# Patient Record
Sex: Female | Born: 1990
Health system: Southern US, Community
[De-identification: ages and names within clinical notes are randomized; demographics above are authoritative.]

## PROBLEM LIST (undated history)

## (undated) DIAGNOSIS — E1065 Type 1 diabetes mellitus with hyperglycemia: Secondary | ICD-10-CM

## (undated) DIAGNOSIS — Z8669 Personal history of other diseases of the nervous system and sense organs: Secondary | ICD-10-CM

## (undated) DIAGNOSIS — F329 Major depressive disorder, single episode, unspecified: Secondary | ICD-10-CM

## (undated) DIAGNOSIS — R296 Repeated falls: Secondary | ICD-10-CM

## (undated) DIAGNOSIS — K760 Fatty (change of) liver, not elsewhere classified: Secondary | ICD-10-CM

## (undated) DIAGNOSIS — G629 Polyneuropathy, unspecified: Secondary | ICD-10-CM

## (undated) DIAGNOSIS — B009 Herpesviral infection, unspecified: Secondary | ICD-10-CM

## (undated) DIAGNOSIS — L089 Local infection of the skin and subcutaneous tissue, unspecified: Secondary | ICD-10-CM

## (undated) DIAGNOSIS — G4700 Insomnia, unspecified: Secondary | ICD-10-CM

## (undated) DIAGNOSIS — E1140 Type 2 diabetes mellitus with diabetic neuropathy, unspecified: Secondary | ICD-10-CM

## (undated) DIAGNOSIS — M21969 Unspecified acquired deformity of unspecified lower leg: Secondary | ICD-10-CM

## (undated) DIAGNOSIS — M25561 Pain in right knee: Secondary | ICD-10-CM

## (undated) DIAGNOSIS — E785 Hyperlipidemia, unspecified: Secondary | ICD-10-CM

## (undated) DIAGNOSIS — F419 Anxiety disorder, unspecified: Secondary | ICD-10-CM

## (undated) DIAGNOSIS — G8929 Other chronic pain: Secondary | ICD-10-CM

## (undated) HISTORY — DX: Herpesviral infection, unspecified: B00.9

## (undated) HISTORY — DX: Type 2 diabetes mellitus with diabetic neuropathy, unspecified: E11.40

## (undated) HISTORY — PX: LEEP: SHX91

## (undated) HISTORY — DX: Polyneuropathy, unspecified: G62.9

## (undated) HISTORY — DX: Insomnia, unspecified: G47.00

## (undated) HISTORY — DX: Major depressive disorder, single episode, unspecified: F32.9

## (undated) HISTORY — DX: Type 1 diabetes mellitus with hyperglycemia: E10.65

## (undated) HISTORY — DX: Hyperlipidemia, unspecified: E78.5

## (undated) HISTORY — DX: Anxiety disorder, unspecified: F41.9

## (undated) HISTORY — DX: Local infection of the skin and subcutaneous tissue, unspecified: L08.9

## (undated) HISTORY — DX: Personal history of other diseases of the nervous system and sense organs: Z86.69

## (undated) HISTORY — DX: Repeated falls: R29.6

---
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
## 2011-03-01 ENCOUNTER — Encounter (HOSPITAL_COMMUNITY): Payer: Medicaid Other | Admitting: Psychiatry

## 2011-11-01 ENCOUNTER — Encounter (HOSPITAL_COMMUNITY): Payer: Self-pay | Admitting: *Deleted

## 2011-11-01 ENCOUNTER — Emergency Department (HOSPITAL_COMMUNITY)
Admission: EM | Admit: 2011-11-01 | Discharge: 2011-11-01 | Disposition: A | Discharge status: Home / Self Care | Payer: Self-pay | Attending: Emergency Medicine | Admitting: Emergency Medicine

## 2011-11-01 DIAGNOSIS — E7800 Pure hypercholesterolemia, unspecified: Secondary | ICD-10-CM | POA: Insufficient documentation

## 2011-11-01 DIAGNOSIS — R21 Rash and other nonspecific skin eruption: Secondary | ICD-10-CM | POA: Insufficient documentation

## 2011-11-01 DIAGNOSIS — Z79899 Other long term (current) drug therapy: Secondary | ICD-10-CM | POA: Insufficient documentation

## 2011-11-01 DIAGNOSIS — Z794 Long term (current) use of insulin: Secondary | ICD-10-CM | POA: Insufficient documentation

## 2011-11-01 DIAGNOSIS — L989 Disorder of the skin and subcutaneous tissue, unspecified: Secondary | ICD-10-CM | POA: Insufficient documentation

## 2011-11-01 DIAGNOSIS — E119 Type 2 diabetes mellitus without complications: Secondary | ICD-10-CM | POA: Insufficient documentation

## 2011-11-01 HISTORY — DX: Fatty (change of) liver, not elsewhere classified: K76.0

## 2011-11-01 HISTORY — DX: Unspecified acquired deformity of unspecified lower leg: M21.969

## 2011-11-01 LAB — COMPREHENSIVE METABOLIC PANEL
ALT: 26 U/L (ref 0–35)
AST: 24 U/L (ref 0–37)
Calcium: 9.2 mg/dL (ref 8.4–10.5)
Potassium: 3.8 mEq/L (ref 3.5–5.1)
Sodium: 135 mEq/L (ref 135–145)
Total Protein: 7.3 g/dL (ref 6.0–8.3)

## 2011-11-01 LAB — GLUCOSE, CAPILLARY: Glucose-Capillary: 111 mg/dL — ABNORMAL HIGH (ref 70–99)

## 2011-11-01 MED ORDER — MINOCYCLINE HCL 100 MG PO CAPS: 100.0000 mg | ORAL_CAPSULE | Freq: Two times a day (BID) | ORAL | Status: DC

## 2011-11-01 NOTE — Discharge Instructions (Signed)
Rash A rash is a change in the color or texture of your skin. There are many different types of rashes. You may have other problems that accompany your rash. CAUSES   Infections.   Allergic reactions. This can include allergies to pets or foods.   Certain medicines.   Exposure to certain chemicals, soaps, or cosmetics.   Heat.   Exposure to poisonous plants.   Tumors, both cancerous and noncancerous.  SYMPTOMS   Redness.   Scaly skin.   Itchy skin.   Dry or cracked skin.   Bumps.   Blisters.   Pain.  DIAGNOSIS  Your caregiver may do a physical exam to determine what type of rash you have. A skin sample (biopsy) may be taken and examined under a microscope. TREATMENT  Treatment depends on the type of rash you have. Your caregiver may prescribe certain medicines. For serious conditions, you may need to see a skin doctor (dermatologist). HOME CARE INSTRUCTIONS   Avoid the substance that caused your rash.   Do not scratch your rash. This can cause infection.   You may take cool baths to help stop itching.   Only take over-the-counter or prescription medicines as directed by your caregiver.   Keep all follow-up appointments as directed by your caregiver.  SEEK IMMEDIATE MEDICAL CARE IF:  You have increasing pain, swelling, or redness.   You have a fever.   You have new or severe symptoms.   You have body aches, diarrhea, or vomiting.   Your rash is not better after 3 days.  MAKE SURE YOU:  Understand these instructions.   Will watch your condition.   Will get help right away if you are not doing well or get worse.  Document Released: 08/03/2002 Document Revised: 08/02/2011 Document Reviewed: 05/28/2011 [employer] Patient Information 2012 [employer]. 

## 2011-11-01 NOTE — ED Notes (Addendum)
Pt has diabetes and skin lesions covering body  Time line -2 years ago skin lesions started on legs. Sometimes lesions it, normally just scab, no pus, and then heal. -1.5 years ago treated for DKA.  Currently AIC 11% (checked Jan 2013), pt checks blood sugar at least 5 times daily -1.5 years ago pt went to wound care center, one leg treated with uma boot. Lesions started to heal/ get smaller, but new ones still showed up. So pt referred to dermatologist, dermatologist took scab scrapings, on one sample staff noted, but no other treatment.  -Sept 2012 pt liver biopsied, dx with fatty liver, pt was on cholesterol medicine, but taken off due to fatty liver -Since last 2 years lesions spread to arms -2 months ago skin lesions on face noted -x3 weeks pt has had mid epigastric pain at night that wakes her up from sleep and radiates to right. Only relieved when pt makes herself throw up -Last 2 days pt has had continuous back of head/upper neck pain. At times pain 10/10, currently 2/10. Pain has been constant with no relief, sometimes pain radiates to behind ears.   Skin lesions red and scabbed over, no pus or swelling noted.   Pt currently does not have insurance and so has had difficulty paying for treatment.

## 2011-11-01 NOTE — Progress Notes (Signed)
CM contacted by [facility] staff to discuss resources for medications, local self pcps, Mother states pt not eligible for medicaid since turned 12 but  Continues to work with DSS on resources.  Pt per d/c summary scheduled to see a dermatologist. CM encouraged mother to check with the dermatology office billing prior to pt appointment.  Cm reviewed [email or web address], local discount pharmacies, discounted gluco meters, [facility] outpatient pharmacy, local financial assistance resources and other [place] resources. Pt and mother voiced understanding and appreciation for services offered.. Pt listed as self pay with no insurance coverage Pt confirms she is self pay [place] resident.  CM and [facility] coordinator spoke with her Pt offered [facility] services to assist with finding a [place] self pay provider

## 2011-11-01 NOTE — ED Provider Notes (Signed)
History     CSN: [id number]  Arrival date & time 11/01/11  0919   First MD Initiated Contact with Patient 11/01/11 1017      Chief Complaint  Patient presents with  . Diabetes  . Skin Ulcer    skin lesions covering body    (Consider location/radiation/quality/duration/timing/severity/associated sxs/prior treatment) HPI  Patient presents to the ED with complaints of rash. The rash started two years ago on bilateral lower extremities, which she has seen a Wound Care doctor and dermatologist for. They tried " a couple of things" and the rash got better but never completely went away, then she lost her insurance. The patient describes the symptoms as progressively spreading. They then spread to her arms, and within the past week she has three spots on her face. The rash spares the bottom of her feet, calfs, buttocks, genitalia, back, chest, neck, right arm. She denies systemic symptoms of fever, chills, weakness, vaginal odor or discharge. Pt has PMH positive for diabetes.  Past Medical History  Diagnosis Date  . Diabetes mellitus   . High cholesterol   . Foot deformity, acquired     little toes, toes nail are falling off, needs surgery, but cant happen till AIC < 9  . Fatty liver   . Skin lesion     Past Surgical History  Procedure Date  . Wrist surgery     rerouted tendons in wrist    History reviewed. No pertinent family history.  History  Substance Use Topics  . Smoking status: Never Smoker   . Smokeless tobacco: Not on file  . Alcohol Use: No    OB History    Grav Para Term Preterm Abortions TAB SAB Ect Mult Living                  Review of Systems  All other systems reviewed and are negative.    Allergies  Penicillins  Home Medications   Current Outpatient Rx  Name Route Sig Dispense Refill  . ESOMEPRAZOLE MAGNESIUM 40 MG PO CPDR Oral Take 40 mg by mouth daily before breakfast.    . IBUPROFEN 200 MG PO TABS Oral Take 400 mg by mouth every 6 (six)  hours as needed. For pain    . INSULIN ASPART 100 UNIT/ML [place] SOLN Subcutaneous Inject 1-3 Units into the skin 3 (three) times daily before meals. Sliding scale using 1-3 units with each meal    . INSULIN GLARGINE 100 UNIT/ML [place] SOLN Subcutaneous Inject 50 Units into the skin at bedtime.    Marland Kitchen BACITRACIN-NEOMYCIN-POLYMYXIN [id number] EX OINT Topical Apply 1 application topically every 8 (eight) hours. Apply to affected areas on arms    . NORGESTREL-ETHINYL ESTRADIOL 0.3-30 MG-MCG PO TABS Oral Take 1 tablet by mouth daily.    . SERTRALINE HCL 100 MG PO TABS Oral Take 100 mg by mouth daily.    Marland Kitchen ZOLPIDEM TARTRATE 5 MG PO TABS Oral Take 5 mg by mouth at bedtime as needed.    Marland Kitchen MINOCYCLINE HCL 100 MG PO CAPS Oral Take 1 capsule (100 mg total) by mouth 2 (two) times daily. 28 capsule 0    BP 132/90  Pulse 109  Temp(Src) 98.5 F (36.9 C) (Oral)  Resp 16  SpO2 99%  LMP 09/23/2011  Physical Exam  Nursing note and vitals reviewed. Constitutional: She appears well-developed and well-nourished. No distress.  HENT:  Head: Normocephalic and atraumatic.    Eyes: Pupils are equal, round, and reactive to light.  Neck:  Normal range of motion. Neck supple.  Cardiovascular: Normal rate and regular rhythm.   Pulmonary/Chest: Effort normal.  Abdominal: Soft.  Musculoskeletal:       Arms:      Legs:      Lesions are scabbed and individual and non coalescing. Non itchy, slightly painful, many scars noted to areas where scabs have healed.  Neurological: She is alert.  Skin: Skin is warm and dry.    ED Course  Procedures (including critical care time)  Labs Reviewed  GLUCOSE, CAPILLARY - Abnormal; Notable for the following:    Glucose-Capillary 111 (*)    All other components within normal limits  POCT PREGNANCY, URINE  COMPREHENSIVE METABOLIC PANEL   No results found.   1. Rash       MDM  Pts rash possibly infectious etiology. Will try treating with minocycline 100mg  q 12 hours for 2  weeks. I did urine preg and CMP to chest patients hepatic and renal function. Patient and mother have been informed that their is a change this treatment may not work and that something else may need to be tried.       Dorthula Matas, PA 11/01/11 1946

## 2011-11-02 NOTE — ED Provider Notes (Signed)
Medical screening examination/treatment/procedure(s) were performed by non-physician practitioner and as supervising physician I was immediately available for consultation/collaboration.   Jacquelynne Guedes A. Patrica Duel, MD 11/02/11 1454

## 2011-11-30 ENCOUNTER — Emergency Department (HOSPITAL_COMMUNITY)
Admission: EM | Admit: 2011-11-30 | Discharge: 2011-11-30 | Disposition: A | Discharge status: Home / Self Care | Payer: Self-pay | Attending: Emergency Medicine | Admitting: Emergency Medicine

## 2011-11-30 ENCOUNTER — Encounter (HOSPITAL_COMMUNITY): Payer: Self-pay | Admitting: *Deleted

## 2011-11-30 DIAGNOSIS — L2089 Other atopic dermatitis: Secondary | ICD-10-CM | POA: Insufficient documentation

## 2011-11-30 DIAGNOSIS — E108 Type 1 diabetes mellitus with unspecified complications: Secondary | ICD-10-CM | POA: Insufficient documentation

## 2011-11-30 DIAGNOSIS — E7800 Pure hypercholesterolemia, unspecified: Secondary | ICD-10-CM | POA: Insufficient documentation

## 2011-11-30 DIAGNOSIS — R739 Hyperglycemia, unspecified: Secondary | ICD-10-CM

## 2011-11-30 DIAGNOSIS — Z794 Long term (current) use of insulin: Secondary | ICD-10-CM | POA: Insufficient documentation

## 2011-11-30 DIAGNOSIS — L209 Atopic dermatitis, unspecified: Secondary | ICD-10-CM

## 2011-11-30 LAB — GLUCOSE, CAPILLARY

## 2011-11-30 LAB — URINALYSIS, ROUTINE W REFLEX MICROSCOPIC
Bilirubin Urine: NEGATIVE
Nitrite: NEGATIVE
Specific Gravity, Urine: 1.039 — ABNORMAL HIGH (ref 1.005–1.030)
pH: 5.5 (ref 5.0–8.0)

## 2011-11-30 LAB — POCT I-STAT, CHEM 8
Creatinine, Ser: 0.6 mg/dL (ref 0.50–1.10)
Hemoglobin: 16.3 g/dL — ABNORMAL HIGH (ref 12.0–15.0)
Potassium: 3.7 mEq/L (ref 3.5–5.1)
Sodium: 135 mEq/L (ref 135–145)

## 2011-11-30 LAB — CBC
HCT: 44 % (ref 36.0–46.0)
Hemoglobin: 16 g/dL — ABNORMAL HIGH (ref 12.0–15.0)
RBC: 5.01 MIL/uL (ref 3.87–5.11)
WBC: 9.5 10*3/uL (ref 4.0–10.5)

## 2011-11-30 LAB — DIFFERENTIAL
Basophils Absolute: 0.1 10*3/uL (ref 0.0–0.1)
Lymphocytes Relative: 33 % (ref 12–46)
Lymphs Abs: 3.1 10*3/uL (ref 0.7–4.0)
Monocytes Absolute: 0.5 10*3/uL (ref 0.1–1.0)
Monocytes Relative: 6 % (ref 3–12)
Neutro Abs: 5.3 10*3/uL (ref 1.7–7.7)

## 2011-11-30 LAB — URINE MICROSCOPIC-ADD ON

## 2011-11-30 MED ORDER — MINOCYCLINE HCL 100 MG PO CAPS: 100.0000 mg | ORAL_CAPSULE | Freq: Two times a day (BID) | ORAL | Status: DC

## 2011-11-30 MED ORDER — ONDANSETRON HCL 4 MG/2ML IJ SOLN
4.0000 mg | Freq: Once | INTRAMUSCULAR | Status: AC
  Administered 2011-11-30: 4 mg via INTRAVENOUS
  Filled 2011-11-30: qty 2

## 2011-11-30 MED ORDER — SODIUM CHLORIDE 0.9 % IV BOLUS (SEPSIS)
1000.0000 mL | Freq: Once | INTRAVENOUS | Status: AC
  Administered 2011-11-30: 1000 mL via INTRAVENOUS

## 2011-11-30 NOTE — ED Provider Notes (Signed)
Medical screening examination/treatment/procedure(s) were conducted as a shared visit with non-physician practitioner(s) and myself.  I personally evaluated the patient during the encounter Pt is a 21 year old woman who is diabetic.  She came in with hyperglycemia, which was treated with IV fluids and insulin, with improvement of her blood sugar into the low 200's.  She has a rash scattered over her body, including the face and scale, that is dry with some scale on it.  I cultured one of these lesions.  Pt placed on minocycline 100 mg bid x 1 month for suspected impetigo. She is going to followup in the [facility].  Carleene Cooper III, MD 11/30/11 2126

## 2011-11-30 NOTE — Discharge Instructions (Signed)
Eczema Atopic dermatitis, or eczema, is an inherited type of sensitive skin. Often people with eczema have a family history of allergies, asthma, or hay fever. It causes a red itchy rash and dry scaly skin. The itchiness may occur before the skin rash and may be very intense. It is not contagious. Eczema is generally worse during the cooler winter months and often improves with the warmth of summer. Eczema usually starts showing signs in infancy. Some children outgrow eczema, but it may last through adulthood. Flare-ups may be caused by:  Eating something or contact with something you are sensitive or allergic to.   Stress.  DIAGNOSIS  The diagnosis of eczema is usually based upon symptoms and medical history. TREATMENT  Eczema cannot be cured, but symptoms usually can be controlled with treatment or avoidance of allergens (things to which you are sensitive or allergic to).  Controlling the itching and scratching.   Use over-the-counter antihistamines as directed for itching. It is especially useful at night when the itching tends to be worse.   Use over-the-counter steroid creams as directed for itching.   Scratching makes the rash and itching worse and may cause impetigo (a skin infection) if fingernails are contaminated (dirty).   Keeping the skin well moisturized with creams every day. This will seal in moisture and help prevent dryness. Lotions containing alcohol and water can dry the skin and are not recommended.   Limiting exposure to allergens.   Recognizing situations that cause stress.   Developing a plan to manage stress.  HOME CARE INSTRUCTIONS   Take prescription and over-the-counter medicines as directed by your caregiver.   Do not use anything on the skin without checking with your caregiver.   Keep baths or showers short (5 minutes) in warm (not hot) water. Use mild cleansers for bathing. You may add non-perfumed bath oil to the bath water. It is best to avoid soap and  bubble bath.   Immediately after a bath or shower, when the skin is still damp, apply a moisturizing ointment to the entire body. This ointment should be a petroleum ointment. This will seal in moisture and help prevent dryness. The thicker the ointment the better. These should be unscented.   Keep fingernails cut short and wash hands often. If your child has eczema, it may be necessary to put soft gloves or mittens on your child at night.   Dress in clothes made of cotton or cotton blends. Dress lightly, as heat increases itching.   Avoid foods that may cause flare-ups. Common foods include cow's milk, peanut butter, eggs and wheat.   Keep a child with eczema away from anyone with fever blisters. The virus that causes fever blisters (herpes simplex) can cause a serious skin infection in children with eczema.  SEEK MEDICAL CARE IF:   Itching interferes with sleep.   The rash gets worse or is not better within one week following treatment.   The rash looks infected (pus or soft yellow scabs).   You or your child has an oral temperature above 102 F (38.9 C).   Your baby is older than 3 months with a rectal temperature of 100.5 F (38.1 C) or higher for more than 1 day.   The rash flares up after contact with someone who has fever blisters.  SEEK IMMEDIATE MEDICAL CARE IF:   Your baby is older than 3 months with a rectal temperature of 102 F (38.9 C) or higher.   Your baby is older than  3 months or younger with a rectal temperature of 100.4 F (38 C) or higher.  Document Released: 08/10/2000 Document Revised: 08/02/2011 Document Reviewed: 06/15/2009 [facility] Patient Information 2012 [place], [place].Hyperglycemia Hyperglycemia occurs when the glucose (sugar) in your blood is too high. Hyperglycemia can happen for many reasons, but it most often happens to people who do not know they have diabetes or are not managing their diabetes properly.  CAUSES  Whether you have diabetes or  not, there are other causes of hyperglycemia. Hyperglycemia can occur when you have diabetes, but it can also occur in other situations that you might not be as aware of, such as: Diabetes  If you have diabetes and are having problems controlling your blood glucose, hyperglycemia could occur because of some of the following reasons:   Not following your meal plan.   Not taking your diabetes medications or not taking it properly.   Exercising less or doing less activity than you normally do.   Being sick.  Pre-diabetes  This cannot be ignored. Before people develop Type 2 diabetes, they almost always have "pre-diabetes." This is when your blood glucose levels are higher than normal, but not yet high enough to be diagnosed as diabetes. Research has shown that some long-term damage to the body, especially the heart and circulatory system, may already be occurring during pre-diabetes. If you take action to manage your blood glucose when you have pre-diabetes, you may delay or prevent Type 2 diabetes from developing.  Stress  If you have diabetes, you may be "diet" controlled or on oral medications or insulin to control your diabetes. However, you may find that your blood glucose is higher than usual in the hospital whether you have diabetes or not. This is often referred to as "stress hyperglycemia." Stress can elevate your blood glucose. This happens because of hormones put out by the body during times of stress. If stress has been the cause of your high blood glucose, it can be followed regularly by your caregiver. That way he/she can make sure your hyperglycemia does not continue to get worse or progress to diabetes.  Steroids  Steroids are medications that act on the infection fighting system (immune system) to block inflammation or infection. One side effect can be a rise in blood glucose. Most people can produce enough extra insulin to allow for this rise, but for those who cannot, steroids  make blood glucose levels go even higher. It is not unusual for steroid treatments to "uncover" diabetes that is developing. It is not always possible to determine if the hyperglycemia will go away after the steroids are stopped. A special blood test called an A1c is sometimes done to determine if your blood glucose was elevated before the steroids were started.  SYMPTOMS  Thirsty.   Frequent urination.   Dry mouth.   Blurred vision.   Tired or fatigue.   Weakness.   Sleepy.   Tingling in feet or leg.  DIAGNOSIS  Diagnosis is made by monitoring blood glucose in one or all of the following ways:  A1c test. This is a chemical found in your blood.   Fingerstick blood glucose monitoring.   Laboratory results.  TREATMENT  First, knowing the cause of the hyperglycemia is important before the hyperglycemia can be treated. Treatment may include, but is not be limited to:  Education.   Change or adjustment in medications.   Change or adjustment in meal plan.   Treatment for an illness, infection, etc.   More  frequent blood glucose monitoring.   Change in exercise plan.   Decreasing or stopping steroids.   Lifestyle changes.  HOME CARE INSTRUCTIONS   Test your blood glucose as directed.   Exercise regularly. Your caregiver will give you instructions about exercise. Pre-diabetes or diabetes which comes on with stress is helped by exercising.   Eat wholesome, balanced meals. Eat often and at regular, fixed times. Your caregiver or nutritionist will give you a meal plan to guide your sugar intake.   Being at an ideal weight is important. If needed, losing as little as 10 to 15 pounds may help improve blood glucose levels.  SEEK MEDICAL CARE IF:   You have questions about medicine, activity, or diet.   You continue to have symptoms (problems such as increased thirst, urination, or weight gain).  SEEK IMMEDIATE MEDICAL CARE IF:   You are vomiting or have diarrhea.    Your breath smells fruity.   You are breathing faster or slower.   You are very sleepy or incoherent.   You have numbness, tingling, or pain in your feet or hands.   You have chest pain.   Your symptoms get worse even though you have been following your caregiver's orders.   If you have any other questions or concerns.  Document Released: 02/06/2001 Document Revised: 08/02/2011 Document Reviewed: 04/04/2009 [facility] Patient Information 2012 [place], [place].

## 2011-11-30 NOTE — ED Notes (Signed)
To ED for eval of high blood sugars and an ongoing outbreak of lesions to body. Dry mouth, excessively tired, freq urination. Pt is soon starting as a pt with [facility], has appt in may.

## 2011-11-30 NOTE — ED Notes (Signed)
Pt. Is aware of needing urine specimen.   

## 2011-11-30 NOTE — ED Provider Notes (Signed)
History     CSN: [id number]  Arrival date & time 11/30/11  1001   First MD Initiated Contact with Patient 11/30/11 1032      Chief Complaint  Patient presents with  . Hyperglycemia    (Consider location/radiation/quality/duration/timing/severity/associated sxs/prior treatment) HPI Comments: Patient here with mother with several complaints - reports very brittle diabetic who is currently discharged from her PCP but has been accepted at the [facility] and has an appointment with them in May - states that blood sugar was >500 this morning - she took 13 units of novolog to cover this.  Reports dry mouth and nausea, denies vomiting or abdominal pain.  Reports fatique and excessive thirst and urination recently.  States HgbA1C has never been below 9.3.  Also with complaints of skin rash - has been going on for the past 2 years - mother states they have been to multiple providers without a real diagnosis.  Is here with worsening pain to the right arm with redness and mother is concerned that the plaque lesions that itch are infected which they have been in the past - denies fever, chills, drainage.  Patient is a 21 y.o. female presenting with diabetes problem. The history is provided by a parent and the patient. No language interpreter was used.  Diabetes She presents for her follow-up diabetic visit. She has type 1 diabetes mellitus. No MedicAlert identification noted. Her disease course has been fluctuating. There are no hypoglycemic associated symptoms. Associated symptoms include fatigue, polydipsia, polyuria and weakness. Pertinent negatives for diabetes include no blurred vision, no chest pain, no foot paresthesias, no foot ulcerations, no polyphagia, no visual change and no weight loss. There are no hypoglycemic complications. Symptoms are worsening. Diabetic complications include peripheral neuropathy. Pertinent negatives for diabetic complications include no autonomic neuropathy, CVA, heart disease,  impotence, nephropathy, PVD or retinopathy. Risk factors for coronary artery disease include diabetes mellitus, dyslipidemia and obesity. Current diabetic treatment includes insulin injections. She is compliant with treatment all of the time. Her weight is stable. She is following a generally healthy diet. When asked about meal planning, she reported none. She has not had a previous visit with a dietician. She participates in exercise intermittently. Her home blood glucose trend is increasing rapidly. Her breakfast blood glucose is taken between 8-9 am. Her breakfast blood glucose range is generally >200 mg/dl. She does not see a podiatrist.Eye exam is not current.    Past Medical History  Diagnosis Date  . Diabetes mellitus   . High cholesterol   . Foot deformity, acquired     little toes, toes nail are falling off, needs surgery, but cant happen till AIC < 9  . Fatty liver   . Skin lesion     Past Surgical History  Procedure Date  . Wrist surgery     rerouted tendons in wrist    History reviewed. No pertinent family history.  History  Substance Use Topics  . Smoking status: Never Smoker   . Smokeless tobacco: Not on file  . Alcohol Use: No    OB History    Grav Para Term Preterm Abortions TAB SAB Ect Mult Living                  Review of Systems  Constitutional: Positive for fatigue. Negative for fever, chills and weight loss.  HENT: Negative for ear pain and neck pain.   Eyes: Negative for blurred vision and pain.  Respiratory: Negative for chest tightness and shortness of  breath.   Cardiovascular: Negative for chest pain.  Gastrointestinal: Positive for nausea. Negative for vomiting, abdominal pain and diarrhea.  Genitourinary: Positive for polyuria and frequency. Negative for flank pain, impotence and difficulty urinating.  Skin: Positive for rash.  Neurological: Positive for weakness.  Hematological: Positive for polydipsia. Negative for polyphagia.  All other  systems reviewed and are negative.    Allergies  Penicillins  Home Medications   Current Outpatient Rx  Name Route Sig Dispense Refill  . ESOMEPRAZOLE MAGNESIUM 40 MG PO CPDR Oral Take 40 mg by mouth 2 (two) times daily.     . IBUPROFEN 200 MG PO TABS Oral Take 400 mg by mouth every 6 (six) hours as needed. For pain    . INSULIN ASPART 100 UNIT/ML [place] SOLN Subcutaneous Inject 1-3 Units into the skin 3 (three) times daily before meals. Sliding scale using 1-3 units with each meal    . INSULIN GLARGINE 100 UNIT/ML [place] SOLN Subcutaneous Inject 50 Units into the skin at bedtime.    Marland Kitchen MINOCYCLINE HCL 100 MG PO CAPS Oral Take 100 mg by mouth 2 (two) times daily.    Marland Kitchen BACITRACIN-NEOMYCIN-POLYMYXIN 400-12-4998 EX OINT Topical Apply 1 application topically at bedtime. Apply to affected areas on arms    . NORGESTREL-ETHINYL ESTRADIOL 0.3-30 MG-MCG PO TABS Oral Take 1 tablet by mouth daily.    Marland Kitchen OVER THE COUNTER MEDICATION Oral Take 1 tablet by mouth daily. Chromax Plus .    Marland Kitchen SERTRALINE HCL 100 MG PO TABS Oral Take 100 mg by mouth daily.    Marland Kitchen ZOLPIDEM TARTRATE 5 MG PO TABS Oral Take 5 mg by mouth at bedtime as needed. For insomnia.      BP 124/86  Pulse 124  Temp(Src) 97.4 F (36.3 C) (Oral)  Resp 18  SpO2 98%  LMP 09/23/2011  Physical Exam  Nursing note and vitals reviewed. Constitutional: She is oriented to person, place, and time. She appears well-developed and well-nourished. No distress.  HENT:  Head: Normocephalic and atraumatic.  Right Ear: External ear normal.  Left Ear: External ear normal.  Nose: Nose normal.  Mouth/Throat: No oropharyngeal exudate.       Dry mucous membranes  Eyes: Conjunctivae are normal. Pupils are equal, round, and reactive to light. No scleral icterus.  Neck: Normal range of motion. Neck supple.  Cardiovascular: Normal rate, regular rhythm and normal heart sounds.  Exam reveals no gallop and no friction rub.   No murmur heard. Pulmonary/Chest:  Effort normal and breath sounds normal. No respiratory distress. She has no wheezes. She has no rales. She exhibits no tenderness.  Abdominal: Soft. Bowel sounds are normal. She exhibits no distension and no mass. There is no tenderness. There is no rebound and no guarding.  Musculoskeletal: Normal range of motion. She exhibits no edema and no tenderness.  Lymphadenopathy:    She has no cervical adenopathy.  Neurological: She is alert and oriented to person, place, and time. No cranial nerve deficit.  Skin: Skin is warm and dry. Rash noted. There is erythema. No pallor.       Patient with multiple plaque like scabbed area to bilateral arms, face, bilateral legs and one lesion to back.    ED Course  Procedures (including critical care time)  Labs Reviewed  GLUCOSE, CAPILLARY - Abnormal; Notable for the following:    Glucose-Capillary 503 (*)    All other components within normal limits  CBC  DIFFERENTIAL  URINALYSIS, ROUTINE W REFLEX MICROSCOPIC  PREGNANCY,  URINE   No results found.  Results for orders placed during the hospital encounter of 11/30/11  GLUCOSE, CAPILLARY      Component Value Range   Glucose-Capillary 503 (*) 70 - 99 (mg/dL)   Comment 1 Notify RN    CBC      Component Value Range   WBC 9.5  4.0 - 10.5 (K/uL)   RBC 5.01  3.87 - 5.11 (MIL/uL)   Hemoglobin 16.0 (*) 12.0 - 15.0 (g/dL)   HCT 16.1  09.6 - 04.5 (%)   MCV 87.8  78.0 - 100.0 (fL)   MCH 31.9  26.0 - 34.0 (pg)   MCHC 36.4 (*) 30.0 - 36.0 (g/dL)   RDW 40.9  81.1 - 91.4 (%)   Platelets 288  150 - 400 (K/uL)  DIFFERENTIAL      Component Value Range   Neutrophils Relative 56  43 - 77 (%)   Neutro Abs 5.3  1.7 - 7.7 (K/uL)   Lymphocytes Relative 33  12 - 46 (%)   Lymphs Abs 3.1  0.7 - 4.0 (K/uL)   Monocytes Relative 6  3 - 12 (%)   Monocytes Absolute 0.5  0.1 - 1.0 (K/uL)   Eosinophils Relative 5  0 - 5 (%)   Eosinophils Absolute 0.5  0.0 - 0.7 (K/uL)   Basophils Relative 1  0 - 1 (%)   Basophils  Absolute 0.1  0.0 - 0.1 (K/uL)  URINALYSIS, ROUTINE W REFLEX MICROSCOPIC      Component Value Range   Color, Urine YELLOW  YELLOW    APPearance CLEAR  CLEAR    Specific Gravity, Urine 1.039 (*) 1.005 - 1.030    pH 5.5  5.0 - 8.0    Glucose, UA >1000 (*) NEGATIVE (mg/dL)   Hgb urine dipstick MODERATE (*) NEGATIVE    Bilirubin Urine NEGATIVE  NEGATIVE    Ketones, ur 40 (*) NEGATIVE (mg/dL)   Protein, ur NEGATIVE  NEGATIVE (mg/dL)   Urobilinogen, UA 0.2  0.0 - 1.0 (mg/dL)   Nitrite NEGATIVE  NEGATIVE    Leukocytes, UA NEGATIVE  NEGATIVE   PREGNANCY, URINE      Component Value Range   Preg Test, Ur NEGATIVE  NEGATIVE   POCT I-STAT, CHEM 8      Component Value Range   Sodium 135  135 - 145 (mEq/L)   Potassium 3.7  3.5 - 5.1 (mEq/L)   Chloride 103  96 - 112 (mEq/L)   BUN 9  6 - 23 (mg/dL)   Creatinine, Ser 7.82  0.50 - 1.10 (mg/dL)   Glucose, Bld 956 (*) 70 - 99 (mg/dL)   Calcium, Ion 2.13  0.86 - 1.32 (mmol/L)   TCO2 18  0 - 100 (mmol/L)   Hemoglobin 16.3 (*) 12.0 - 15.0 (g/dL)   HCT 57.8 (*) 46.9 - 46.0 (%)  GLUCOSE, CAPILLARY      Component Value Range   Glucose-Capillary 216 (*) 70 - 99 (mg/dL)  URINE MICROSCOPIC-ADD ON      Component Value Range   Squamous Epithelial / LPF FEW (*) RARE    WBC, UA 0-2  <3 (WBC/hpf)   Bacteria, UA RARE  RARE    No results found.   Ectopic Dermatitis Hyperglycemia    MDM  Blood sugar down to 216 after the administration of IV fluids here - no evidence of systemic infection noted on exam or labs - plan to call [facility] and see if we can get patient seen in their clinic  sooner than May.          Izola Price [place], [place] 11/30/11 1358  Sp[facility] with [facility] and the patient will be calling [phone] and speaking to [place] and they will try to up her appointment.  Izola Price [place], [place] 11/30/11 1424

## 2011-12-03 LAB — WOUND CULTURE

## 2011-12-14 ENCOUNTER — Encounter: Payer: Self-pay | Admitting: Internal Medicine

## 2011-12-18 ENCOUNTER — Encounter: Payer: Self-pay | Admitting: Internal Medicine

## 2011-12-18 ENCOUNTER — Ambulatory Visit (INDEPENDENT_AMBULATORY_CARE_PROVIDER_SITE_OTHER): Payer: Self-pay | Admitting: Internal Medicine

## 2011-12-18 VITALS — BP 118/84 | HR 105 | Temp 97.0°F | Wt 158.6 lb

## 2011-12-18 DIAGNOSIS — L281 Prurigo nodularis: Secondary | ICD-10-CM | POA: Insufficient documentation

## 2011-12-18 DIAGNOSIS — E109 Type 1 diabetes mellitus without complications: Secondary | ICD-10-CM

## 2011-12-18 DIAGNOSIS — Z79899 Other long term (current) drug therapy: Secondary | ICD-10-CM

## 2011-12-18 DIAGNOSIS — E1065 Type 1 diabetes mellitus with hyperglycemia: Secondary | ICD-10-CM

## 2011-12-18 DIAGNOSIS — E785 Hyperlipidemia, unspecified: Secondary | ICD-10-CM

## 2011-12-18 DIAGNOSIS — L089 Local infection of the skin and subcutaneous tissue, unspecified: Secondary | ICD-10-CM

## 2011-12-18 HISTORY — DX: Local infection of the skin and subcutaneous tissue, unspecified: L08.9

## 2011-12-18 LAB — URINALYSIS, ROUTINE W REFLEX MICROSCOPIC
Glucose, UA: >1000 mg/dL — AB
Hgb urine dipstick: NEGATIVE
Ketones, ur: >80 mg/dL — AB
Leukocytes, UA: NEGATIVE
Nitrite: NEGATIVE
Specific Gravity, Urine: 1.043 — ABNORMAL HIGH (ref 1.005–1.030)
pH: 5.5 (ref 5.0–8.0)

## 2011-12-18 LAB — CBC WITH DIFFERENTIAL/PLATELET
Basophils Absolute: 0.1 10*3/uL (ref 0.0–0.1)
Basophils Relative: 1 % (ref 0–1)
Lymphocytes Relative: 29 % (ref 12–46)
MCHC: 35.8 g/dL (ref 30.0–36.0)
Neutro Abs: 5 10*3/uL (ref 1.7–7.7)
Neutrophils Relative %: 63 % (ref 43–77)
Platelets: 288 10*3/uL (ref 150–400)
RDW: 11.9 % (ref 11.5–15.5)
WBC: 8 10*3/uL (ref 4.0–10.5)

## 2011-12-18 LAB — RPR

## 2011-12-18 LAB — BASIC METABOLIC PANEL WITH GFR
GFR, Est African American: >89 mL/min
GFR, Est Non African American: >89 mL/min
Potassium: 4.1 mEq/L (ref 3.5–5.3)
Sodium: 133 mEq/L — ABNORMAL LOW (ref 135–145)

## 2011-12-18 LAB — HEPATIC FUNCTION PANEL
ALT: 22 U/L (ref 0–35)
AST: 19 U/L (ref 0–37)
Albumin: 3.8 g/dL (ref 3.5–5.2)
Total Bilirubin: 0.4 mg/dL (ref 0.3–1.2)

## 2011-12-18 LAB — TSH: TSH: 1.259 u[IU]/mL (ref 0.350–4.500)

## 2011-12-18 LAB — MICROALBUMIN / CREATININE URINE RATIO
Creatinine, Urine: 45.2 mg/dL
Microalb Creat Ratio: 12.4 mg/g (ref 0.0–30.0)
Microalb, Ur: 0.56 mg/dL (ref 0.00–1.89)

## 2011-12-18 LAB — URINALYSIS, MICROSCOPIC ONLY

## 2011-12-18 LAB — POCT GLYCOSYLATED HEMOGLOBIN (HGB A1C): Hemoglobin A1C: >14

## 2011-12-18 LAB — LIPID PANEL
Cholesterol: 341 mg/dL — ABNORMAL HIGH (ref 0–200)
HDL: 57 mg/dL (ref >39)
Triglycerides: 275 mg/dL — ABNORMAL HIGH (ref <150)

## 2011-12-18 NOTE — Assessment & Plan Note (Signed)
-   will obtain records from wound care center and [facility] dermatology - continue current Minocycline tx.

## 2011-12-18 NOTE — Patient Instructions (Addendum)
1. Follow up with me in next week 2. Follow up with DM educator Lupita Leash on the same day 3. Continue your current regimen  4. Will obtain medical records from wound care center, [facility] dermatology,  5. Obtain medical records from [place] [facility] regarding office note for liver biopsy.  6.  take your insulin as prescribed and continue to check  CBG at home. Should you developed any symptoms of Polydipsia,polyuria,nausea, vomiting or diarrhea, you should go to ED.  - will follow up in one week.

## 2011-12-18 NOTE — Progress Notes (Signed)
Patient ID: Christine Dunn, female   DOB: [birth year], 21 y.o.   MRN: [id number]  Subjective:   Patient ID: Christine Dunn female   DOB: 02/13/1991 21 y.o.   MRN: [id number]  HPI: [name] is a 21 y.o. young woman with PMH outlined below who presents to the clinic to establish the care.  She was discharged from her PCP due to insurance problem. She is here today to establish care with our clinic. Her mother is with her.  1. Poorly controlled DM She states that she has had type I DM for years and her Hg A1C was never below 9. She states that she has been compliant with her prescribed lantus 50 units QHS with Novolog meal coverage.  She reports that she checks her blood sugar daily and the numbers were always at 120's. However, she was told that her blood sugar were high at all times. She has had a recent ED visit on 11/30/11 when she was treated with IVF and insulin for hyperglycemia of CBG> 500.   She does not have her meter with her.   2. recurrent skin infection Patient states that she has had recurrent skin infection for past two years. She was once treated at a wound care center and subsequently referred to [place] [facility] who did not give her a definitive diagnosis. She was evaluated by ED doctor on 11/30/11 who obtained her wound culture and gave her a course of Minocycline ABX treatment. She is still taking minocycline. Of note, skin wound culture showed "Staphy aureus" which is sensitive to Minocycline(Tetracycline)   No headache, fever, or sore throat. No shortness of breath or dyspnea on exertion. No chest pain, chest pressure or palpitation No nausea, vomiting, or abdominal pain. No melena, diarrhea or incontinence. No muscle weakness.                   Denies depression. No appetite or weight changes.       Past Medical History  Diagnosis Date  . Diabetes mellitus   . High cholesterol   . Foot deformity, acquired     little toes, toes nail are falling off, needs  surgery, but cant happen till AIC < 9  . Fatty liver   . Skin lesion    Current Outpatient Prescriptions  Medication Sig Dispense Refill  . esomeprazole (NEXIUM) 40 MG capsule Take 40 mg by mouth 2 (two) times daily.       Marland Kitchen ibuprofen (ADVIL,MOTRIN) 200 MG tablet Take 400 mg by mouth every 6 (six) hours as needed. For pain      . insulin aspart (NOVOLOG) 100 UNIT/ML injection Inject 1-3 Units into the skin 3 (three) times daily before meals. Sliding scale using 1-3 units with each meal      . insulin glargine (LANTUS) 100 UNIT/ML injection Inject 50 Units into the skin at bedtime.      . minocycline (MINOCIN,DYNACIN) 100 MG capsule Take 100 mg by mouth 2 (two) times daily.      Marland Kitchen neomycin-bacitracin-polymyxin (NEOSPORIN) ointment Apply 1 application topically at bedtime. Apply to affected areas on arms      . norgestrel-ethinyl estradiol (LO/OVRAL,CRYSELLE) 0.3-30 MG-MCG tablet Take 1 tablet by mouth daily.      Marland Kitchen OVER THE COUNTER MEDICATION Take 1 tablet by mouth daily. Chromax Plus .      . sertraline (ZOLOFT) 100 MG tablet Take 100 mg by mouth daily.       No family history on  file. History   Social History  . Marital Status: Single    Spouse Name: N/A    Number of Children: N/A  . Years of Education: N/A   Social History Main Topics  . Smoking status: Never Smoker   . Smokeless tobacco: None  . Alcohol Use: No  . Drug Use: No  . Sexually Active:    Other Topics Concern  . None   Social History Narrative  . None   Review of Systems: See HPI Objective:  Physical Exam: Filed Vitals:   12/18/11 0951  BP: 118/84  Pulse: 105  Temp: 97 F (36.1 C)  TempSrc: Oral  Weight: 158 lb 9.6 oz (71.94 kg)   General: alert, well-developed, and cooperative to examination.  Head: normocephalic and atraumatic.  Eyes: vision grossly intact, pupils equal, pupils round, pupils reactive to light, no injection and anicteric.  Mouth: pharynx pink and moist, no erythema, and no  exudates.  Neck: supple, full ROM, no thyromegaly, no JVD, and no carotid bruits.  Lungs: normal respiratory effort, no accessory muscle use, normal breath sounds, no crackles, and no wheezes. Heart: normal rate, regular rhythm, no murmur, no gallop, and no rub.  Abdomen: soft, non-tender, normal bowel sounds, no distention, no guarding, no rebound tenderness, no hepatomegaly, and no splenomegaly.  Msk: no joint swelling, no joint warmth, and no redness over joints.  Pulses: 2+ DP/PT pulses bilaterally Extremities: No cyanosis, clubbing, edema Neurologic: alert & oriented X3, cranial nerves II-XII intact, strength normal in all extremities, sensation intact to light touch, and gait normal.  Skin: Patient with multiple plaque like scabbed area to bilateral arms, face, bilateral legs and one lesion to back. Her skin lesions are on the various healing stages. Psych: Oriented X3, memory intact for recent and remote, normally interactive, good eye contact, not anxious appearing, and not depressed appearing.   Assessment & Plan:

## 2011-12-18 NOTE — Assessment & Plan Note (Addendum)
Poorly controlled DM. Patient is asymptomatic. Denies any complaints.  Patient states that she has been compliant with her medical treatment.  She states that her meter always shows CBG of 120's, but she forgets to bring her meter to the clinic today.  - to r/o DKA    will obtain CBC with diff, CMP,Mg, Lipid panel, Hb A1C, UA, urine micro albumin/ Cr - will discuss with Dr. Aundria Rud about her lab results. Patient has Ag Gap of 19 with normal Bicarb of 21. Her UA showed Glucose > 1000 with ketone> 80. She is in the early stage of DKA with normal Bicarb. However, she has no symptoms and feels well. Dr. Aundria Rud has discussed with Endocrinologist Dr. Sharl Ma about patient condition. We will instruct her to take her insulin as prescribed and continue to check her CBG at home. Should she developed any symptoms of Polydipsia,polyuria,nausea, vomiting or diarrhea, she should go to ED.  - will follow up in one week.

## 2011-12-19 ENCOUNTER — Telehealth: Payer: Self-pay | Admitting: Dietician

## 2011-12-19 DIAGNOSIS — E785 Hyperlipidemia, unspecified: Secondary | ICD-10-CM | POA: Insufficient documentation

## 2011-12-19 HISTORY — DX: Hyperlipidemia, unspecified: E78.5

## 2011-12-19 LAB — HEPATITIS PANEL, ACUTE
Hep B C IgM: NEGATIVE
Hepatitis B Surface Ag: NEGATIVE

## 2011-12-19 NOTE — Assessment & Plan Note (Signed)
Patient is noted to have hyperlipidemia from her lab report.  - weight loss and low fat diet. - Will consider initiation of statin.

## 2011-12-20 NOTE — Telephone Encounter (Signed)
Discussed patient with Dr. Osvaldo Human to welcome to practice and encourage CDE appointment.

## 2011-12-24 ENCOUNTER — Telehealth: Payer: Self-pay | Admitting: Internal Medicine

## 2011-12-24 NOTE — Telephone Encounter (Signed)
I have made several phone calls to patient last week. Her mother picked up the phone and I left my pager number to her mother for her to give it to patient. And I did not receive any call from patient  I called back to patient's number,  and her mother answered the phone and told me that she would ask patient to call me asap. Again I did not receive any call from patient.  I have called her again today and left a message for her to call back to the clinic.

## 2011-12-25 ENCOUNTER — Ambulatory Visit (INDEPENDENT_AMBULATORY_CARE_PROVIDER_SITE_OTHER): Payer: Self-pay | Admitting: Dietician

## 2011-12-25 ENCOUNTER — Encounter: Payer: Self-pay | Admitting: Internal Medicine

## 2011-12-25 ENCOUNTER — Ambulatory Visit (INDEPENDENT_AMBULATORY_CARE_PROVIDER_SITE_OTHER): Payer: Self-pay | Admitting: Internal Medicine

## 2011-12-25 ENCOUNTER — Encounter: Payer: Self-pay | Admitting: Dietician

## 2011-12-25 ENCOUNTER — Encounter: Payer: Self-pay | Admitting: Licensed Clinical Social Worker

## 2011-12-25 VITALS — BP 119/91 | HR 92 | Temp 97.9°F | Wt 162.6 lb

## 2011-12-25 DIAGNOSIS — F329 Major depressive disorder, single episode, unspecified: Secondary | ICD-10-CM

## 2011-12-25 DIAGNOSIS — L089 Local infection of the skin and subcutaneous tissue, unspecified: Secondary | ICD-10-CM

## 2011-12-25 DIAGNOSIS — E785 Hyperlipidemia, unspecified: Secondary | ICD-10-CM

## 2011-12-25 DIAGNOSIS — F32A Depression, unspecified: Secondary | ICD-10-CM

## 2011-12-25 DIAGNOSIS — E1065 Type 1 diabetes mellitus with hyperglycemia: Secondary | ICD-10-CM

## 2011-12-25 HISTORY — DX: Depression, unspecified: F32.A

## 2011-12-25 LAB — GLUCOSE, CAPILLARY: Glucose-Capillary: 255 mg/dL — ABNORMAL HIGH (ref 70–99)

## 2011-12-25 MED ORDER — GLUCOSE BLOOD VI STRP: ORAL_STRIP | Status: DC

## 2011-12-25 MED ORDER — PRAVASTATIN SODIUM 20 MG PO TABS: 20.0000 mg | ORAL_TABLET | Freq: Every evening | ORAL | Status: DC

## 2011-12-25 MED ORDER — LISINOPRIL 2.5 MG PO TABS: 2.5000 mg | ORAL_TABLET | Freq: Every day | ORAL | Status: DC

## 2011-12-25 MED ORDER — ACCU-CHEK NANO SMARTVIEW W/DEVICE KIT: 1.0000 | PACK | Freq: Every day | Status: DC

## 2011-12-25 NOTE — Progress Notes (Signed)
Christine Dunn is a 21 y.o. female that is in today with her mother, Christine Dunn for a scheduled medical appointment.  Christine Dunn has been seeing a psychiatrist at [facility] from 2009-2012 for depression.  Christine Dunn referred to CSW for referral to psychiatrist.  CSW met with Christine Dunn and her mother.  Christine Dunn answers appropriately when questioned, but having to initiate conversations.  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  Christine Dunn has transportation and a supportive family.  Mother very talkative during assessment, CSW oftentimes has to redirect questions back to Christine Dunn.  Mother states Christine Dunn quit going to MD at [facility] [employer] due to d/c from [employer].  Christine Dunn does have [employer] orange card.  CSW provided information on [place] and [employer] [facility] [place].  Explained both work on Brewing technologist, but [employer] does have an intake fee of $22.50 for assessment.  Discussed [facility], Christine Dunn agreeable to referral.  CSW explained benefit of [facility] and education of diabetes management.  Christine Dunn would like mother's contact information given as [facility] contact.  CSW provided Christine Dunn/mother with HC-POA.  Informed Christine Dunn/mother to notify CSW when complete and CSW will make referral to [facility].  Christine Dunn to bring completed/notarized HC-POA to next appointment.

## 2011-12-25 NOTE — Progress Notes (Signed)
Patient ID: Christine Dunn, female   DOB: 02-22-1991, 21 y.o.   MRN: [id number] Patient ID: Christine Dunn, female   DOB: 04-12-1991, 21 y.o.   MRN: [id number]  Subjective:   Patient ID: Christine Dunn female   DOB: 14-Sep-1990 21 y.o.   MRN: [id number]  HPI: Christine Dunn is a 21 y.o. young woman with PMH outlined below who presents to the clinic for follow up since last office visit. She was recently discharged from her PCP due to insurance problem.    1. Poorly controlled DM She has had type I DM for years with Hg A1C never below 9.  She has had a recent ED visit on 11/30/11 when she was treated with IVF and insulin for hyperglycemia of CBG> 500. She states that she has been compliant with her prescribed lantus 50 units QHS with Novolog meal coverage. However, She reports meter malfunctioning which only shows CBG 120's. I did asked her to bring her malfunctioning meter today to test it at clinic. She brought a broken meter. Her CBG is 255 at clinic today.  2. recurrent skin infection   See my last office note on 12/18/11.  Her skin infection is better today.  3. Depression Patient and her mother state that she has had depression for years and has been treated by a psychiatrist during the beginning of her diagnosis. She has been on Zoloft 100 mg po daily for a long time. She states that she felt "lonely" and "singled out" because of her DM at school where she is only diabetic among her peers. She lost interest on a lot of daily/school activities and sleeps a lot due to above reason. She admits that she feels depressed at times. Denies SI/HI. She states that she lost her insurance and did not have enough financial support for her to continue psychiatric care.   No headache, fever, or sore throat. No shortness of breath or dyspnea on exertion. No chest pain, chest pressure or palpitation No nausea, vomiting, or abdominal pain. No melena, diarrhea or incontinence. No muscle weakness. Denies  depression. No appetite or weight changes.       Past Medical History  Diagnosis Date  . Diabetes mellitus   . High cholesterol   . Foot deformity, acquired     little toes, toes nail are falling off, needs surgery, but cant happen till AIC < 9  . Fatty liver   . Skin lesion    Current Outpatient Prescriptions  Medication Sig Dispense Refill  . Blood Glucose Monitoring Suppl (ACCU-CHEK NANO SMARTVIEW) W/DEVICE KIT 1 strip by Other route 6 (six) times daily. Use to test blood glucose up to six times daily. Dx: 250.03  1 kit  0  . esomeprazole (NEXIUM) 40 MG capsule Take 40 mg by mouth 2 (two) times daily.       Marland Kitchen glucose blood (ACCU-CHEK SMARTVIEW) test strip Use to test blood glucose up to six times daily. Dx 250.03  100 each  12  . ibuprofen (ADVIL,MOTRIN) 200 MG tablet Take 400 mg by mouth every 6 (six) hours as needed. For pain      . insulin aspart (NOVOLOG) 100 UNIT/ML injection Inject 1-3 Units into the skin 3 (three) times daily before meals. Injects 10 units for breakfast and lunch, 20-25 units for dinner, 10-15 units 1-2 times a day for snacks up to 75 total units per day.      . insulin glargine (LANTUS) 100 UNIT/ML injection Inject 50  Units into the skin at bedtime.      Marland Kitchen neomycin-bacitracin-polymyxin (NEOSPORIN) ointment Apply 1 application topically at bedtime. Apply to affected areas on arms      . norgestrel-ethinyl estradiol (LO/OVRAL,CRYSELLE) 0.3-30 MG-MCG tablet Take 1 tablet by mouth daily.      Marland Kitchen OVER THE COUNTER MEDICATION Take 1 tablet by mouth daily. Chromax Plus .      . sertraline (ZOLOFT) 100 MG tablet Take 100 mg by mouth daily.      Marland Kitchen lisinopril (ZESTRIL) 2.5 MG tablet Take 1 tablet (2.5 mg total) by mouth daily.  30 tablet  0  . pravastatin (PRAVACHOL) 20 MG tablet Take 1 tablet (20 mg total) by mouth every evening.  30 tablet  0   Family History  Problem Relation Age of Onset  . Diabetes Maternal Grandfather    History   Social History  .  Marital Status: Single    Spouse Name: N/A    Number of Children: N/A  . Years of Education: N/A   Social History Main Topics  . Smoking status: Never Smoker   . Smokeless tobacco: None  . Alcohol Use: No  . Drug Use: No  . Sexually Active:    Other Topics Concern  . None   Social History Narrative  . None   Review of Systems: See HPI Objective:  Physical Exam: Filed Vitals:   12/25/11 0821  BP: 119/91  Pulse: 92  Temp: 97.9 F (36.6 C)  TempSrc: Oral  Weight: 162 lb 9.6 oz (73.755 kg)   General: alert, well-developed, and cooperative to examination.  Head: normocephalic and atraumatic.  Eyes: vision grossly intact, pupils equal, pupils round, pupils reactive to light, no injection and anicteric.  Mouth: pharynx pink and moist, no erythema, and no exudates.  Neck: supple, full ROM, no thyromegaly, no JVD, and no carotid bruits.  Lungs: normal respiratory effort, no accessory muscle use, normal breath sounds, no crackles, and no wheezes. Heart: normal rate, regular rhythm, no murmur, no gallop, and no rub.  Abdomen: soft, non-tender, normal bowel sounds, no distention, no guarding, no rebound tenderness, no hepatomegaly, and no splenomegaly.  Msk: no joint swelling, no joint warmth, and no redness over joints.  Pulses: 2+ DP/PT pulses bilaterally Extremities: No cyanosis, clubbing, edema Neurologic: alert & oriented X3, cranial nerves II-XII intact, strength normal in all extremities, sensation intact to light touch, and gait normal.  Skin: Patient with multiple plaque like scabbed area to bilateral arms, face, bilateral legs and one lesion to back. Her skin lesions are on the various healing stages. Psych: Oriented X3, memory intact for recent and remote, normally interactive, good eye contact, not anxious appearing, and not depressed appearing.   Assessment & Plan:

## 2011-12-25 NOTE — Progress Notes (Signed)
Diabetes Self-Management Training (DSMT)  Initial Visit  12/25/2011 [name], identified by name and date of birth, is a 21 y.o. female with Type 1 Diabetes. Year of diabetes diagnosis: 1999 Other persons present: family member patient and mother  ASSESSMENT Patient concerns are mother talked more than patient, limited visit to assessment today.  There were no vitals taken for this visit. There is no height or weight on file to calculate BMI. Lab Results  Component Value Date   LDLCALC 229* 12/18/2011   Lab Results  Component Value Date   HGBA1C 13.7* 12/18/2011     Labs reviewed.  Family history of diabetes: Yes Support systems: parents Special needs: Unable to determine Prior DM Education: Yes Patients belief/attitude about diabetes: difficult to tell at this visits today- recently had worked A1C from 10% to 9% then 8. 4% at [facility].  Self foot exams daily: No Diabetes Complications: depression, hyperlipdemia   Medications See Medications list.  Needs skills/knowledge review   Exercise Plan  need to explore at future visit swithout mother present  .   Self-Monitoring Frequency of testing: patient reports 6 times/day Monitor: has an accu chek aviva prefers the accu check nano if she can get strips with copay card for 15$  Hyperglycemia: Yes Daily Hypoglycemia: Yes Weekly   Meal Planning Some knowledge and need review of carb counting skills-reports taking 10 units for 17 grams carb ths am.    Assessment comments: patientwas interactive and indicated a need for autonomy at visit today  INDIVIDUAL DIABETES EDUCATION PLAN:  Psychosocial adjustment _______________________________________________________________________  Intervention TOPICS COVERED TODAY:  Psychosocial adjustment  plan to assess patient needs in regards to helping her attain her desired amount of autonomy in her diabetes self care  PATIENTS GOALS/PLAN (copy and paste in patient instructions so  patient receives a copy): 1.  Learning Objective:       State knowledge of relationship of insulin and food 2.  Behavioral Objective:         Healthy Coping: For help with diabetes control, I will discuss diabetes with counselor  Never 0%  Personalized Follow-Up Plan for Ongoing Self Management Support:  Doctor's Office, family, internet communities, CDE visits and counselor ______________________________________________________________________   Outcomes Expected outcomes: Difficult to tell how receptive to training patient will be Self-care Barriers: Lack of transportation, Lack of material resources, Coping skills Education material provided: no Patient to contact team via Phone if problems or questions. Time in: 0930     Time out: 1030 Future DSMT - 2 wks   Christine Dunn, Christine Dunn   Suggest patient keep ketone strips on hand and glucagon emergency kit- both discussed today with mother present. Cannot get Ketone strips or glucagon through patient assistance. Request rx for glucagon emergency kit.

## 2011-12-25 NOTE — Patient Instructions (Signed)
Follow up with me in two weeks Will add lisinopril and pravachol to your regimen Will send referral to ophthalmology

## 2011-12-26 LAB — BASIC METABOLIC PANEL WITH GFR
Chloride: 103 mEq/L (ref 96–112)
GFR, Est African American: >89 mL/min
GFR, Est Non African American: >89 mL/min
Potassium: 3.9 mEq/L (ref 3.5–5.3)
Sodium: 137 mEq/L (ref 135–145)

## 2011-12-26 LAB — URINALYSIS, ROUTINE W REFLEX MICROSCOPIC
Ketones, ur: NEGATIVE mg/dL
Leukocytes, UA: NEGATIVE
Nitrite: NEGATIVE
Specific Gravity, Urine: >1.03 — ABNORMAL HIGH (ref 1.005–1.030)
Urobilinogen, UA: 0.2 mg/dL (ref 0.0–1.0)

## 2011-12-26 LAB — URINALYSIS, MICROSCOPIC ONLY
Casts: NONE SEEN
Crystals: NONE SEEN

## 2011-12-27 NOTE — Assessment & Plan Note (Signed)
See HPI  - will need to send Psychiatry referral - Sw consult - continue current treatment for now

## 2011-12-27 NOTE — Assessment & Plan Note (Addendum)
Resolving.  - she finished the course of Minocycline. - will continue to monitor.

## 2011-12-27 NOTE — Assessment & Plan Note (Signed)
See HPI. Patient admits that does not do good with her DM diet. She also skips her Novolog meal coverage. However, she reports compliance with her Lantus insulin.   - BG 255 today.  -  I spent over 30 minutes discussing about the importance of close management of her DM and medical compliance.  - she will also see DM educator Lupita Leash today.  - will follow her closely.

## 2011-12-27 NOTE — Assessment & Plan Note (Signed)
She was on lipitor in the past. She reports insomnia from lipitor and would like to have a different agent.  - will start her on pravachol 20 mg po daily. If she tolerates well, will increase to 40 mg po daily.

## 2012-01-07 ENCOUNTER — Encounter: Payer: Self-pay | Admitting: Internal Medicine

## 2012-01-08 ENCOUNTER — Encounter: Payer: Self-pay | Admitting: Internal Medicine

## 2012-01-08 ENCOUNTER — Ambulatory Visit (INDEPENDENT_AMBULATORY_CARE_PROVIDER_SITE_OTHER): Payer: Self-pay | Admitting: Internal Medicine

## 2012-01-08 VITALS — BP 127/88 | HR 116 | Temp 98.5°F | Wt 166.3 lb

## 2012-01-08 DIAGNOSIS — L089 Local infection of the skin and subcutaneous tissue, unspecified: Secondary | ICD-10-CM

## 2012-01-08 DIAGNOSIS — E785 Hyperlipidemia, unspecified: Secondary | ICD-10-CM

## 2012-01-08 DIAGNOSIS — E1140 Type 2 diabetes mellitus with diabetic neuropathy, unspecified: Secondary | ICD-10-CM | POA: Insufficient documentation

## 2012-01-08 DIAGNOSIS — F329 Major depressive disorder, single episode, unspecified: Secondary | ICD-10-CM

## 2012-01-08 DIAGNOSIS — G589 Mononeuropathy, unspecified: Secondary | ICD-10-CM

## 2012-01-08 DIAGNOSIS — G629 Polyneuropathy, unspecified: Secondary | ICD-10-CM

## 2012-01-08 DIAGNOSIS — E1065 Type 1 diabetes mellitus with hyperglycemia: Secondary | ICD-10-CM

## 2012-01-08 HISTORY — DX: Type 2 diabetes mellitus with diabetic neuropathy, unspecified: E11.40

## 2012-01-08 MED ORDER — GABAPENTIN 300 MG PO CAPS: 300.0000 mg | ORAL_CAPSULE | Freq: Every day | ORAL | Status: DC

## 2012-01-08 MED ORDER — INSULIN GLARGINE 100 UNIT/ML ~~LOC~~ SOLN: 55.0000 [IU] | Freq: Every day | SUBCUTANEOUS | Status: DC

## 2012-01-08 MED ORDER — CHLORHEXIDINE GLUCONATE 20 % SOLN: Status: DC

## 2012-01-08 NOTE — Assessment & Plan Note (Signed)
Bilateral numbness and burning sensation in her feet more prominent at night. We'll start patient on gabapentin for symptom relief.

## 2012-01-08 NOTE — Assessment & Plan Note (Signed)
Lab Results  Component Value Date   HGBA1C 13.7* 12/18/2011   HGBA1C >14.0 12/18/2011   CREATININE 0.69 12/25/2011   CREATININE 0.60 11/30/2011   MICROALBUR 0.56 12/18/2011   MICRALBCREAT 12.4 12/18/2011   CHOL 341* 12/18/2011   HDL 57 12/18/2011   TRIG 275* 12/18/2011    Last eye exam and foot exam: No results found for this basename: HMDIABEYEEXA, HMDIABFOOTEX    Assessment: Diabetes control: not controlled Progress toward goals: deteriorated Barriers to meeting goals: no barriers identified  Plan: Diabetes treatment: continue current medications Increase Lantus to 55 units, because AM blood sugars all greater than 300 Refer to: none Instruction/counseling given: reminded to bring blood glucose meter & log to each visit, reminded to bring medications to each visit and discussed the need for weight loss

## 2012-01-08 NOTE — Assessment & Plan Note (Addendum)
Not well controlled on Zoloft and per patient's psychiatrist she will be switched to Effexor. Effexor is venlafaxine which is SNRI. We'll monitor closely.

## 2012-01-08 NOTE — Progress Notes (Signed)
Subjective:     Patient ID: Christine Dunn, female   DOB: 12-Aug-1991, 21 y.o.   MRN: [id number]  HPI Christine Dunn is a 21 year old woman with past medical history significant for type 1 diabetes, superficial skin lesions, hyperlipidemia and depression who presents today for followup. Patient has been noncompliant with both her depression and diabetes regimen.   Since her last visit patient reports that she is compliant with taking her insulin both her sliding scale regimen and her Lantus. She brings her meter in today which shows her a.m. fasting blood sugars to be greater than 300. She is well-controlled throughout the day when she is taking her sliding scale insulin. After supper her glucose will rise again into the 300s. She denies any hypoglycemic symptoms and denies dizziness or lightheadedness.  In terms of her depression patient sees an outpatient psychiatrist or group which have suggested changing her Zoloft to Effexor. She is actually not received the medication yet and still is taking the Zoloft which she thinks does not help. She is still tearful and frustrated with her current medical situation.  She has new this skin lesions that she tries not to scratch however reports scratching them more at night when she is sleeping. In the past she has tried Ace bandages as well as wearing gloves to prevent her from itching or picking at them. This has failed. Patient's latest refugee is obtaining as she was told that the UV light actually helps minimize the lesion.  Patient has no other complaints or concerns today. She denies chest pain, cough, sob, headache, N/V, changes in abdominal and urinary character.  Review of Systems  All other systems reviewed and are negative.       Objective:   Physical Exam  Constitutional: She appears well-developed.  HENT:  Head: Normocephalic.  Eyes: Pupils are equal, round, and reactive to light.  Neck: Normal range of motion. Neck supple.  Cardiovascular:  Normal rate and regular rhythm.   Pulmonary/Chest: Effort normal and breath sounds normal.  Abdominal: Soft. Bowel sounds are normal.  Musculoskeletal: Normal range of motion.  Skin:       Small skin lesions, hyperpigmented spots from lesions that are healing, some lesions on arms that have been picked at, bleeding slightly, some lesions with scabbing, or crusting

## 2012-01-08 NOTE — Patient Instructions (Signed)
Please follow up with dermatology as scheduled. Please start taking lantus 55 units. Please continue to check your sugars as you normally do. Please follow up in 2 months.  Please start taking Gabapentin at bedtime.

## 2012-01-08 NOTE — Assessment & Plan Note (Signed)
Lab Results  Component Value Date   CHOL 341* 12/18/2011   HDL 57 12/18/2011   LDLCALC 229* 12/18/2011   TRIG 275* 12/18/2011   CHOLHDL 6.0 12/18/2011   LDL not at goal. Per patient she was on statin in past but had to stop due to elevated LFTs. PCP has restarted at low dose and will continue to closely monitor.

## 2012-01-08 NOTE — Assessment & Plan Note (Signed)
Skin lesions all over her arms, legs and trunk of unknown etiology. Based on their appearance they do appear consistent with MRSA boils. Patient also picks and it itches them which makes the lesions worse causing a superficial staph infection. We'll prescribe chlorhexidine SOAP and also discussed bleach baths with if this is indeed MRSA boils. Would feel more comfortable if patient saw dermatology again for a punch biopsy as exact diagnosis unknown. Derm referral made, patient may need to go to [facility]

## 2012-01-23 ENCOUNTER — Telehealth: Payer: Self-pay | Admitting: *Deleted

## 2012-01-23 DIAGNOSIS — G629 Polyneuropathy, unspecified: Secondary | ICD-10-CM

## 2012-01-23 NOTE — Telephone Encounter (Addendum)
Pt called and states she was started on neurontin during last visit on 5/14. She feels the med caused nausea and dizziness.  She tried for a few days and stopped taking it.  She wants to know what else she can take.  [facility] and would like something from there. Usually it takes 6 weeks for [facility] to get meds.  Pt # [id number]  She can continue taking Gabapentin. I reduced the dose from 300 to 100 mg. Try that dose first and to only take at bedtime.

## 2012-01-24 ENCOUNTER — Ambulatory Visit (INDEPENDENT_AMBULATORY_CARE_PROVIDER_SITE_OTHER): Payer: Self-pay | Admitting: Internal Medicine

## 2012-01-24 ENCOUNTER — Encounter: Payer: Self-pay | Admitting: Internal Medicine

## 2012-01-24 VITALS — BP 114/77 | HR 106 | Temp 98.3°F | Ht 64.0 in | Wt 166.4 lb

## 2012-01-24 DIAGNOSIS — E1065 Type 1 diabetes mellitus with hyperglycemia: Secondary | ICD-10-CM

## 2012-01-24 DIAGNOSIS — IMO0002 Reserved for concepts with insufficient information to code with codable children: Secondary | ICD-10-CM

## 2012-01-24 DIAGNOSIS — E1049 Type 1 diabetes mellitus with other diabetic neurological complication: Secondary | ICD-10-CM

## 2012-01-24 DIAGNOSIS — E1142 Type 2 diabetes mellitus with diabetic polyneuropathy: Secondary | ICD-10-CM

## 2012-01-24 DIAGNOSIS — G4700 Insomnia, unspecified: Secondary | ICD-10-CM

## 2012-01-24 DIAGNOSIS — E1149 Type 2 diabetes mellitus with other diabetic neurological complication: Secondary | ICD-10-CM

## 2012-01-24 DIAGNOSIS — G629 Polyneuropathy, unspecified: Secondary | ICD-10-CM

## 2012-01-24 LAB — BASIC METABOLIC PANEL WITH GFR
BUN: 10 mg/dL (ref 6–23)
CO2: 23 mEq/L (ref 19–32)
Calcium: 9.1 mg/dL (ref 8.4–10.5)
Chloride: 99 mEq/L (ref 96–112)
Creat: 0.77 mg/dL (ref 0.50–1.10)
Glucose, Bld: 380 mg/dL — ABNORMAL HIGH (ref 70–99)

## 2012-01-24 LAB — GLUCOSE, CAPILLARY: Glucose-Capillary: 332 mg/dL — ABNORMAL HIGH (ref 70–99)

## 2012-01-24 MED ORDER — GABAPENTIN 100 MG PO CAPS: 100.0000 mg | ORAL_CAPSULE | Freq: Every day | ORAL | Status: DC

## 2012-01-24 MED ORDER — ZOLPIDEM TARTRATE 5 MG PO TABS: 5.0000 mg | ORAL_TABLET | Freq: Every evening | ORAL | Status: DC | PRN

## 2012-01-24 NOTE — Telephone Encounter (Signed)
Pt informed and will try lower dose

## 2012-01-24 NOTE — Patient Instructions (Signed)
1. Please take your medications as prescribed.  2. Follow up with me in one month 3. Follow up with Lupita Leash in one month.  4. Stop Ibuprofen.

## 2012-01-30 DIAGNOSIS — G4700 Insomnia, unspecified: Secondary | ICD-10-CM

## 2012-01-30 HISTORY — DX: Insomnia, unspecified: G47.00

## 2012-01-30 NOTE — Assessment & Plan Note (Signed)
bilateral hand and feet numbness and tingling. She is on low dose Gabapentin now. She also takes a lot of Ibuprofen, Aleve and motrin as well. Apparently, she does not know that all above medications are all NSAIDs, which could cause peripheral edema and kidney impairment.  - patient is instructed to stop all of above NSAIDs medications now - she is instructed to try Gabapentin now and may take Tylenol for any pain.

## 2012-01-30 NOTE — Progress Notes (Signed)
Subjective:     Subjective:   Patient ID: Christine Dunn female   DOB: 1990/09/08 21 y.o.   MRN: [id number]  HPI Christine Dunn is a 21 year old woman with past medical history significant for type 1 diabetes, superficial skin lesions, hyperlipidemia and depression who presents today for followup. Patient has been noncompliant with both her depression and diabetes regimen.   1. DM type I Patient brings her meter to the clinic today, which shows she is not consistent with her CBG checks. She only checks her CBG 1-2 times daily and occasionally 4 times a day even though she claims that she has been checking her CBG 4 times daily.  She is noted to have consistently elevated am CBG of 300s to 500s.  And her sugar will be at 120s to 200s during the day after she gives herself meal coverage insulin.  Patient states that she has been compliant with her Lantus insulin. However, she is noted to be hesitant and avoid eye contact when talking about her lantus insulin.   2. DM neuropathy   Unable to tolerate original dose of Gabapentin due to GI upset, which was decreased to 100 mg po daily at bedtime. Patient states that she takes a lot of  Ibuprofen, Aleve and motrin for this problem as well.  3.  Feet swelling Patient reports that bilateral feet swelling recently. She admits that she drink a lot of fluid daily and takes Ibuprofen, Aleve and motrin for her neuropathy without consulting any medical provider.   No headache, fever, or sore throat. No shortness of breath or dyspnea on exertion. No chest pain, chest pressure or palpitation No nausea, vomiting, or abdominal pain. No melena, diarrhea or incontinence. No muscle weakness. Denies depression. No appetite or weight changes.    Past Medical History  Diagnosis Date  . Diabetes mellitus   . High cholesterol   . Foot deformity, acquired     little toes, toes nail are falling off, needs surgery, but cant happen till AIC < 9  . Fatty liver   . Skin  lesion    Current Outpatient Prescriptions  Medication Sig Dispense Refill  . Blood Glucose Monitoring Suppl (ACCU-CHEK NANO SMARTVIEW) W/DEVICE KIT 1 strip by Other route 6 (six) times daily. Use to test blood glucose up to six times daily. Dx: 250.03  1 kit  0  . Chlorhexidine Gluconate 20 % SOLN Please use cap full 5 ml into bath and soak once weekly.  100 mL  1  . esomeprazole (NEXIUM) 40 MG capsule Take 40 mg by mouth 2 (two) times daily.       Marland Kitchen gabapentin (NEURONTIN) 100 MG capsule Take 1 capsule (100 mg total) by mouth at bedtime.  30 capsule  2  . glucose blood (ACCU-CHEK SMARTVIEW) test strip Use to test blood glucose up to six times daily. Dx 250.03  100 each  12  . insulin aspart (NOVOLOG) 100 UNIT/ML injection Inject 1-3 Units into the skin 3 (three) times daily before meals. Injects 10 units for breakfast  and lunch, 20-25 units for dinner, 10-15 units 1-2 times a day for snacks up to 100 total units per day. ICR is 1:3 for breakfast, 1:2 for lunch and 1: 3 for dinner.      . insulin glargine (LANTUS) 100 UNIT/ML injection Inject 55 Units into the skin at bedtime.  10 mL  3  . lisinopril (ZESTRIL) 2.5 MG tablet Take 1 tablet (2.5 mg total) by mouth  daily.  30 tablet  0  . neomycin-bacitracin-polymyxin (NEOSPORIN) ointment Apply 1 application topically at bedtime. Apply to affected areas on arms      . norgestrel-ethinyl estradiol (LO/OVRAL,CRYSELLE) 0.3-30 MG-MCG tablet Take 1 tablet by mouth daily.      Marland Kitchen OVER THE COUNTER MEDICATION Take 1 tablet by mouth daily. Chromax Plus .      . pravastatin (PRAVACHOL) 20 MG tablet Take 1 tablet (20 mg total) by mouth every evening.  30 tablet  0  . sertraline (ZOLOFT) 100 MG tablet Take 100 mg by mouth daily.      Marland Kitchen ibuprofen (ADVIL,MOTRIN) 200 MG tablet Take 400 mg by mouth every 6 (six) hours as needed. For pain      . zolpidem (AMBIEN) 5 MG tablet Take 1 tablet (5 mg total) by mouth at bedtime as needed for sleep.  30 tablet  0    Family History  Problem Relation Age of Onset  . Diabetes Maternal Grandfather    History   Social History  . Marital Status: Single    Spouse Name: N/A    Number of Children: N/A  . Years of Education: N/A   Social History Main Topics  . Smoking status: Never Smoker   . Smokeless tobacco: None  . Alcohol Use: No  . Drug Use: No  . Sexually Active:    Other Topics Concern  . None   Social History Narrative  . None   Review of Systems: See HPI Objective:  Physical Exam: Filed Vitals:   01/24/12 1528  BP: 114/77  Pulse: 106  Temp: 98.3 F (36.8 C)  TempSrc: Oral  Height: 5\' 4"  (1.626 m)  Weight: 166 lb 6.4 oz (75.479 kg)  SpO2: 99%   General: alert, well-developed, and cooperative to examination.  Head: normocephalic and atraumatic.  Eyes: vision grossly intact, pupils equal, pupils round, pupils reactive to light, no injection and anicteric.  Mouth: pharynx pink and moist, no erythema, and no exudates.  Neck: supple, full ROM, no thyromegaly, no JVD, and no carotid bruits.  Lungs: normal respiratory effort, no accessory muscle use, normal breath sounds, no crackles, and no wheezes. Heart: normal rate, regular rhythm, no murmur, no gallop, and no rub.  Abdomen: soft, non-tender, normal bowel sounds, no distention, no guarding, no rebound tenderness, no hepatomegaly, and no splenomegaly.  Msk: no joint swelling, no joint warmth, and no redness over joints.  Pulses: 2+ DP/PT pulses bilaterally Extremities: No cyanosis or clubbing. B/L feet mild puffiness if any noted. No pitting edema. Neurologic: alert & oriented X3, cranial nerves II-XII intact, strength normal in all extremities, sensation intact to light touch, and gait normal.  Skin: Patient with multiple plaque like scabbed area to bilateral arms, face, bilateral legs and one lesion to back. Her skin lesions are on the various healing stages. Psych: Oriented X3, memory intact for recent and remote, normally  interactive, good eye contact, not anxious appearing, and not depressed appearing.   Assessment & Plan:

## 2012-01-30 NOTE — Assessment & Plan Note (Addendum)
Poorly controlled DM type I I suspect medical noncompliance. However, patient denies it.  It looks that she will give herself Novolog meal coverage at times. I have discussed with Dr. Aundria Rud who suggests to stop Novolog for meal coverage and other oral medications, and only let her to take lantus 50 units daily to ensure medical compliance.  I will discuss with patient about above plan.

## 2012-02-01 ENCOUNTER — Telehealth: Payer: Self-pay | Admitting: Dietician

## 2012-02-01 NOTE — Telephone Encounter (Signed)
Spoke with

## 2012-02-01 NOTE — Telephone Encounter (Signed)
Did not hear back  from patient. Dr. Dierdre Searles has been seeing patient monthly and would like CDE to see patient monthly also. Appointment scheduled on same day as doctor in July and appointment information mailed to patient

## 2012-03-06 ENCOUNTER — Ambulatory Visit: Payer: Self-pay | Admitting: Dietician

## 2012-03-06 ENCOUNTER — Encounter: Payer: Self-pay | Admitting: Internal Medicine

## 2012-03-13 ENCOUNTER — Ambulatory Visit: Payer: Self-pay | Admitting: Dietician

## 2012-03-13 ENCOUNTER — Encounter: Payer: Self-pay | Admitting: Internal Medicine

## 2012-03-26 ENCOUNTER — Telehealth: Payer: Self-pay | Admitting: Dietician

## 2012-03-26 NOTE — Telephone Encounter (Signed)
Called and spoke to mother, Olegario Messier to remind them of appointments tomorrow and requested that patient check her blood sugar mid sleep between 2 and 3 am tonight to help assess her basal insulin coverage overnight.

## 2012-03-27 ENCOUNTER — Telehealth: Payer: Self-pay | Admitting: Dietician

## 2012-03-27 ENCOUNTER — Ambulatory Visit (INDEPENDENT_AMBULATORY_CARE_PROVIDER_SITE_OTHER): Payer: Self-pay | Admitting: Internal Medicine

## 2012-03-27 ENCOUNTER — Encounter: Payer: Self-pay | Admitting: Internal Medicine

## 2012-03-27 ENCOUNTER — Encounter: Payer: Self-pay | Admitting: Dietician

## 2012-03-27 ENCOUNTER — Ambulatory Visit (INDEPENDENT_AMBULATORY_CARE_PROVIDER_SITE_OTHER): Payer: Self-pay | Admitting: Dietician

## 2012-03-27 VITALS — BP 133/94 | HR 114 | Temp 97.4°F | Ht 64.0 in | Wt 164.2 lb

## 2012-03-27 VITALS — Ht 64.0 in | Wt 164.2 lb

## 2012-03-27 DIAGNOSIS — E1065 Type 1 diabetes mellitus with hyperglycemia: Secondary | ICD-10-CM

## 2012-03-27 DIAGNOSIS — F329 Major depressive disorder, single episode, unspecified: Secondary | ICD-10-CM

## 2012-03-27 DIAGNOSIS — Z79899 Other long term (current) drug therapy: Secondary | ICD-10-CM

## 2012-03-27 DIAGNOSIS — E1140 Type 2 diabetes mellitus with diabetic neuropathy, unspecified: Secondary | ICD-10-CM

## 2012-03-27 DIAGNOSIS — L089 Local infection of the skin and subcutaneous tissue, unspecified: Secondary | ICD-10-CM

## 2012-03-27 DIAGNOSIS — E1142 Type 2 diabetes mellitus with diabetic polyneuropathy: Secondary | ICD-10-CM

## 2012-03-27 LAB — GLUCOSE, CAPILLARY

## 2012-03-27 NOTE — Telephone Encounter (Signed)
Per Christine Dunn, patient's mother, they have been running short on insulin and unable to get it [facility]. Called  [facility]- Christine Dunn received   60 ml (2 month supply) in Christine Dunn and they have no record of her calling for samples. They are waiting for more to arrive and are willing to give samples.   They were unable to order her Lantus because of incomplete paperwork. Patient reports she has plenty of lantus currently.

## 2012-03-27 NOTE — Progress Notes (Signed)
Diabetes Self-Management Training (DSMT)  Follow up visit #1  03/27/2012 [name], identified by name and date of birth, is a 21 y.o. female with Type 1 Diabetes. Year of diabetes diagnosis: 1999 Other persons present: mother  ASSESSMENT Patient concerns are mother talked more than patient, limited visit to assessment today.  Height 5\' 4"  (1.626 m), weight 164 lb 3.2 oz (74.481 kg). Body mass index is 28.19 kg/(m^2). Lab Results  Component Value Date   LDLCALC 229* 12/18/2011   Lab Results  Component Value Date   HGBA1C 13.7* 12/18/2011    A1C decreased to 9.1% today Labs reviewed.  Family history of diabetes: Yes Support systems: mother Special needs: depression Prior DM Education: Yes- had insulin pump in past Patients belief/attitude about diabetes: reports her impression was that at [facility] they discouraged her ability to improve her A1C much (rapidly) Self foot exams daily: No Diabetes Complications: depression, hyperlipdemia   Medications See Medications list.  Needs skills/knowledge review   Exercise Plan  need to explore at future visit swithout mother present  .   Self-Monitoring Frequency of testing: 4- 6 times/day- significant amount of hypoglycemia early am and late night and midsleep Monitor: using wave sense presto Hyperglycemia: Yes Daily Hypoglycemia: Yes 3x Weekly   Meal Planning Some knowledge and need review of carb counting skills-reports taking 10 units for 17 grams carb ths am.    Assessment comments: patientwas interactive and indicated a need for autonomy at visit today  INDIVIDUAL DIABETES EDUCATION PLAN:  Medications Meal planning Monitoring Psychosocial adjustment _______________________________________________________________________  Intervention TOPICS COVERED TODAY:  Psychosocial adjustment  plan to assess patient needs in regards to helping her attain her desired amount of autonomy in her diabetes self care  PATIENTS  GOALS/PLAN (copy and paste in patient instructions so patient receives a copy): 1.  Learning Objective:       State knowledge of relationship of insulin and food 2.  Behavioral Objective:         Healthy Coping: For help with diabetes control, I will discuss diabetes with counselor- has been unable to get a counselor  Never 0%            Medications and Monitoring- I will check my basal dose by checking my blood sugar at bedtime, mid sleep and fasting to know if I am taking the correct amount of lantus.   Personalized Follow-Up Plan for Ongoing Self Management Support:  Doctor's Office, family, internet communities, CDE visits and counselor ______________________________________________________________________   Outcomes Expected outcomes: Difficult to tell how receptive to training patient will be Self-care Barriers: Lack of transportation, Lack of material resources, Coping skills Education material provided: no Patient to contact team via Phone if problems or questions. Time in: 1330     Time out: 1500 Future DSMT - 4-6 weeks   Plyler, Lupita Leash   Patient had low blood sugar after visit when she went to lab. Lab administered 4 oz juice to patient. Patient transferred to doctor appointment

## 2012-03-27 NOTE — Progress Notes (Signed)
Patient ID: Christine Dunn, female   DOB: 1991/05/18, 21 y.o.   MRN: [id number] Subjective:     Subjective:   Patient ID: Christine Dunn female   DOB: 1991-03-01 21 y.o.   MRN: [id number]  HPI [name] is a 21 year old woman with past medical history significant for type 1 diabetes, superficial skin lesions, hyperlipidemia and depression who presents today for followup. Patient has been noncompliant with both her depression and diabetes regimen.   1. DM type I Patient brings her meter to the clinic today, which shows some improvements with her CBG checks. She has had some low CBGs readings of 40-50's at 11pm with some shaking feeling and palpitation. She states that she takes 25 units novolog at 8pm for her bedtime snacks and Lantus 65 units at 11pm.  She does not have hypoglycemia episode during the day time.   2. DM neuropathy   Unable to tolerate original dose of Gabapentin due to GI upset, which was decreased to 100 mg po daily at bedtime. She states that she can not tolerate it well with some feeling of nausea.   3. Depression Recently lost her grandfather last Friday and states that she is depressed. But denies SI/HI. On Effexor 675 mg po daily but would like to talk to a psychiatrist.   4. superficial skin infection     Various stages of skin lesions all over her arms, legs and trunk of unknown etiology.  They appear to be consistent with MRSA boils. Patient also picks and it itches them which make her lesions worse. She states that she would like to see a dermatologist.   No headache, fever, or sore throat. No shortness of breath or dyspnea on exertion. No chest pain, chest pressure or palpitation No nausea, vomiting, or abdominal pain. No melena, diarrhea or incontinence. No muscle weakness. Denies depression. No appetite or weight changes.    Past Medical History  Diagnosis Date  . Diabetes mellitus   . High cholesterol   . Foot deformity, acquired     little toes, toes nail  are falling off, needs surgery, but cant happen till AIC < 9  . Fatty liver   . Skin lesion    Current Outpatient Prescriptions  Medication Sig Dispense Refill  . Blood Glucose Monitoring Suppl (ACCU-CHEK NANO SMARTVIEW) W/DEVICE KIT 1 strip by Other route 6 (six) times daily. Use to test blood glucose up to six times daily. Dx: 250.03  1 kit  0  . Chlorhexidine Gluconate 20 % SOLN Please use cap full 5 ml into bath and soak once weekly.  100 mL  1  . esomeprazole (NEXIUM) 40 MG capsule Take 40 mg by mouth 2 (two) times daily.       Marland Kitchen glucose blood (ACCU-CHEK SMARTVIEW) test strip Use to test blood glucose up to six times daily. Dx 250.03  100 each  12  . ibuprofen (ADVIL,MOTRIN) 200 MG tablet Take 400 mg by mouth every 6 (six) hours as needed. For pain      . insulin aspart (NOVOLOG) 100 UNIT/ML injection Inject into the skin 3 (three) times daily before meals. Injects 10 units for breakfast  and lunch, 20-25 units for dinner, 10-15 units 1-2 times a day for snacks up to 100 total units per day. ICR is 1:3 for breakfast, 1:2 for lunch and 1: 3 for dinner.      . insulin glargine (LANTUS) 100 UNIT/ML injection Inject 55 Units into the skin at bedtime.  10 mL  3  . lisinopril (ZESTRIL) 2.5 MG tablet Take 1 tablet (2.5 mg total) by mouth daily.  30 tablet  0  . neomycin-bacitracin-polymyxin (NEOSPORIN) ointment Apply 1 application topically at bedtime. Apply to affected areas on arms      . norgestrel-ethinyl estradiol (LO/OVRAL,CRYSELLE) 0.3-30 MG-MCG tablet Take 1 tablet by mouth daily.      Marland Kitchen OVER THE COUNTER MEDICATION Take 1 tablet by mouth daily. Chromax Plus .      . pravastatin (PRAVACHOL) 20 MG tablet Take 1 tablet (20 mg total) by mouth every evening.  30 tablet  0  . venlafaxine (EFFEXOR) 75 MG tablet Take 75 mg by mouth daily.      Marland Kitchen zolpidem (AMBIEN) 5 MG tablet Take 1 tablet (5 mg total) by mouth at bedtime as needed for sleep.  30 tablet  0   Family History  Problem Relation  Age of Onset  . Diabetes Maternal Grandfather    History   Social History  . Marital Status: Single    Spouse Name: N/A    Number of Children: N/A  . Years of Education: N/A   Social History Main Topics  . Smoking status: Never Smoker   . Smokeless tobacco: None  . Alcohol Use: No  . Drug Use: No  . Sexually Active: None   Other Topics Concern  . None   Social History Narrative  . None   Review of Systems: See HPI Objective:  Physical Exam: Filed Vitals:   03/27/12 1520  BP: 133/94  Pulse: 114  Temp: 97.4 F (36.3 C)  TempSrc: Oral  Height: 5\' 4"  (1.626 m)  Weight: 164 lb 3.2 oz (74.481 kg)  SpO2: 98%   General: alert, well-developed, and cooperative to examination.  Head: normocephalic and atraumatic.  Eyes: vision grossly intact, pupils equal, pupils round, pupils reactive to light, no injection and anicteric.  Mouth: pharynx pink and moist, no erythema, and no exudates.  Neck: supple, full ROM, no thyromegaly, no JVD, and no carotid bruits.  Lungs: normal respiratory effort, no accessory muscle use, normal breath sounds, no crackles, and no wheezes. Heart: normal rate, regular rhythm, no murmur, no gallop, and no rub.  Abdomen: soft, non-tender, normal bowel sounds, no distention, no guarding, no rebound tenderness, no hepatomegaly, and no splenomegaly.  Msk: no joint swelling, no joint warmth, and no redness over joints.  Pulses: 2+ DP/PT pulses bilaterally Extremities: No cyanosis or clubbing.No pitting edema. Neurologic: alert & oriented X3, cranial nerves II-XII intact, strength normal in all extremities, sensation intact to light touch, and gait normal.  Skin: Patient with multiple plaque like scabbed area to bilateral arms, face, bilateral legs and one lesion to back. Her skin lesions are on the various healing stages. Psych: Oriented X3, memory intact for recent and remote, normally interactive, good eye contact, not anxious appearing, and not depressed  appearing.   Assessment & Plan:

## 2012-03-27 NOTE — Patient Instructions (Addendum)
You are doing a great job self managing your diabetes!!!  The book provided today discusses how to check your basal insulin- give me a call if you need help with that- [phone]  The book  also goes over the carb counting stuff we went over in case you want to read over that as a reminder.   Please make an appointment with Christine Dunn when you make an appointment with your doctor.   1. Will send referral to dermatology and psychiatrist  2. Please stop your bedtime snack and bedtime Novolog 3. Please continue your current Lantus and check your blood sugars. Call the clinic next week to report your blood sugar at night time.

## 2012-03-28 ENCOUNTER — Encounter: Payer: Self-pay | Admitting: Internal Medicine

## 2012-03-28 DIAGNOSIS — M21969 Unspecified acquired deformity of unspecified lower leg: Secondary | ICD-10-CM | POA: Insufficient documentation

## 2012-03-28 DIAGNOSIS — K760 Fatty (change of) liver, not elsewhere classified: Secondary | ICD-10-CM | POA: Insufficient documentation

## 2012-03-28 NOTE — Assessment & Plan Note (Signed)
See HPI.  - will send psychiatrist referral.

## 2012-03-28 NOTE — Assessment & Plan Note (Signed)
Unable to tolerate Neurontin at a very low dose. Patient also states that she does not want to take anything else for now due to her financial stress. She is in process of getting herself insurance, and she would like to discuss with me about the further treatment once she gets insurance.

## 2012-03-28 NOTE — Assessment & Plan Note (Signed)
HGB 9.1 at the clinic today. CBG log reviewed. Much better DM control now with some hypoglycemic episodes noted at night times.  - discuss with patient about her DM control - will ask her to cut back her bedtime snacks and bedtime Novolog - patient is instructed to continue to check her CBGS including night time CBGS and call back to the clinic next week to report her CBGS.  - patient agrees with above plan.

## 2012-03-28 NOTE — Assessment & Plan Note (Signed)
Patient states that her skin infection seems coming back and would like to see her dermatologist.  - dermatologist referral was sent.

## 2012-04-01 ENCOUNTER — Telehealth: Payer: Self-pay | Admitting: Licensed Clinical Social Worker

## 2012-04-01 NOTE — Telephone Encounter (Signed)
[name] was referred to CSW for assistance with psychiatry.  Christine Dunn is known to this worker as CSW met with pt and her mother on 12/25/11.  At that time, CSW provided Christine Dunn with the contact information to [place] and [employer] of the [place].  CSW placed called to pt today; CSW left message requesting return call. CSW provided contact hours and phone number.

## 2012-04-01 NOTE — Telephone Encounter (Signed)
Called patient to be sure she was able to get Novolog insulin from [facility]. Per her mother Olegario Messier, [facility] was not able to get insulin from [facility], Marie was supposed to call us about this. Mother feels that Cassanda being with her grandfather when he passed has worsened her depression. she has  ~150 units (1/2 pen) left.   Will request doctor sign out samples of Novolog for patient to pick up.

## 2012-04-03 NOTE — Telephone Encounter (Signed)
CSW returned call to Ms. Eckard.  Pt states she has had several deaths in the family and is on her way to a funeral today.  Ms. Onofrio informed CSW that her mother attempted to schedule her an appt but was agency told her mother that would call her back and had not heard from agency.  CSW reminded Ms. Dredge of the two agencies [place] and [employer] of the [place] that offer walk-in hours.  CSW encouraged pt with current family deaths and school starting this may be a good time to obtain counseling services.  Pt acknowledge and stated she would call CSW if she needed the walk-in hours.

## 2012-04-23 ENCOUNTER — Telehealth: Payer: Self-pay | Admitting: Licensed Clinical Social Worker

## 2012-04-23 ENCOUNTER — Other Ambulatory Visit: Payer: Self-pay | Admitting: *Deleted

## 2012-04-23 ENCOUNTER — Telehealth: Payer: Self-pay | Admitting: *Deleted

## 2012-04-23 DIAGNOSIS — G4700 Insomnia, unspecified: Secondary | ICD-10-CM

## 2012-04-23 MED ORDER — INSULIN ASPART 100 UNIT/ML ~~LOC~~ SOLN: SUBCUTANEOUS | Status: DC

## 2012-04-23 MED ORDER — ZOLPIDEM TARTRATE 5 MG PO TABS: 5.0000 mg | ORAL_TABLET | Freq: Every evening | ORAL | Status: DC | PRN

## 2012-04-23 NOTE — Telephone Encounter (Signed)
Pt's mother called in to CSW to obtain names of agencies that provide mental health services to uninsured.  CSW provided names and contact information to [place] and [employer] of the [place].  Lengthy conversation with pt's mother regarding mother's concern for pt's current medical, mental health and financial situation.  Mother states pt has been in increased pain d/t increased walking at school and [facility] did not cover certain medications.  CSW informed mother, if [facility] does not fill medication: county pharmacy or [facility] [facility] for pain meds, may be good alternatives.  Mother states she has been trying to apply for Medicaid for pt but has yet to receive a call back from [facility].  Pt/mother has yet to meet with [employer] as possible application for financial assistance with current bills.  CSW provided emotional support and encouragement to mother.  Mother voiced other medical concerns, CSW encouraged pt/mother to follow up with RN regarding medical concerns.  Mother was transferred to main [facility] line.

## 2012-04-23 NOTE — Telephone Encounter (Signed)
I have called patient's mother. Patient is in the school now.  I asked patient's mother and patient to come to the clinic for appt tomorrow afternoon.

## 2012-04-23 NOTE — Telephone Encounter (Signed)
Pt's mother calls and states pt continues to be depressed and "moody", she is informed of [facility] walkin clinic and instructed that this is the way to establish care with [facility], she is given the times and location. Mother states that the gabapentin made pt feel sleepy, dizzy and so she has stopped this med, wants to know if there is anything else that would relieve leg pain and restlessness at night, she states maybe a low dose controlled medication so [facility], please advise on this also she is asking for a refill of ambien for pt so she can get that at [facility] on [address]

## 2012-04-23 NOTE — Telephone Encounter (Signed)
Zolpidem [facility] Insulin called to [facility]

## 2012-04-23 NOTE — Telephone Encounter (Addendum)
[facility] calls for lab result, i do not see where pt was referred and [facility] cannot verify that [facility] referred, ask for release of information, have obtained via fax, faxed A1C results to them and verbal via phone

## 2012-04-24 ENCOUNTER — Encounter: Payer: Self-pay | Admitting: Internal Medicine

## 2012-04-24 ENCOUNTER — Ambulatory Visit (INDEPENDENT_AMBULATORY_CARE_PROVIDER_SITE_OTHER): Payer: Self-pay | Admitting: Internal Medicine

## 2012-04-24 VITALS — BP 124/88 | HR 110 | Temp 97.4°F | Ht 64.0 in | Wt 163.0 lb

## 2012-04-24 DIAGNOSIS — E785 Hyperlipidemia, unspecified: Secondary | ICD-10-CM

## 2012-04-24 DIAGNOSIS — E1142 Type 2 diabetes mellitus with diabetic polyneuropathy: Secondary | ICD-10-CM

## 2012-04-24 DIAGNOSIS — E1140 Type 2 diabetes mellitus with diabetic neuropathy, unspecified: Secondary | ICD-10-CM

## 2012-04-24 DIAGNOSIS — F329 Major depressive disorder, single episode, unspecified: Secondary | ICD-10-CM

## 2012-04-24 DIAGNOSIS — E1065 Type 1 diabetes mellitus with hyperglycemia: Secondary | ICD-10-CM

## 2012-04-24 DIAGNOSIS — E1149 Type 2 diabetes mellitus with other diabetic neurological complication: Secondary | ICD-10-CM

## 2012-04-24 DIAGNOSIS — L089 Local infection of the skin and subcutaneous tissue, unspecified: Secondary | ICD-10-CM

## 2012-04-24 LAB — LIPID PANEL
Cholesterol: 366 mg/dL — ABNORMAL HIGH (ref 0–200)
Total CHOL/HDL Ratio: 7.3 Ratio

## 2012-04-24 LAB — CBC
HCT: 44 % (ref 36.0–46.0)
Hemoglobin: 14.6 g/dL (ref 12.0–15.0)
MCH: 28.5 pg (ref 26.0–34.0)
RBC: 5.13 MIL/uL — ABNORMAL HIGH (ref 3.87–5.11)

## 2012-04-24 MED ORDER — CAPSAICIN-MENTHOL-METHYL SAL 0.025-1-12 % EX CREA: 1.0000 "application " | TOPICAL_CREAM | Freq: Four times a day (QID) | CUTANEOUS | Status: DC

## 2012-04-24 MED ORDER — CHLORHEXIDINE GLUCONATE 20 % SOLN: Status: DC

## 2012-04-24 MED ORDER — VENLAFAXINE HCL ER 75 MG PO CP24: 150.0000 mg | ORAL_CAPSULE | Freq: Every day | ORAL | Status: AC

## 2012-04-24 NOTE — Patient Instructions (Addendum)
1. follow up with me monthly 2. Follow up with Christine Dunn monthly 3. Pap smear in Dec.  4.  Please use Effexor 150 mg ER daily Capsaicin crease 4 times daily Chlorhexidine 5-10 ml with bath weekly  5. Walk in clinic with [facility] for behavioral therapy and psycho therapy.

## 2012-04-24 NOTE — Addendum Note (Signed)
Addended by: Neomia Dear on: 04/24/2012 05:31 PM   Modules accepted: Orders

## 2012-04-24 NOTE — Assessment & Plan Note (Signed)
Her recurrent skin infections are noted on all over her arms and legs and anterior trunk which are the area that she can reach and pick on.  Patient admits that she constantly picks on her lesions which will make it worse.  - Chlorhexidine bath weekly ordered - Encourage patient to go to walk-in clinic at [facility] for behavior therapy by her psychiatrist.

## 2012-04-24 NOTE — Assessment & Plan Note (Signed)
Patient reports that her bilateral lower extremities pain, numbness and tingling bother her a lot, and she is unable to tolerate Gabapentin, which is Discontinued.  -The management of diabetic neuropathy is discussed with the patient -Optimal glycemic control is important - Will increase her Effexor XR to 150 mg daily which is higher dose that will work on neuropathy as well. -Will prescribe capsaicin cream to bilateral LE 4 times a day as needed for pain

## 2012-04-24 NOTE — Assessment & Plan Note (Signed)
Suboptimal control of her depression on Effexor XR 75 mg po daily.  Denies SI/HI. I have discussed in depth with patient about her life stressors.  -Will increase her Effexor XR to 150 mg by mouth daily, which will control her depression better and also has effects on her diabetic neuropathy with higher dose of Effexor. -Encourage patient to go to walk-in clinic at [facility] to make appointment to see a psychiatrist for behavior therapy for better control her anxiety and Psychotherapy for depression.

## 2012-04-24 NOTE — Progress Notes (Signed)
Subjective:   Patient ID: Christine Dunn female   DOB: Dec 28, 1990 21 y.o.   MRN: [id number]  HPI: [name] is a 21 y.o.  woman with past medical history significant for type 1 diabetes, superficial skin lesions, hyperlipidemia,  depression and medical noncompliance  who presents today for follow up on her DM neuropathy and depression.  #. DM neuropathy   Unable to tolerate original dose of Gabapentin due to GI upset, which was decreased to 100 mg po daily at bedtime. She states that she can not tolerate it well with some feeling of nausea.   #. Depression Recently lost several family members including her grandfather and grand uncle.  Patient takes Effexor XR 75 mg po daily and states that she still feels depressed 2-3 days a week . But denies SI/HI.  she planned to go to walk in clinic at [facility] to see a psychiatrist, however, she has not setup any appointment yet due to recent family deaths.    # superficial skin infection     Various stages of skin lesions all over her arms, legs and trunk of unknown etiology.  Patient also picks and it itches them which make her lesions worse.  Patient admits that she picks on her skin lesions whenever she's anxious   No headache, fever, or sore throat. No shortness of breath or dyspnea on exertion. No chest pain, chest pressure or palpitation No nausea, vomiting, or abdominal pain. No melena, diarrhea or incontinence. No muscle weakness. Denies depression. No appetite or weight changes.    Health maintenance 1. Check BMP, Lipid Panel, CBC 2. TDap,   vaccinated recently from her previous doctor's office 3. Pneumococcal vaccine,  vaccinated recently from her previous doctor's office 4. Pap smear , would like to have it done in Dec. 5. Ophthalmology, will send referral.  6. Foot exam, will preform it during this OV.   Past Medical History  Diagnosis Date  . Foot deformity, acquired     little toes, toes nail are falling off, needs surgery,  but cant happen till AIC < 9  . Fatty liver   . Diabetic neuropathy 01/08/2012  . Depression 12/25/2011  . Diabetes type 1, uncontrolled 08/27/1997  . Hyperlipidemia LDL goal < 100 12/19/2011  . Superficial skin infection 12/18/2011    Recurrent for past 2 years   . Insomnia 01/30/2012   Current Outpatient Prescriptions  Medication Sig Dispense Refill  . Blood Glucose Monitoring Suppl (ACCU-CHEK NANO SMARTVIEW) W/DEVICE KIT 1 strip by Other route 6 (six) times daily. Use to test blood glucose up to six times daily. Dx: 250.03  1 kit  0  . Capsaicin-Menthol-Methyl Sal (CAPSAICIN-METHYL SAL-MENTHOL) 0.025-1-12 % CREA Apply 1 application topically 4 (four) times daily.  2 Tube  1  . Chlorhexidine Gluconate 20 % SOLN Please use cap full 5 ml into bath and soak once weekly.  120 mL  1  . esomeprazole (NEXIUM) 40 MG capsule Take 40 mg by mouth 2 (two) times daily.       Marland Kitchen glucose blood (ACCU-CHEK SMARTVIEW) test strip Use to test blood glucose up to six times daily. Dx 250.03  100 each  12  . ibuprofen (ADVIL,MOTRIN) 200 MG tablet Take 400 mg by mouth every 6 (six) hours as needed. For pain      . insulin aspart (NOVOLOG) 100 UNIT/ML injection Injects 10 units for breakfast  and lunch, 20-25 units for dinner, 10-15 units 1-2 times a day for snacks up to  100 total units per day. ICR is 1:3 for breakfast, 1:2 for lunch and 1: 3 for dinner.  3 pen  1  . insulin glargine (LANTUS) 100 UNIT/ML injection Inject 65 Units into the skin at bedtime.      Marland Kitchen lisinopril (ZESTRIL) 2.5 MG tablet Take 1 tablet (2.5 mg total) by mouth daily.  30 tablet  0  . neomycin-bacitracin-polymyxin (NEOSPORIN) ointment Apply 1 application topically at bedtime. Apply to affected areas on arms      . norgestrel-ethinyl estradiol (LO/OVRAL,CRYSELLE) 0.3-30 MG-MCG tablet Take 1 tablet by mouth daily.      Marland Kitchen OVER THE COUNTER MEDICATION Take 1 tablet by mouth daily. Chromax Plus .      . pravastatin (PRAVACHOL) 20 MG tablet Take 1  tablet (20 mg total) by mouth every evening.  30 tablet  0  . venlafaxine XR (EFFEXOR XR) 75 MG 24 hr capsule Take 2 capsules (150 mg total) by mouth daily.  180 capsule  4  . zolpidem (AMBIEN) 5 MG tablet Take 1 tablet (5 mg total) by mouth at bedtime as needed for sleep.  30 tablet  0   Family History  Problem Relation Age of Onset  . Diabetes Maternal Grandfather    History   Social History  . Marital Status: Single    Spouse Name: N/A    Number of Children: N/A  . Years of Education: N/A   Social History Main Topics  . Smoking status: Never Smoker   . Smokeless tobacco: None  . Alcohol Use: No  . Drug Use: No  . Sexually Active: None   Other Topics Concern  . None   Social History Narrative  . None   Review of Systems: See HPI Objective:  Physical Exam: Filed Vitals:   04/24/12 1511  BP: 124/88  Pulse: 110  Temp: 97.4 F (36.3 C)  TempSrc: Oral  Height: 5\' 4"  (1.626 m)  Weight: 163 lb (73.936 kg)  SpO2: 100%   General: alert, well-developed, and cooperative to examination.  Head: normocephalic and atraumatic.  Eyes: vision grossly intact, pupils equal, pupils round, pupils reactive to light, no injection and anicteric.  Mouth: pharynx pink and moist, no erythema, and no exudates.  Neck: supple, full ROM, no thyromegaly, no JVD, and no carotid bruits.  Lungs: normal respiratory effort, no accessory muscle use, normal breath sounds, no crackles, and no wheezes. Heart: normal rate, regular rhythm, no murmur, no gallop, and no rub.  Abdomen: soft, non-tender, normal bowel sounds, no distention, no guarding, no rebound tenderness, no hepatomegaly, and no splenomegaly.  Msk: no joint swelling, no joint warmth, and no redness over joints.  Pulses: 2+ DP/PT pulses bilaterally Extremities: No cyanosis or clubbing.No pitting edema. Neurologic: alert & oriented X3, cranial nerves II-XII intact, strength normal in all extremities, sensation intact to light touch, and  gait normal.  Skin: Patient with multiple plaque like scabbed area to bilateral arms, face, bilateral legs and one lesion to back. Her skin lesions are on the various healing stages. Psych: Oriented X3, memory intact for recent and remote, normally interactive, good eye contact, not anxious appearing, and not depressed appearing.  Assessment & Plan:

## 2012-04-24 NOTE — Addendum Note (Signed)
Addended by: Neomia Dear on: 04/24/2012 05:33 PM   Modules accepted: Orders

## 2012-04-25 ENCOUNTER — Other Ambulatory Visit: Payer: Self-pay | Admitting: Internal Medicine

## 2012-04-25 DIAGNOSIS — E1065 Type 1 diabetes mellitus with hyperglycemia: Secondary | ICD-10-CM

## 2012-04-25 LAB — BASIC METABOLIC PANEL WITH GFR
BUN: 10 mg/dL (ref 6–23)
CO2: 23 mEq/L (ref 19–32)
Chloride: 99 mEq/L (ref 96–112)
GFR, Est African American: >89 mL/min
Glucose, Bld: 235 mg/dL — ABNORMAL HIGH (ref 70–99)
Potassium: 4.9 mEq/L (ref 3.5–5.3)
Sodium: 137 mEq/L (ref 135–145)

## 2012-04-25 LAB — LDL CHOLESTEROL, DIRECT: Direct LDL: 288 mg/dL — ABNORMAL HIGH

## 2012-04-25 NOTE — Addendum Note (Signed)
Addended byDierdre Searles, Alylah Blakney on: 04/25/2012 12:45 PM   Modules accepted: Orders

## 2012-04-29 NOTE — Progress Notes (Signed)
INTERNAL MEDICINE TEACHING ATTENDING ADDENDUM Christine Dunn , MD: I personally saw and evaluated Christine Dunn in this clinic visit in conjunction with the resident, Dr. Dierdre Searles. I have discussed the patient's plan of care with Dr. Dierdre Searles during this visit. I have confirmed the physical exam findings and have read and agree with the clinic note including the plan.

## 2012-05-07 ENCOUNTER — Telehealth: Payer: Self-pay | Admitting: Internal Medicine

## 2012-05-07 DIAGNOSIS — E785 Hyperlipidemia, unspecified: Secondary | ICD-10-CM

## 2012-05-07 MED ORDER — PRAVASTATIN SODIUM 20 MG PO TABS: 40.0000 mg | ORAL_TABLET | Freq: Every evening | ORAL | Status: DC

## 2012-05-07 NOTE — Telephone Encounter (Signed)
direct LDL 288, will increase her Pravachol to 40 mg po daily at bedtime. I will ask the clinic nurse to call patient.

## 2012-05-08 ENCOUNTER — Ambulatory Visit (INDEPENDENT_AMBULATORY_CARE_PROVIDER_SITE_OTHER): Payer: Self-pay | Admitting: Internal Medicine

## 2012-05-08 ENCOUNTER — Encounter: Payer: Self-pay | Admitting: Internal Medicine

## 2012-05-08 VITALS — BP 124/81 | HR 110 | Temp 98.6°F | Ht 64.0 in | Wt 155.6 lb

## 2012-05-08 DIAGNOSIS — B880 Other acariasis: Secondary | ICD-10-CM

## 2012-05-08 DIAGNOSIS — Z23 Encounter for immunization: Secondary | ICD-10-CM

## 2012-05-08 DIAGNOSIS — B8809 Other acariasis: Secondary | ICD-10-CM

## 2012-05-08 DIAGNOSIS — IMO0002 Reserved for concepts with insufficient information to code with codable children: Secondary | ICD-10-CM

## 2012-05-08 DIAGNOSIS — E1065 Type 1 diabetes mellitus with hyperglycemia: Secondary | ICD-10-CM

## 2012-05-08 MED ORDER — TRIAMCINOLONE ACETONIDE 0.1 % EX OINT: TOPICAL_OINTMENT | Freq: Two times a day (BID) | CUTANEOUS | Status: DC

## 2012-05-08 NOTE — Progress Notes (Signed)
Subjective:   Patient ID: Christine Dunn female   DOB: 19-Jan-1991 21 y.o.   MRN: [id number]  HPI: [name] is a 21 y.o. woman who presents to clinic today complaining of itching and lesions on her legs that started after she was working in a wooded area the Saturday prior to her visit.  She states that she worse long pants and closed shoes.  She states that the itching and lesions started on the left thigh and then spread from there.  Currently covering most of her leg on the left and right.  She states that these lesions are very itchy.  She has been using calamine lotion, benadryl, and oatmeal baths which help some.    She has a history of type I diabetes and has been taking her medications as prescribed and denies polyuria, polydipsia, abdominal pain, nausea, vomiting, fatigue, or somnolence.    She would also like to get her flu shot today.   Past Medical History  Diagnosis Date  . Foot deformity, acquired     little toes, toes nail are falling off, needs surgery, but cant happen till AIC < 9  . Fatty liver   . Diabetic neuropathy 01/08/2012  . Depression 12/25/2011  . Diabetes type 1, uncontrolled 08/27/1997  . Hyperlipidemia LDL goal < 100 12/19/2011  . Superficial skin infection 12/18/2011    Recurrent for past 2 years   . Insomnia 01/30/2012   Current Outpatient Prescriptions  Medication Sig Dispense Refill  . Blood Glucose Monitoring Suppl (ACCU-CHEK NANO SMARTVIEW) W/DEVICE KIT 1 strip by Other route 6 (six) times daily. Use to test blood glucose up to six times daily. Dx: 250.03  1 kit  0  . Capsaicin-Menthol-Methyl Sal (CAPSAICIN-METHYL SAL-MENTHOL) 0.025-1-12 % CREA Apply 1 application topically 4 (four) times daily.  2 Tube  1  . Chlorhexidine Gluconate 20 % SOLN Please use cap full 5 ml into bath and soak once weekly.  120 mL  1  . esomeprazole (NEXIUM) 40 MG capsule Take 40 mg by mouth 2 (two) times daily.       Marland Kitchen glucose blood (ACCU-CHEK SMARTVIEW) test strip Use to  test blood glucose up to six times daily. Dx 250.03  100 each  12  . ibuprofen (ADVIL,MOTRIN) 200 MG tablet Take 400 mg by mouth every 6 (six) hours as needed. For pain      . insulin aspart (NOVOLOG) 100 UNIT/ML injection Injects 10 units for breakfast  and lunch, 20-25 units for dinner, 10-15 units 1-2 times a day for snacks up to 100 total units per day. ICR is 1:3 for breakfast, 1:2 for lunch and 1: 3 for dinner.  3 pen  1  . insulin glargine (LANTUS) 100 UNIT/ML injection Inject 65 Units into the skin at bedtime.      Marland Kitchen lisinopril (ZESTRIL) 2.5 MG tablet Take 1 tablet (2.5 mg total) by mouth daily.  30 tablet  0  . neomycin-bacitracin-polymyxin (NEOSPORIN) ointment Apply 1 application topically at bedtime. Apply to affected areas on arms      . norgestrel-ethinyl estradiol (LO/OVRAL,CRYSELLE) 0.3-30 MG-MCG tablet Take 1 tablet by mouth daily.      Marland Kitchen OVER THE COUNTER MEDICATION Take 1 tablet by mouth daily. Chromax Plus .      . pravastatin (PRAVACHOL) 20 MG tablet Take 2 tablets (40 mg total) by mouth every evening.  30 tablet  11  . venlafaxine XR (EFFEXOR XR) 75 MG 24 hr capsule Take 2 capsules (150 mg  total) by mouth daily.  180 capsule  4  . zolpidem (AMBIEN) 5 MG tablet Take 1 tablet (5 mg total) by mouth at bedtime as needed for sleep.  30 tablet  0   Family History  Problem Relation Age of Onset  . Diabetes Maternal Grandfather    History   Social History  . Marital Status: Single    Spouse Name: N/A    Number of Children: N/A  . Years of Education: N/A   Social History Main Topics  . Smoking status: Never Smoker   . Smokeless tobacco: Not on file  . Alcohol Use: No  . Drug Use: No  . Sexually Active: Not on file   Other Topics Concern  . Not on file   Social History Narrative  . No narrative on file   Review of Systems: Constitutional: Denies fever, chills, diaphoresis, appetite change and fatigue.  HEENT: Denies photophobia, eye pain, redness, hearing loss,  ear pain, congestion, sore throat, rhinorrhea, sneezing, mouth sores, trouble swallowing, neck pain, neck stiffness and tinnitus.   Respiratory: Denies SOB, DOE, cough, chest tightness,  and wheezing.   Cardiovascular: Denies chest pain, palpitations and leg swelling.  Gastrointestinal: Denies nausea, vomiting, abdominal pain, diarrhea, constipation, blood in stool and abdominal distention.  Genitourinary: Denies dysuria, urgency, frequency, hematuria, flank pain and difficulty urinating.  Musculoskeletal: Denies myalgias, back pain, joint swelling, arthralgias and gait problem.  Skin: Positive for rash. Denies pallor, and wound.  Neurological: Denies dizziness, seizures, syncope, weakness, light-headedness, numbness and headaches.  Hematological: Denies adenopathy. Easy bruising, personal or family bleeding history  Psychiatric/Behavioral: Denies suicidal ideation, mood changes, confusion, nervousness, sleep disturbance and agitation  Objective:  Physical Exam: Filed Vitals:   05/08/12 1458  BP: 124/81  Pulse: 110  Temp: 98.6 F (37 C)  TempSrc: Oral  Height: 5\' 4"  (1.626 m)  Weight: 155 lb 9.6 oz (70.58 kg)  SpO2: 99%   Constitutional: Vital signs reviewed.  Patient is a well-developed and well-nourished woman in no acute distress and cooperative with exam. Alert and oriented x3.  Head: Normocephalic and atraumatic Ear: TM normal bilaterally Mouth: no erythema or exudates, MMM Eyes: PERRL, EOMI, conjunctivae normal, No scleral icterus.  Neck: Supple, Trachea midline normal ROM, No JVD, mass, thyromegaly, or carotid bruit present.  Cardiovascular: RRR, S1 normal, S2 normal, no MRG, pulses symmetric and intact bilaterally Pulmonary/Chest: CTAB, no wheezes, rales, or rhonchi Abdominal: Soft. Non-tender, non-distended, bowel sounds are normal, no masses, organomegaly, or guarding present.  GU: no CVA tenderness Musculoskeletal: No joint deformities, erythema, or stiffness, ROM full  and no nontender Hematology: no cervical, inginal, or axillary adenopathy.  Neurological: A&O x3, Strength is normal and symmetric bilaterally, cranial nerve II-XII are grossly intact, no focal motor deficit, sensory intact to light touch bilaterally.  Skin: scattered areas of papular lesions with surrounding erythema and excoriation marks.  Warm, dry and intact. No cyanosis, or clubbing.  Psychiatric: Normal mood and affect. speech and behavior is normal. Judgment and thought content normal. Cognition and memory are normal.   Assessment & Plan:

## 2012-05-08 NOTE — Patient Instructions (Signed)
1.  Use the steroid ointment to help relieve the itching  2.  You can also use the Calamine lotion as well  3.  Benadryl at bedtime to help relieve the itching.   4.  Follow up with your primary doctor in November.

## 2012-05-13 ENCOUNTER — Telehealth: Payer: Self-pay | Admitting: *Deleted

## 2012-05-13 DIAGNOSIS — L089 Local infection of the skin and subcutaneous tissue, unspecified: Secondary | ICD-10-CM

## 2012-05-13 NOTE — Telephone Encounter (Signed)
Pharm wants to know if on the august script you want hibclens or betacept? They are both 4% for topical use, could you send electronically? Thank you

## 2012-05-14 MED ORDER — CHLORHEXIDINE GLUCONATE 20 % SOLN: Status: DC

## 2012-06-05 ENCOUNTER — Encounter: Payer: Self-pay | Admitting: Internal Medicine

## 2012-06-05 ENCOUNTER — Telehealth: Payer: Self-pay | Admitting: Internal Medicine

## 2012-06-05 MED ORDER — CHLORHEXIDINE GLUCONATE 4 % EX LIQD: 60.0000 mL | Freq: Every day | CUTANEOUS | Status: DC | PRN

## 2012-06-05 NOTE — Telephone Encounter (Signed)
I discussed with [facility] pharmacist who states that they only carry Chlorhexidine 4 %. prescription is changed. Patient is called and notified to contact my nurse Mamie to pick up her Rx.

## 2012-07-18 ENCOUNTER — Other Ambulatory Visit: Payer: Self-pay | Admitting: *Deleted

## 2012-07-18 DIAGNOSIS — G4700 Insomnia, unspecified: Secondary | ICD-10-CM

## 2012-07-19 MED ORDER — ZOLPIDEM TARTRATE 5 MG PO TABS: 5.0000 mg | ORAL_TABLET | Freq: Every evening | ORAL | Status: DC | PRN

## 2012-07-21 NOTE — Telephone Encounter (Signed)
Called to pharm 

## 2012-08-25 ENCOUNTER — Ambulatory Visit (INDEPENDENT_AMBULATORY_CARE_PROVIDER_SITE_OTHER): Payer: No Typology Code available for payment source | Admitting: Internal Medicine

## 2012-08-25 ENCOUNTER — Encounter: Payer: Self-pay | Admitting: Internal Medicine

## 2012-08-25 VITALS — BP 130/90 | HR 116 | Temp 98.6°F | Ht 64.0 in | Wt 150.0 lb

## 2012-08-25 DIAGNOSIS — E1065 Type 1 diabetes mellitus with hyperglycemia: Secondary | ICD-10-CM

## 2012-08-25 NOTE — Patient Instructions (Addendum)
1. Follow up in one month 2. Follow up with Lupita Leash next week 3. Increase your Lantus to 38 units and then 40 units daily if you do not have hypoglycemic events and no Blood sugar < 70. Then increase your Lantus by 2 units daily every week. Do not increase your lantus if you have CBG < 70 or hypoglycemic events.   Hypoglycemia (Low Blood Sugar) Hypoglycemia is when the glucose (sugar) in your blood is too low. Hypoglycemia can happen for many reasons. It can happen to people with or without diabetes. Hypoglycemia can develop quickly and can be a medical emergency.  CAUSES  Having hypoglycemia does not mean that you will develop diabetes. Different causes include:  Missed or delayed meals or not enough carbohydrates eaten.  Medication overdose. This could be by accident or deliberate. If by accident, your medication may need to be adjusted or changed.  Exercise or increased activity without adjustments in carbohydrates or medications.  A nerve disorder that affects body functions like your heart rate, blood pressure and digestion (autonomic neuropathy).  A condition where the stomach muscles do not function properly (gastroparesis). Therefore, medications may not absorb properly.  The inability to recognize the signs of hypoglycemia (hypoglycemic unawareness).  Absorption of insulin  may be altered.  Alcohol consumption.  Pregnancy/menstrual cycles/postpartum. This may be due to hormones.  Certain kinds of tumors. This is very rare. SYMPTOMS   Sweating.  Hunger.  Dizziness.  Blurred vision.  Drowsiness.  Weakness.  Headache.  Rapid heart beat.  Shakiness.  Nervousness. DIAGNOSIS  Diagnosis is made by monitoring blood glucose in one or all of the following ways:  Fingerstick blood glucose monitoring.  Laboratory results. TREATMENT  If you think your blood glucose is low:  Check your blood glucose, if possible. If it is less than 70 mg/dl, take one of the  following:  3-4 glucose tablets.   cup juice (prefer clear like apple).   cup "regular" soda pop.  1 cup milk.  -1 tube of glucose gel.  5-6 hard candies.  Do not over treat because your blood glucose (sugar) will only go too high.  Wait 15 minutes and recheck your blood glucose. If it is still less than 70 mg/dl (or below your target range), repeat treatment.  Eat a snack if it is more than one hour until your next meal. Sometimes, your blood glucose may go so low that you are unable to treat yourself. You may need someone to help you. You may even pass out or be unable to swallow. This may require you to get an injection of glucagon, which raises the blood glucose. HOME CARE INSTRUCTIONS  Check blood glucose as recommended by your caregiver.  Take medication as prescribed by your caregiver.  Follow your meal plan. Do not skip meals. Eat on time.  If you are going to drink alcohol, drink it only with meals.  Check your blood glucose before driving.  Check your blood glucose before and after exercise. If you exercise longer or different than usual, be sure to check blood glucose more frequently.  Always carry treatment with you. Glucose tablets are the easiest to carry.  Always wear medical alert jewelry or carry some form of identification that states that you have diabetes. This will alert people that you have diabetes. If you have hypoglycemia, they will have a better idea on what to do. SEEK MEDICAL CARE IF:   You are having problems keeping your blood sugar at target range.  You are having frequent episodes of hypoglycemia.  You feel you might be having side effects from your medicines.  You have symptoms of an illness that is not improving after 3-4 days.  You notice a change in vision or a new problem with your vision. SEEK IMMEDIATE MEDICAL CARE IF:   You are a family member or friend of a person whose blood glucose goes below 70 mg/dl and is accompanied  by:  Confusion.  A change in mental status.  The inability to swallow.  Passing out. Document Released: 08/13/2005 Document Revised: 11/05/2011 Document Reviewed: 12/10/2011 [employer] Patient Information 2013 [place], [place].    General Instructions:    Treatment Goals:  Goals (1 Years of Data) as of 08/25/2012          As of Today 05/08/12 04/24/12 03/27/12 01/24/12     Blood Pressure    . Blood Pressure < 140/90  130/90 124/81 124/88 133/94 114/77     Result Component    . HEMOGLOBIN A1C < 7.0  9.0   9.1     . LDL CALC < 100            Progress Toward Treatment Goals:  Treatment Goal 08/25/2012  Hemoglobin A1C improved    Self Care Goals & Plans:  Self Care Goal 08/25/2012  Manage my medications take my medicines as prescribed; refill my medications on time; follow the sick day instructions if I am sick; bring my medications to every visit  Monitor my health keep track of my blood glucose; keep track of my blood pressure; keep track of my weight; check my feet daily; bring my blood pressure log to each visit; bring my glucose meter and log to each visit  Eat healthy foods drink diet soda or water instead of juice or soda; eat foods that are low in salt; eat fruit for snacks and desserts; eat smaller portions; eat baked foods instead of fried foods; eat more vegetables  Be physically active find an activity I enjoy    Home Blood Glucose Monitoring 08/25/2012  Check my blood sugar 3 times a day  When to check my blood sugar before breakfast; before lunch; before dinner     Care Management & Community Referrals:  Referral 08/25/2012  Referrals made for care management support diabetes educator

## 2012-08-25 NOTE — Progress Notes (Signed)
Subjective:   Patient ID: Christine Dunn female   DOB: Jun 22, 1991 21 y.o.   MRN: [id number]  HPI: Christine Dunn is a 21 y.o.  woman with past medical history significant for type 1 diabetes, superficial skin lesions, hyperlipidemia,  depression and medical noncompliance  who presents today for follow up on her DM.  # DM, type 1  Per document, patient is on Lantus 65 units daily.  However, she states that she starts to take 35 unit daily one month ago after " I thought that I was taking 35.". Her Hb A1C is 9 today, which is improved from previous read  No headache, fever, or sore throat. No shortness of breath or dyspnea on exertion. No chest pain, chest pressure or palpitation No nausea, vomiting, or abdominal pain. No melena, diarrhea or incontinence. No muscle weakness. Denies depression. No appetite or weight changes.    Health maintenance  -. TDap,   vaccinated recently from her previous doctor's office -. Pneumococcal vaccine,  vaccinated recently from her previous doctor's office -. Pap smear - at GYN office next week.    Past Medical History  Diagnosis Date  . Foot deformity, acquired     little toes, toes nail are falling off, needs surgery, but cant happen till AIC < 9  . Fatty liver   . Diabetic neuropathy 01/08/2012  . Depression 12/25/2011  . Diabetes type 1, uncontrolled 08/27/1997  . Hyperlipidemia LDL goal < 100 12/19/2011  . Superficial skin infection 12/18/2011    Recurrent for past 2 years   . Insomnia 01/30/2012   Current Outpatient Prescriptions  Medication Sig Dispense Refill  . Blood Glucose Monitoring Suppl (ACCU-CHEK NANO SMARTVIEW) W/DEVICE KIT 1 strip by Other route 6 (six) times daily. Use to test blood glucose up to six times daily. Dx: 250.03  1 kit  0  . Capsaicin-Menthol-Methyl Sal (CAPSAICIN-METHYL SAL-MENTHOL) 0.025-1-12 % CREA Apply 1 application topically 4 (four) times daily.  2 Tube  1  . chlorhexidine (HIBICLENS) 4 % external liquid Apply 60  mLs (4 application total) topically daily as needed. Put 10 ml into the bath and soak bath weekly  120 mL  0  . esomeprazole (NEXIUM) 40 MG capsule Take 40 mg by mouth 2 (two) times daily.       Marland Kitchen glucose blood (ACCU-CHEK SMARTVIEW) test strip Use to test blood glucose up to six times daily. Dx 250.03  100 each  12  . ibuprofen (ADVIL,MOTRIN) 200 MG tablet Take 400 mg by mouth every 6 (six) hours as needed. For pain      . insulin aspart (NOVOLOG) 100 UNIT/ML injection Injects 10 units for breakfast  and lunch, 20-25 units for dinner, 10-15 units 1-2 times a day for snacks up to 100 total units per day. ICR is 1:3 for breakfast, 1:2 for lunch and 1: 3 for dinner.  3 pen  1  . insulin glargine (LANTUS) 100 UNIT/ML injection Inject 35 Units into the skin at bedtime.       Marland Kitchen lisinopril (ZESTRIL) 2.5 MG tablet Take 1 tablet (2.5 mg total) by mouth daily.  30 tablet  0  . neomycin-bacitracin-polymyxin (NEOSPORIN) ointment Apply 1 application topically at bedtime. Apply to affected areas on arms      . norgestrel-ethinyl estradiol (LO/OVRAL,CRYSELLE) 0.3-30 MG-MCG tablet Take 1 tablet by mouth daily.      Marland Kitchen OVER THE COUNTER MEDICATION Take 1 tablet by mouth daily. Chromax Plus .      . pravastatin (  PRAVACHOL) 20 MG tablet Take 2 tablets (40 mg total) by mouth every evening.  30 tablet  11  . triamcinolone ointment (KENALOG) 0.1 % Apply topically 2 (two) times daily.  30 g  0  . venlafaxine XR (EFFEXOR XR) 75 MG 24 hr capsule Take 2 capsules (150 mg total) by mouth daily.  180 capsule  4  . zolpidem (AMBIEN) 5 MG tablet Take 1 tablet (5 mg total) by mouth at bedtime as needed for sleep.  30 tablet  3   Family History  Problem Relation Age of Onset  . Diabetes Maternal Grandfather    History   Social History  . Marital Status: Single    Spouse Name: N/A    Number of Children: N/A  . Years of Education: N/A   Social History Main Topics  . Smoking status: Never Smoker   . Smokeless tobacco:  None  . Alcohol Use: No  . Drug Use: No  . Sexually Active: None   Other Topics Concern  . None   Social History Narrative  . None   Review of Systems: See HPI Objective:  Physical Exam: Filed Vitals:   08/25/12 1541  BP: 130/90  Pulse: 116  Temp: 98.6 F (37 C)  TempSrc: Oral  Height: 5\' 4"  (1.626 m)  Weight: 150 lb (68.04 kg)  SpO2: 100%   General: alert, well-developed, and cooperative to examination.  Head: normocephalic and atraumatic.  Eyes: vision grossly intact, pupils equal, pupils round, pupils reactive to light, no injection and anicteric.  Mouth: pharynx pink and moist, no erythema, and no exudates.  Neck: supple, full ROM, no thyromegaly, no JVD, and no carotid bruits.  Lungs: normal respiratory effort, no accessory muscle use, normal breath sounds, no crackles, and no wheezes. Heart: normal rate, regular rhythm, no murmur, no gallop, and no rub.  Abdomen: soft, non-tender, normal bowel sounds, no distention, no guarding, no rebound tenderness, no hepatomegaly, and no splenomegaly.  Msk: no joint swelling, no joint warmth, and no redness over joints.  Pulses: 2+ DP/PT pulses bilaterally Extremities: No cyanosis or clubbing.No pitting edema. Neurologic: alert & oriented X3, cranial nerves II-XII intact, strength normal in all extremities, sensation intact to light touch, and gait normal.  Psych: Oriented X3, memory intact for recent and remote, normally interactive, good eye contact, not anxious appearing, and not depressed appearing.  Assessment & Plan:

## 2012-08-25 NOTE — Assessment & Plan Note (Addendum)
Lab Results  Component Value Date   HGBA1C 9.0 08/25/2012   HGBA1C 9.1 03/27/2012   HGBA1C 13.7* 12/18/2011     Assessment:  Diabetes control: fair control  Progress toward A1C goal:  improved   Plan:  Medications:  continue current medications  Home glucose monitoring:   Frequency: 3 times a day   Timing: before breakfast;before lunch;before dinner  Instruction/counseling given: reminded to get eye exam, reminded to bring blood glucose meter & log to each visit and reminded to bring medications to each visit  Educational resources provided:    Self management tools provided:      - Increase your Lantus to 38 units and then 40 units daily if you do not have hypoglycemic events and no Blood sugar < 70. Then increase your Lantus by 2 units daily every week. Do not increase your lantus if you have CBG < 70 or hypoglycemic events.

## 2012-08-28 ENCOUNTER — Ambulatory Visit: Payer: Self-pay | Admitting: Internal Medicine

## 2012-10-09 ENCOUNTER — Encounter: Payer: No Typology Code available for payment source | Admitting: Internal Medicine

## 2012-10-30 ENCOUNTER — Other Ambulatory Visit: Payer: Self-pay | Admitting: *Deleted

## 2012-10-31 MED ORDER — INSULIN ASPART 100 UNIT/ML ~~LOC~~ SOLN: SUBCUTANEOUS | Status: DC

## 2012-11-03 NOTE — Telephone Encounter (Signed)
Called to pharm 

## 2012-11-04 NOTE — Assessment & Plan Note (Signed)
05/11/12:  CBG in the office today is good.  We will continue her medications as prescribed.

## 2012-11-04 NOTE — Assessment & Plan Note (Signed)
Her lesions appear consistent with Chigger bites.  We will continue conservative treatment as well as adding in a topical corticosteroid for symptomatic relief.  We will also have her continue to wash gently with soap and water to prevent infection.

## 2012-11-20 ENCOUNTER — Ambulatory Visit (INDEPENDENT_AMBULATORY_CARE_PROVIDER_SITE_OTHER): Payer: No Typology Code available for payment source | Admitting: Internal Medicine

## 2012-11-20 VITALS — BP 132/89 | HR 114 | Temp 97.4°F | Ht 64.0 in | Wt 151.2 lb

## 2012-11-20 DIAGNOSIS — E663 Overweight: Secondary | ICD-10-CM | POA: Insufficient documentation

## 2012-11-20 DIAGNOSIS — E785 Hyperlipidemia, unspecified: Secondary | ICD-10-CM

## 2012-11-20 DIAGNOSIS — E1065 Type 1 diabetes mellitus with hyperglycemia: Secondary | ICD-10-CM

## 2012-11-20 DIAGNOSIS — Z79899 Other long term (current) drug therapy: Secondary | ICD-10-CM

## 2012-11-20 LAB — LIPID PANEL
Cholesterol: 248 mg/dL — ABNORMAL HIGH (ref 0–200)
HDL: 52 mg/dL (ref >39)
Total CHOL/HDL Ratio: 4.8 Ratio
Triglycerides: 325 mg/dL — ABNORMAL HIGH (ref <150)
VLDL: 65 mg/dL — ABNORMAL HIGH (ref 0–40)

## 2012-11-20 LAB — POCT GLYCOSYLATED HEMOGLOBIN (HGB A1C): Hemoglobin A1C: 9

## 2012-11-20 NOTE — Patient Instructions (Addendum)
General Instructions: 1. Follow up in 3 months 2. Titrate your Lantus as follows. Checkyour blood sugars 4 times daily  Take Lantus 47 units tonight, Then increase to 50 units on Monday 11/24/12 if you do not have hypoglycemic events or CBG no < 100. Then increase your Lantus to 52 units on Thursday 4/3, please call the clinic [phone] for all of your blood sugars.    Treatment Goals:  Goals (1 Years of Data) as of 11/20/12         As of Today 08/25/12 05/08/12 04/24/12 03/27/12     Blood Pressure    . Blood Pressure < 140/90  132/89 130/90 124/81 124/88 133/94     Result Component    . HEMOGLOBIN A1C < 7.0  9.0 9.0   9.1    . LDL CALC < 100            Progress Toward Treatment Goals:  Treatment Goal 11/20/2012  Hemoglobin A1C unchanged    Self Care Goals & Plans:  Self Care Goal 11/20/2012  Manage my medications take my medicines as prescribed; refill my medications on time; follow the sick day instructions if I am sick; bring my medications to every visit  Monitor my health keep track of my blood glucose  Eat healthy foods drink diet soda or water instead of juice or soda; eat more vegetables  Be physically active find an activity I enjoy    Home Blood Glucose Monitoring 11/20/2012  Check my blood sugar 4 times a day  When to check my blood sugar before breakfast; before lunch; before dinner; at bedtime     Care Management & Community Referrals:  Referral 11/20/2012  Referrals made for care management support nutritionist; diabetes educator  Referrals made to community resources weight management

## 2012-11-20 NOTE — Progress Notes (Signed)
Subjective:   Patient ID: Christine Dunn female   DOB: 07-11-1991 22 y.o.   MRN: [id number]  HPI: [name] is a 22 y.o.  woman with past medical history significant for type 1 diabetes, superficial skin lesions, hyperlipidemia,  depression and medical noncompliance  who presents today for follow up on her DM.  # DM, type 1 Patient states that she feels ok. Denies polydipsia, polyuria, weakness, fatigue or hypoglycemic events. States that she has been self titrating her Lantus. She takes Lantus 45 units along with meal time coverage now.  # Hyperlipidemia On Pravastatin 40 daily.  # superficial skin infection, better - Follows up with a Dermatologist now.  - will obtain Office records.   # overweight - she has intentional weight loss.  # screening and vaccination - will obtain medical records from her GYN  - discussed about Gardasil vaccination-- patient want to think about it.    No headache, fever, or sore throat. No shortness of breath or dyspnea on exertion. No chest pain, chest pressure or palpitation No nausea, vomiting, or abdominal pain. No melena, diarrhea or incontinence. No muscle weakness. Denies depression. No appetite or weight changes Health maintenance  Health maintenance  - TDap,   vaccinated recently from her previous doctor's office - Pneumococcal vaccine,  vaccinated recently from her previous doctor's office - Pap smear - done- will obtain medical records.  - Opthalmologist exam -- done -- Will obtain medical records    Past Medical History  Diagnosis Date  . Foot deformity, acquired     little toes, toes nail are falling off, needs surgery, but cant happen till AIC < 9  . Fatty liver   . Diabetic neuropathy 01/08/2012  . Depression 12/25/2011  . Diabetes type 1, uncontrolled 08/27/1997  . Hyperlipidemia LDL goal < 100 12/19/2011  . Superficial skin infection 12/18/2011    Recurrent for past 2 years   . Insomnia 01/30/2012   Current Outpatient  Prescriptions  Medication Sig Dispense Refill  . Blood Glucose Monitoring Suppl (ACCU-CHEK NANO SMARTVIEW) W/DEVICE KIT 1 strip by Other route 6 (six) times daily. Use to test blood glucose up to six times daily. Dx: 250.03  1 kit  0  . Capsaicin-Menthol-Methyl Sal (CAPSAICIN-METHYL SAL-MENTHOL) 0.025-1-12 % CREA Apply 1 application topically 4 (four) times daily.  2 Tube  1  . chlorhexidine (HIBICLENS) 4 % external liquid Apply 60 mLs (4 application total) topically daily as needed. Put 10 ml into the bath and soak bath weekly  120 mL  0  . esomeprazole (NEXIUM) 40 MG capsule Take 40 mg by mouth 2 (two) times daily.       Marland Kitchen glucose blood (ACCU-CHEK SMARTVIEW) test strip Use to test blood glucose up to six times daily. Dx 250.03  100 each  12  . ibuprofen (ADVIL,MOTRIN) 200 MG tablet Take 400 mg by mouth every 6 (six) hours as needed. For pain      . insulin aspart (NOVOLOG) 100 UNIT/ML injection Injects 10 units for breakfast  and lunch, 20-25 units for dinner, 10-15 units 1-2 times a day for snacks up to 100 total units per day. ICR is 1:3 for breakfast, 1:2 for lunch and 1: 3 for dinner.  10 pen  5  . insulin glargine (LANTUS) 100 UNIT/ML injection Inject 35 Units into the skin at bedtime.       Marland Kitchen lisinopril (ZESTRIL) 2.5 MG tablet Take 1 tablet (2.5 mg total) by mouth daily.  30 tablet  0  . neomycin-bacitracin-polymyxin (NEOSPORIN) ointment Apply 1 application topically at bedtime. Apply to affected areas on arms      . norgestrel-ethinyl estradiol (LO/OVRAL,CRYSELLE) 0.3-30 MG-MCG tablet Take 1 tablet by mouth daily.      Marland Kitchen OVER THE COUNTER MEDICATION Take 1 tablet by mouth daily. Chromax Plus .      . pravastatin (PRAVACHOL) 20 MG tablet Take 2 tablets (40 mg total) by mouth every evening.  30 tablet  11  . triamcinolone ointment (KENALOG) 0.1 % Apply topically 2 (two) times daily.  30 g  0  . venlafaxine XR (EFFEXOR XR) 75 MG 24 hr capsule Take 2 capsules (150 mg total) by mouth daily.   180 capsule  4  . zolpidem (AMBIEN) 5 MG tablet Take 1 tablet (5 mg total) by mouth at bedtime as needed for sleep.  30 tablet  3   No current facility-administered medications for this visit.   Family History  Problem Relation Age of Onset  . Diabetes Maternal Grandfather    History   Social History  . Marital Status: Single    Spouse Name: N/A    Number of Children: N/A  . Years of Education: N/A   Social History Main Topics  . Smoking status: Never Smoker   . Smokeless tobacco: Not on file  . Alcohol Use: No  . Drug Use: No  . Sexually Active: Not on file   Other Topics Concern  . Not on file   Social History Narrative  . No narrative on file   Review of Systems: See HPI Objective:  Physical Exam: Filed Vitals:   11/20/12 1425  BP: 132/89  Pulse: 114  Temp: 97.4 F (36.3 C)  TempSrc: Oral  Height: 5\' 4"  (1.626 m)  Weight: 151 lb 3.2 oz (68.584 kg)  SpO2: 100%   General: alert, well-developed, and cooperative to examination.  Head: normocephalic and atraumatic.  Eyes: vision grossly intact, pupils equal, pupils round, pupils reactive to light, no injection and anicteric.  Mouth: pharynx pink and moist, no erythema, and no exudates.  Neck: supple, full ROM, no thyromegaly, no JVD, and no carotid bruits.  Lungs: normal respiratory effort, no accessory muscle use, normal breath sounds, no crackles, and no wheezes. Heart: normal rate, regular rhythm, no murmur, no gallop, and no rub.  Abdomen: soft, non-tender, normal bowel sounds, no distention, no guarding, no rebound tenderness, no hepatomegaly, and no splenomegaly.  Msk: no joint swelling, no joint warmth, and no redness over joints.  Pulses: 2+ DP/PT pulses bilaterally Extremities: No cyanosis or clubbing.No pitting edema. Neurologic: alert & oriented X3, cranial nerves II-XII intact, strength normal in all extremities, sensation intact to light touch, and gait normal.  Psych: Oriented X3, memory intact for  recent and remote, normally interactive, good eye contact, not anxious appearing, and not depressed appearing.  Assessment & Plan:

## 2012-11-23 NOTE — Assessment & Plan Note (Signed)
Patient continues to have intentional weight loss with lifestyle changes.  discussed about diet and exercise plan.

## 2012-11-23 NOTE — Assessment & Plan Note (Addendum)
Lab Results  Component Value Date   HGBA1C 9.0 11/20/2012   HGBA1C 9.0 08/25/2012   HGBA1C 9.1 03/27/2012     Assessment:  Diabetes control: poor control (HgbA1C >9%)  Progress toward A1C goal:  unchanged  Comments:    Plan:   Home glucose monitoring:   Frequency: 4 times a day   Timing: before breakfast;before lunch;before dinner;at bedtime  Instruction/counseling given: reminded to bring blood glucose meter & log to each visit, reminded to bring medications to each visit, discussed foot care, discussed the need for weight loss, discussed diet, discussed sick day management and provided printed educational material  Educational resources provided: brochure  Self management tools provided: copy of home glucose meter download;home glucose logbook  Other plans:    Take Lantus 47 units tonight, Then increase to 50 units on Monday 11/24/12 if you do not have hypoglycemic events or CBG no < 100. Then increase your Lantus to 52 units on Thursday 4/3, please call the clinic [phone] for all of your blood sugars

## 2012-11-23 NOTE — Assessment & Plan Note (Signed)
Lipid Panel     Component Value Date/Time   CHOL 248* 11/20/2012 1501   TRIG 325* 11/20/2012 1501   HDL 52 11/20/2012 1501   CHOLHDL 4.8 11/20/2012 1501   VLDL 65* 11/20/2012 1501   LDLCALC 131* 11/20/2012 1501   -much better LDL today - will continue current therapy

## 2012-12-10 ENCOUNTER — Other Ambulatory Visit: Payer: Self-pay | Admitting: Internal Medicine

## 2012-12-16 NOTE — Telephone Encounter (Signed)
Called to pharm 

## 2013-02-03 ENCOUNTER — Encounter: Payer: Self-pay | Admitting: Dietician

## 2013-02-12 ENCOUNTER — Telehealth: Payer: Self-pay | Admitting: Dietician

## 2013-02-12 NOTE — Telephone Encounter (Signed)
Patient agrees to have retinal photos on 02-13-13. Appointment scheduled for 2 PM. Please enter order

## 2013-02-13 ENCOUNTER — Other Ambulatory Visit (INDEPENDENT_AMBULATORY_CARE_PROVIDER_SITE_OTHER): Payer: Self-pay

## 2013-02-13 ENCOUNTER — Other Ambulatory Visit: Payer: Self-pay | Admitting: Internal Medicine

## 2013-02-13 DIAGNOSIS — E1065 Type 1 diabetes mellitus with hyperglycemia: Secondary | ICD-10-CM

## 2013-02-13 DIAGNOSIS — E1139 Type 2 diabetes mellitus with other diabetic ophthalmic complication: Secondary | ICD-10-CM

## 2013-02-13 LAB — HM DIABETES EYE EXAM

## 2013-02-13 NOTE — Telephone Encounter (Signed)
Ordered entered. Thanks

## 2013-02-20 ENCOUNTER — Encounter: Payer: Self-pay | Admitting: Internal Medicine

## 2013-02-20 DIAGNOSIS — E11319 Type 2 diabetes mellitus with unspecified diabetic retinopathy without macular edema: Secondary | ICD-10-CM | POA: Insufficient documentation

## 2013-02-20 DIAGNOSIS — E113299 Type 2 diabetes mellitus with mild nonproliferative diabetic retinopathy without macular edema, unspecified eye: Secondary | ICD-10-CM | POA: Insufficient documentation

## 2013-02-23 NOTE — Addendum Note (Signed)
Addended by: Remus Blake on: 02/23/2013 10:35 AM   Modules accepted: Orders

## 2013-02-24 DIAGNOSIS — G8929 Other chronic pain: Secondary | ICD-10-CM

## 2013-02-24 HISTORY — DX: Other chronic pain: G89.29

## 2013-03-05 ENCOUNTER — Other Ambulatory Visit: Payer: Self-pay

## 2013-03-28 ENCOUNTER — Encounter (HOSPITAL_COMMUNITY): Payer: Self-pay | Admitting: *Deleted

## 2013-03-28 ENCOUNTER — Emergency Department (HOSPITAL_COMMUNITY): Payer: Self-pay

## 2013-03-28 ENCOUNTER — Emergency Department (HOSPITAL_COMMUNITY)
Admission: EM | Admit: 2013-03-28 | Discharge: 2013-03-28 | Disposition: A | Discharge status: Home / Self Care | Payer: Self-pay | Attending: Emergency Medicine | Admitting: Emergency Medicine

## 2013-03-28 DIAGNOSIS — Z794 Long term (current) use of insulin: Secondary | ICD-10-CM | POA: Insufficient documentation

## 2013-03-28 DIAGNOSIS — E1142 Type 2 diabetes mellitus with diabetic polyneuropathy: Secondary | ICD-10-CM | POA: Insufficient documentation

## 2013-03-28 DIAGNOSIS — F3289 Other specified depressive episodes: Secondary | ICD-10-CM | POA: Insufficient documentation

## 2013-03-28 DIAGNOSIS — G4700 Insomnia, unspecified: Secondary | ICD-10-CM | POA: Insufficient documentation

## 2013-03-28 DIAGNOSIS — Z3202 Encounter for pregnancy test, result negative: Secondary | ICD-10-CM | POA: Insufficient documentation

## 2013-03-28 DIAGNOSIS — R739 Hyperglycemia, unspecified: Secondary | ICD-10-CM

## 2013-03-28 DIAGNOSIS — F329 Major depressive disorder, single episode, unspecified: Secondary | ICD-10-CM | POA: Insufficient documentation

## 2013-03-28 DIAGNOSIS — Z862 Personal history of diseases of the blood and blood-forming organs and certain disorders involving the immune mechanism: Secondary | ICD-10-CM | POA: Insufficient documentation

## 2013-03-28 DIAGNOSIS — R000 Tachycardia, unspecified: Secondary | ICD-10-CM | POA: Insufficient documentation

## 2013-03-28 DIAGNOSIS — Z8719 Personal history of other diseases of the digestive system: Secondary | ICD-10-CM | POA: Insufficient documentation

## 2013-03-28 DIAGNOSIS — R0602 Shortness of breath: Secondary | ICD-10-CM | POA: Insufficient documentation

## 2013-03-28 DIAGNOSIS — Z880 Allergy status to penicillin: Secondary | ICD-10-CM | POA: Insufficient documentation

## 2013-03-28 DIAGNOSIS — Z872 Personal history of diseases of the skin and subcutaneous tissue: Secondary | ICD-10-CM | POA: Insufficient documentation

## 2013-03-28 DIAGNOSIS — E872 Acidosis, unspecified: Secondary | ICD-10-CM | POA: Insufficient documentation

## 2013-03-28 DIAGNOSIS — Z8739 Personal history of other diseases of the musculoskeletal system and connective tissue: Secondary | ICD-10-CM | POA: Insufficient documentation

## 2013-03-28 DIAGNOSIS — R0789 Other chest pain: Secondary | ICD-10-CM | POA: Insufficient documentation

## 2013-03-28 DIAGNOSIS — Z8639 Personal history of other endocrine, nutritional and metabolic disease: Secondary | ICD-10-CM | POA: Insufficient documentation

## 2013-03-28 DIAGNOSIS — R079 Chest pain, unspecified: Secondary | ICD-10-CM

## 2013-03-28 DIAGNOSIS — E1069 Type 1 diabetes mellitus with other specified complication: Secondary | ICD-10-CM | POA: Insufficient documentation

## 2013-03-28 DIAGNOSIS — IMO0002 Reserved for concepts with insufficient information to code with codable children: Secondary | ICD-10-CM | POA: Insufficient documentation

## 2013-03-28 DIAGNOSIS — Z79899 Other long term (current) drug therapy: Secondary | ICD-10-CM | POA: Insufficient documentation

## 2013-03-28 LAB — D-DIMER, QUANTITATIVE (NOT AT ARMC): D-Dimer, Quant: 0.34 ug/mL-FEU (ref 0.00–0.48)

## 2013-03-28 LAB — BASIC METABOLIC PANEL
BUN: 8 mg/dL (ref 6–23)
BUN: 7 mg/dL (ref 6–23)
CO2: 12 mEq/L — ABNORMAL LOW (ref 19–32)
CO2: 20 mEq/L (ref 19–32)
Calcium: 9.5 mg/dL (ref 8.4–10.5)
Calcium: 8.5 mg/dL (ref 8.4–10.5)
Chloride: 96 mEq/L (ref 96–112)
Chloride: 103 mEq/L (ref 96–112)
Creatinine, Ser: 0.62 mg/dL (ref 0.50–1.10)
Creatinine, Ser: 0.49 mg/dL — ABNORMAL LOW (ref 0.50–1.10)
GFR calc Af Amer: >90 mL/min (ref >90)
GFR calc Af Amer: >90 mL/min (ref >90)
GFR calc non Af Amer: >90 mL/min (ref >90)
GFR calc non Af Amer: >90 mL/min (ref >90)
Glucose, Bld: 229 mg/dL — ABNORMAL HIGH (ref 70–99)
Glucose, Bld: 42 mg/dL — CL (ref 70–99)
Potassium: 4 mEq/L (ref 3.5–5.1)
Potassium: 3.7 mEq/L (ref 3.5–5.1)
Sodium: 134 mEq/L — ABNORMAL LOW (ref 135–145)
Sodium: 138 mEq/L (ref 135–145)

## 2013-03-28 LAB — CBC
HCT: 40.7 % (ref 36.0–46.0)
MCH: 30.9 pg (ref 26.0–34.0)
MCV: 89.8 fL (ref 78.0–100.0)
RBC: 4.53 MIL/uL (ref 3.87–5.11)
RDW: 13.5 % (ref 11.5–15.5)
WBC: 6.9 10*3/uL (ref 4.0–10.5)

## 2013-03-28 LAB — GLUCOSE, CAPILLARY
Glucose-Capillary: 175 mg/dL — ABNORMAL HIGH (ref 70–99)
Glucose-Capillary: 32 mg/dL — CL (ref 70–99)
Glucose-Capillary: 325 mg/dL — ABNORMAL HIGH (ref 70–99)

## 2013-03-28 LAB — POCT I-STAT TROPONIN I: Troponin i, poc: 0 ng/mL (ref 0.00–0.08)

## 2013-03-28 LAB — POCT I-STAT 3, VENOUS BLOOD GAS (G3P V)
pCO2, Ven: 33.2 mmHg — ABNORMAL LOW (ref 45.0–50.0)
pH, Ven: 7.405 — ABNORMAL HIGH (ref 7.250–7.300)

## 2013-03-28 LAB — KETONES, QUALITATIVE: Acetone, Bld: NEGATIVE

## 2013-03-28 LAB — CG4 I-STAT (LACTIC ACID): Lactic Acid, Venous: 2.63 mmol/L — ABNORMAL HIGH (ref 0.5–2.2)

## 2013-03-28 MED ORDER — SODIUM CHLORIDE 0.9 % IV BOLUS (SEPSIS)
2000.0000 mL | Freq: Once | INTRAVENOUS | Status: AC
  Administered 2013-03-28: 2000 mL via INTRAVENOUS

## 2013-03-28 MED ORDER — ASPIRIN 325 MG PO TABS: 325.0000 mg | ORAL_TABLET | ORAL | Status: DC

## 2013-03-28 MED ORDER — ASPIRIN 81 MG PO CHEW
324.0000 mg | CHEWABLE_TABLET | Freq: Once | ORAL | Status: AC
  Administered 2013-03-28: 324 mg via ORAL
  Filled 2013-03-28: qty 4

## 2013-03-28 NOTE — ED Notes (Signed)
Pt reports 4 days ago CP started on LT side. To day Pain radiates into LT ARM. Pt A/O .

## 2013-03-28 NOTE — ED Notes (Signed)
Pt brought back to room from triage via wheelchair with family in tow; pt undressed, in gown, on monitor, continuous pulse oximetry and blood pressure cuff; family at bedside

## 2013-03-28 NOTE — ED Provider Notes (Addendum)
Medical screening examination/treatment/procedure(s) were conducted as a shared visit with non-physician practitioner(s) and myself.  I personally evaluated the patient during the encounter Pt with hyperglycemia, initially with CP, but none now.  Similar CP before with hyperglycemia.  Nothing else suggestive of ACS.  +acidosis, but pt very well appearing with no active vomiting.  Will give fluids, reassess.  Rolan Bucco, MD 03/28/13 1610  Rolan Bucco, MD 04/08/13 [id number]

## 2013-03-28 NOTE — ED Notes (Signed)
Checked patient blood sugar it was 114 notified RN Timmie Foerster

## 2013-03-28 NOTE — ED Notes (Signed)
1407 - Pt given orange juice and peanut butter. Pt A/O  x4 .

## 2013-03-28 NOTE — ED Provider Notes (Signed)
CSN: [id number]     Arrival date & time 03/28/13  1884 History     First MD Initiated Contact with Patient 03/28/13 [phone]     Chief Complaint  Patient presents with  . Chest Pain   (Consider location/radiation/quality/duration/timing/severity/associated sxs/prior Treatment) HPI Patient presents to the emergency department with complaints of chest discomfort and shortness of breath.  Patient, states she feels, like this when her blood sugars are elevated.  Patient, states, that nothing seems to make her condition, better or worse.  Patient denies nausea, vomiting, diarrhea, headache, blurred vision, weakness, numbness, dizziness, fever, cough, rash or syncope.  Patient, states, that her blood sugar was elevated this morning and she gave herself an extra dose of insulin.  Patient, states her last several, months.  She's not been feeling well, and her blood sugars have been up and down the entire time patient, states, that nothing seems to make her condition, better or worse. Past Medical History  Diagnosis Date  . Foot deformity, acquired     little toes, toes nail are falling off, needs surgery, but cant happen till AIC < 9  . Fatty liver   . Diabetic neuropathy 01/08/2012  . Depression 12/25/2011  . Diabetes type 1, uncontrolled 08/27/1997  . Hyperlipidemia LDL goal < 100 12/19/2011  . Superficial skin infection 12/18/2011    Recurrent for past 2 years   . Insomnia 01/30/2012   Past Surgical History  Procedure Laterality Date  . Wrist surgery      rerouted tendons in wrist   Family History  Problem Relation Age of Onset  . Diabetes Maternal Grandfather    History  Substance Use Topics  . Smoking status: Never Smoker   . Smokeless tobacco: Not on file  . Alcohol Use: No   OB History   Grav Para Term Preterm Abortions TAB SAB Ect Mult Living                 Review of Systems All other systems negative except as documented in the HPI. All pertinent positives and negatives as  reviewed in the HPI.  Allergies  Gabapentin (phn) and Penicillins  Home Medications   Current Outpatient Rx  Name  Route  Sig  Dispense  Refill  . esomeprazole (NEXIUM) 40 MG capsule   Oral   Take 40 mg by mouth 2 (two) times daily as needed (Acid reflux).          . insulin aspart (NOVOLOG) 100 UNIT/ML injection      Injects 10 units for breakfast  and lunch, 20-25 units for dinner, 10-15 units 1-2 times a day for snacks up to 100 total units per day. ICR is 1:3 for breakfast, 1:2 for lunch and 1: 3 for dinner.   10 pen   5   . insulin glargine (LANTUS) 100 UNIT/ML injection   Subcutaneous   Inject 60 Units into the skin at bedtime.          Marland Kitchen neomycin-bacitracin-polymyxin (NEOSPORIN) ointment   Topical   Apply 1 application topically at bedtime. Apply to affected areas on arms         . norgestrel-ethinyl estradiol (LO/OVRAL,CRYSELLE) 0.3-30 MG-MCG tablet   Oral   Take 1 tablet by mouth daily.         . pregabalin (LYRICA) 50 MG capsule   Oral   Take 50 mg by mouth 2 (two) times daily.         Marland Kitchen venlafaxine XR (EFFEXOR XR) 75 MG  24 hr capsule   Oral   Take 2 capsules (150 mg total) by mouth daily.   180 capsule   4   . zolpidem (AMBIEN) 5 MG tablet   Oral   Take 5 mg by mouth at bedtime as needed for sleep.         . Blood Glucose Monitoring Suppl (ACCU-CHEK NANO SMARTVIEW) W/DEVICE KIT   Other   1 strip by Other route 6 (six) times daily. Use to test blood glucose up to six times daily. Dx: 250.03   1 kit   0   . glucose blood (ACCU-CHEK SMARTVIEW) test strip      Use to test blood glucose up to six times daily. Dx 250.03   100 each   12    BP 120/91  Pulse 112  Temp(Src) 98.2 F (36.8 C) (Oral)  Resp 20  SpO2 100%  LMP 03/14/2013 Physical Exam  Nursing note and vitals reviewed. Constitutional: She is oriented to person, place, and time. She appears well-developed and well-nourished. No distress.  HENT:  Head: Normocephalic and  atraumatic.  Mouth/Throat: Oropharynx is clear and moist.  Eyes: Pupils are equal, round, and reactive to light.  Neck: Normal range of motion. Neck supple.  Cardiovascular: Regular rhythm and normal heart sounds.  Tachycardia present.  Exam reveals no gallop and no friction rub.   No murmur heard. Pulmonary/Chest: Effort normal and breath sounds normal. No respiratory distress. She has no wheezes.  Abdominal: Soft. Bowel sounds are normal. She exhibits no distension. There is no tenderness. There is no guarding.  Neurological: She is alert and oriented to person, place, and time. She exhibits normal muscle tone. Coordination normal.  Skin: Skin is warm and dry. No rash noted. No erythema.    ED Course   Procedures (including critical care time)  Labs Reviewed  BASIC METABOLIC PANEL - Abnormal; Notable for the following:    Sodium 134 (*)    CO2 12 (*)    Glucose, Bld 229 (*)    All other components within normal limits  GLUCOSE, CAPILLARY - Abnormal; Notable for the following:    Glucose-Capillary 175 (*)    All other components within normal limits  CBC  PRO B NATRIURETIC PEPTIDE  D-DIMER, QUANTITATIVE  POCT PREGNANCY, URINE  POCT I-STAT TROPONIN I   Dg Chest Port 1 View  03/28/2013   *RADIOLOGY REPORT*  Clinical Data: Chest pain.  PORTABLE CHEST - 1 VIEW  Comparison: 05/30/2010  Findings: Heart and mediastinal contours are within normal limits. No focal opacities or effusions.  No acute bony abnormality.  IMPRESSION: No active cardiopulmonary disease.   Original Report Authenticated By: Charlett Nose, M.D.   Patient's been observed for many hours here in the emergency department.  She initially had, what appeared to be mild early DKA.  On repeat check of her laboratory tests.  She has corrected with fluids.  She is feeling better at this time.  She is no longer feeling short of breath.  Patient, states her blood sugar and 1 drop dramatically, especially over the last few months.   Patient, states, that the extra dose of insulin may have caused this, which it has in the past.  Patient does, here to be in less distress than initially, when she presented to the emergency department.  MDM   Date: 03/28/2013  Rate: 113  Rhythm: sinus tachycardia  QRS Axis: normal  Intervals: normal  ST/T Wave abnormalities: normal  Conduction Disutrbances:none  Narrative Interpretation:  Old EKG Reviewed: unchanged  MDM Reviewed: vitals, nursing note and previous chart Reviewed previous: labs Interpretation: labs, ECG and x-ray       Carlyle Dolly, PA-C 03/28/13 1518

## 2013-03-28 NOTE — ED Notes (Signed)
Checked patient cbg it was 49 notified RN Timmie Foerster

## 2013-04-15 ENCOUNTER — Encounter (HOSPITAL_COMMUNITY): Payer: Self-pay | Admitting: Neurology

## 2013-04-15 ENCOUNTER — Emergency Department (HOSPITAL_COMMUNITY): Payer: Medicaid Other

## 2013-04-15 ENCOUNTER — Emergency Department (HOSPITAL_COMMUNITY)
Admission: EM | Admit: 2013-04-15 | Discharge: 2013-04-15 | Disposition: A | Discharge status: Home / Self Care | Payer: Medicaid Other | Attending: Emergency Medicine | Admitting: Emergency Medicine

## 2013-04-15 DIAGNOSIS — M25569 Pain in unspecified knee: Secondary | ICD-10-CM | POA: Insufficient documentation

## 2013-04-15 DIAGNOSIS — M25561 Pain in right knee: Secondary | ICD-10-CM

## 2013-04-15 DIAGNOSIS — R52 Pain, unspecified: Secondary | ICD-10-CM | POA: Insufficient documentation

## 2013-04-15 DIAGNOSIS — Z794 Long term (current) use of insulin: Secondary | ICD-10-CM | POA: Insufficient documentation

## 2013-04-15 DIAGNOSIS — E1065 Type 1 diabetes mellitus with hyperglycemia: Secondary | ICD-10-CM | POA: Insufficient documentation

## 2013-04-15 DIAGNOSIS — F3289 Other specified depressive episodes: Secondary | ICD-10-CM | POA: Insufficient documentation

## 2013-04-15 DIAGNOSIS — E1049 Type 1 diabetes mellitus with other diabetic neurological complication: Secondary | ICD-10-CM | POA: Insufficient documentation

## 2013-04-15 DIAGNOSIS — E1142 Type 2 diabetes mellitus with diabetic polyneuropathy: Secondary | ICD-10-CM | POA: Insufficient documentation

## 2013-04-15 DIAGNOSIS — Z8719 Personal history of other diseases of the digestive system: Secondary | ICD-10-CM | POA: Insufficient documentation

## 2013-04-15 DIAGNOSIS — E785 Hyperlipidemia, unspecified: Secondary | ICD-10-CM | POA: Insufficient documentation

## 2013-04-15 DIAGNOSIS — F329 Major depressive disorder, single episode, unspecified: Secondary | ICD-10-CM | POA: Insufficient documentation

## 2013-04-15 DIAGNOSIS — Z79899 Other long term (current) drug therapy: Secondary | ICD-10-CM | POA: Insufficient documentation

## 2013-04-15 DIAGNOSIS — Z8739 Personal history of other diseases of the musculoskeletal system and connective tissue: Secondary | ICD-10-CM | POA: Insufficient documentation

## 2013-04-15 DIAGNOSIS — Z880 Allergy status to penicillin: Secondary | ICD-10-CM | POA: Insufficient documentation

## 2013-04-15 DIAGNOSIS — G4700 Insomnia, unspecified: Secondary | ICD-10-CM | POA: Insufficient documentation

## 2013-04-15 DIAGNOSIS — Z872 Personal history of diseases of the skin and subcutaneous tissue: Secondary | ICD-10-CM | POA: Insufficient documentation

## 2013-04-15 MED ORDER — HYDROCODONE-ACETAMINOPHEN 5-325 MG PO TABS
1.0000 | ORAL_TABLET | Freq: Once | ORAL | Status: AC
  Administered 2013-04-15: 1 via ORAL
  Filled 2013-04-15: qty 1

## 2013-04-15 MED ORDER — TRAMADOL HCL 50 MG PO TABS: 50.0000 mg | ORAL_TABLET | Freq: Four times a day (QID) | ORAL | Status: DC | PRN

## 2013-04-15 NOTE — ED Notes (Signed)
Pt report for about 2 weeks her right knee has been bothering her, hurts to walk and bend leg. Reports she has tried ice packs/heat/elevation but nothing has been working. Pt in nad, skin warm and dry, resp e/u.

## 2013-04-15 NOTE — ED Provider Notes (Signed)
Medical screening examination/treatment/procedure(s) were performed by non-physician practitioner and as supervising physician I was immediately available for consultation/collaboration.   Glynn Octave, MD 04/15/13 [phone]

## 2013-04-15 NOTE — ED Notes (Signed)
Right knee pain x 2 weeks. Denies injury. Pain with walking. Has tried ice, heat, ibuprofen with no relief.

## 2013-04-15 NOTE — ED Notes (Signed)
Pt returned from radiology.

## 2013-04-15 NOTE — ED Provider Notes (Signed)
CSN: [id number]     Arrival date & time 04/15/13  1914 History     First MD Initiated Contact with Patient 04/15/13 [phone]     Chief Complaint  Patient presents with  . Knee Pain   (Consider location/radiation/quality/duration/timing/severity/associated sxs/prior Treatment) The history is provided by the patient and medical records.   Pt presents to the ED with mom for right knee pain x 2 weeks.  Pt does not remember any specific injury, trauma, or falls but notes that she does have to climb over a baby gate several times a day in her house-- unsure if she twisted it somehow.  Over the past 2 weeks knee has felt increasingly weak with a sharp pain in the "inside" of her knee, worse with weight bearing and ambulation.  Pt states her knee has "given out" on her 2-3 lately causing her to feel like she is going to fall.  Denies any numbness or paresthesias of RLE.  No prior right knee injury or surgeries.  Pt has taken OTC aleve and followed RICE routine without relief.  Pt just started college at [employer] and is concerned about her mobility for getting around campus.  Past Medical History  Diagnosis Date  . Foot deformity, acquired     little toes, toes nail are falling off, needs surgery, but cant happen till AIC < 9  . Fatty liver   . Diabetic neuropathy 01/08/2012  . Depression 12/25/2011  . Diabetes type 1, uncontrolled 08/27/1997  . Hyperlipidemia LDL goal < 100 12/19/2011  . Superficial skin infection 12/18/2011    Recurrent for past 2 years   . Insomnia 01/30/2012   Past Surgical History  Procedure Laterality Date  . Wrist surgery      rerouted tendons in wrist   Family History  Problem Relation Age of Onset  . Diabetes Maternal Grandfather    History  Substance Use Topics  . Smoking status: Never Smoker   . Smokeless tobacco: Not on file  . Alcohol Use: No   OB History   Grav Para Term Preterm Abortions TAB SAB Ect Mult Living                 Review of Systems   Musculoskeletal: Positive for arthralgias.  All other systems reviewed and are negative.    Allergies  Gabapentin (phn) and Penicillins  Home Medications   Current Outpatient Rx  Name  Route  Sig  Dispense  Refill  . Blood Glucose Monitoring Suppl (ACCU-CHEK NANO SMARTVIEW) W/DEVICE KIT   Other   1 strip by Other route 6 (six) times daily. Use to test blood glucose up to six times daily. Dx: 250.03   1 kit   0   . esomeprazole (NEXIUM) 40 MG capsule   Oral   Take 40 mg by mouth 2 (two) times daily as needed (Acid reflux).          Marland Kitchen glucose blood (ACCU-CHEK SMARTVIEW) test strip      Use to test blood glucose up to six times daily. Dx 250.03   100 each   12   . insulin aspart (NOVOLOG) 100 UNIT/ML injection      Injects 10 units for breakfast  and lunch, 20-25 units for dinner, 10-15 units 1-2 times a day for snacks up to 100 total units per day. ICR is 1:3 for breakfast, 1:2 for lunch and 1: 3 for dinner.   10 pen   5   . insulin glargine (LANTUS) 100  UNIT/ML injection   Subcutaneous   Inject 60 Units into the skin at bedtime.          Marland Kitchen neomycin-bacitracin-polymyxin (NEOSPORIN) ointment   Topical   Apply 1 application topically at bedtime. Apply to affected areas on arms         . norgestrel-ethinyl estradiol (LO/OVRAL,CRYSELLE) 0.3-30 MG-MCG tablet   Oral   Take 1 tablet by mouth daily.         . pregabalin (LYRICA) 50 MG capsule   Oral   Take 50 mg by mouth 2 (two) times daily.         Marland Kitchen venlafaxine XR (EFFEXOR XR) 75 MG 24 hr capsule   Oral   Take 2 capsules (150 mg total) by mouth daily.   180 capsule   4   . zolpidem (AMBIEN) 5 MG tablet   Oral   Take 5 mg by mouth at bedtime as needed for sleep.          BP 120/89  Pulse 108  Temp(Src) 98.2 F (36.8 C) (Oral)  Resp 18  Ht 5\' 3"  (1.6 m)  Wt 150 lb (68.04 kg)  BMI 26.58 kg/m2  SpO2 100%  LMP 03/07/2013  Physical Exam  Nursing note and vitals reviewed. Constitutional: She is  oriented to person, place, and time. She appears well-developed and well-nourished. No distress.  HENT:  Head: Normocephalic and atraumatic.  Mouth/Throat: Oropharynx is clear and moist.  Eyes: Conjunctivae and EOM are normal. Pupils are equal, round, and reactive to light.  Neck: Normal range of motion. Neck supple.  Cardiovascular: Normal rate, regular rhythm and normal heart sounds.   Pulmonary/Chest: Effort normal and breath sounds normal. No respiratory distress. She has no wheezes.  Musculoskeletal: She exhibits no edema.       Right knee: She exhibits decreased range of motion. She exhibits no swelling, no effusion, no ecchymosis, no deformity, no laceration, no erythema, normal alignment and no LCL laxity. Tenderness found. Medial joint line tenderness noted.  TTP along medial joint line; Limited flexion, pain with external rotation of knee; no bruising, swelling, or other signs of trauma; strong distal pulse, sensation intact  Neurological: She is alert and oriented to person, place, and time.  Skin: Skin is warm and dry. She is not diaphoretic.  Psychiatric: She has a normal mood and affect.    ED Course   Procedures (including critical care time)  Labs Reviewed - No data to display Dg Knee Complete 4 Views Right  04/15/2013   *RADIOLOGY REPORT*  Clinical Data: Knee pain  RIGHT KNEE - COMPLETE 4+ VIEW  Comparison: None  Findings: No fracture or dislocation of the left knee.  No joint effusion.  No arthropathy.  IMPRESSION: No acute osseous abnormality.   Original Report Authenticated By: Genevive Bi, M.D.   1. Knee pain, acute, right     MDM   X-ray negative for acute fx or dislocation.  Knee sleeve placed and crutches given.  Rx tramadol.  Instructed to FU with orthopedics, Dr. Eulah Pont-- mom concerned about this due to insurance status.  I have advised that if she has problems or needs official referral, contact her PCP.  Continue following RICE routine at home for added  relief.  Discussed plan with pt and mom, they agreed.  Return precautions advised.  Garlon Hatchet, PA-C 04/15/13 1014

## 2013-04-20 ENCOUNTER — Encounter: Payer: Self-pay | Admitting: Internal Medicine

## 2013-04-20 ENCOUNTER — Ambulatory Visit (INDEPENDENT_AMBULATORY_CARE_PROVIDER_SITE_OTHER): Payer: Medicaid Other | Admitting: Internal Medicine

## 2013-04-20 ENCOUNTER — Encounter: Payer: Self-pay | Admitting: Licensed Clinical Social Worker

## 2013-04-20 VITALS — BP 122/88 | HR 111 | Temp 98.4°F | Wt 161.8 lb

## 2013-04-20 DIAGNOSIS — Z6825 Body mass index (BMI) 25.0-25.9, adult: Secondary | ICD-10-CM

## 2013-04-20 DIAGNOSIS — E1065 Type 1 diabetes mellitus with hyperglycemia: Secondary | ICD-10-CM

## 2013-04-20 DIAGNOSIS — L089 Local infection of the skin and subcutaneous tissue, unspecified: Secondary | ICD-10-CM

## 2013-04-20 DIAGNOSIS — M21969 Unspecified acquired deformity of unspecified lower leg: Secondary | ICD-10-CM

## 2013-04-20 DIAGNOSIS — L989 Disorder of the skin and subcutaneous tissue, unspecified: Secondary | ICD-10-CM

## 2013-04-20 DIAGNOSIS — S8990XA Unspecified injury of unspecified lower leg, initial encounter: Secondary | ICD-10-CM

## 2013-04-20 DIAGNOSIS — S8991XA Unspecified injury of right lower leg, initial encounter: Secondary | ICD-10-CM

## 2013-04-20 DIAGNOSIS — F329 Major depressive disorder, single episode, unspecified: Secondary | ICD-10-CM

## 2013-04-20 DIAGNOSIS — E663 Overweight: Secondary | ICD-10-CM

## 2013-04-20 NOTE — Patient Instructions (Addendum)
-  Follow up with Rudell Cobb for the [facility] card.  -You need an MRI of your knee. We will let you know of the appointment date and time.  -Continue using rest, ice/heat, leg elevation and take Meloxicam for the inflammation and pain in your knee. Avoid taking Ibuprofen while you are taking Meloxicam.  -You have been referred to Dermatology through the [facility]. We need the records from [facility] before this referral can be completed.  -Follow up with Dr. Dierdre Searles in 1-2 months or sooner with the [facility] if you have acute medical problems.

## 2013-04-20 NOTE — Progress Notes (Signed)
Christine Dunn was referred to CSW for mental health resources.  CSW met with Christine Dunn and her mother was alongside.  Pt deferred several questions to mother.  When CSW address pt with questions, pt looks to mother to answer.  Currently, Christine Dunn has family planning medicaid only and plans to apply for the [facility] card on 8/27 with financial counselor.  Christine Dunn has ACE bandage/brace around her knee and is sitting in a transport chair.  Mother states pt is unable to wait all day at [facility] to establish mental health services due to knee condition and is in need of both a therapist and psychiatrist which she can schedule an appt.  Pt has been seen at [facility] in the past, [name].  CSW informed pt/mother once pt is approved for the [facility], CSW will be able to make referral to [facility].  Pt in agreement.  CSW provided pt with number to [facility] as a 24/hr, 7/day Crisis Line.  Informed pt/mother, shoud pt not received [facility] Card will explore alternatives at that time.

## 2013-04-21 DIAGNOSIS — S8991XA Unspecified injury of right lower leg, initial encounter: Secondary | ICD-10-CM | POA: Insufficient documentation

## 2013-04-21 LAB — MICROALBUMIN / CREATININE URINE RATIO
Creatinine, Urine: 260.5 mg/dL
Microalb, Ur: 1.76 mg/dL (ref 0.00–1.89)

## 2013-04-21 NOTE — Assessment & Plan Note (Signed)
Referred to Orthopedic Surgery. Pt no longer has Medicaid and is unable to afford Podiatry services.

## 2013-04-21 NOTE — Assessment & Plan Note (Signed)
She has lost 12 lbs since last year and continues to follow a healthy diet. Due to her right knee injury she is currently unable to exercise.

## 2013-04-21 NOTE — Assessment & Plan Note (Signed)
Referral to CSW. Pt will be referred to [facility] once she has her [employer].

## 2013-04-21 NOTE — Assessment & Plan Note (Addendum)
Twisting motion on 8/20 with severe acute pain. Physical exam with medial knee tenderness, increased pain with valgus rotation, concerning for medial meniscal injury or medial ligament injury. Pt was seen by Orthopedic Surgery PA with recs for MRI but pt is currently uninsured, applying for the [employer].  Ordered MRI for 8/28, hopefully she will have the [facility] by the. Pt aware that she needs the [employer] before 8/28.  Referral to Orthopedic Surgery through [facility].  -Pt to continue using RICE tx (rest, ice, compression with knee sleeve, and elevation).  -Pt to continue taking Meloxicam 15mg  daily for the inflammation. Pt advised to not take NSAIDs, including Advil, while taking Meloxicam. (She will continue taking Nexium for GERD).   -Pt will also continue taking Tramadol 50mg  q6h PRN for pain (declines refill for this Rx).

## 2013-04-21 NOTE — Assessment & Plan Note (Addendum)
Lab Results  Component Value Date   HGBA1C 8.0 04/20/2013   HGBA1C 9.0 11/20/2012   HGBA1C 9.0 08/25/2012     Assessment: Diabetes control:  Uncontrolled Progress toward A1C goal:   Improved Comments: She continues to use Lantus 60 units qHS with Novolog SSI based on carb count. She continues to follow a healthier diet and has lost some weight, over 10lbs since she started her weight loss plan last year. (Of note, weight during this visit 161 lbs due to pt's imbalance/cructhes, pt weight was 151lbs 5 days ago). Denies hypoglycemia.   Plan: Medications:  continue current medications Home glucose monitoring: Frequency:   Timing:   Instruction/counseling given: reminded to bring blood glucose meter & log to each visit, reminded to bring medications to each visit and discussed the need for weight loss Educational resources provided: brochure Self management tools provided: home glucose logbook Other plans: Pt did not have her CBG meter during this visit. Checked urine micro albumin/Cr during this visit. Will follow up with her PCP in 1-2 months.

## 2013-04-21 NOTE — Progress Notes (Signed)
Subjective:    Patient ID: Christine Dunn, female    DOB: 1991-05-16, 22 y.o.   MRN: [id number]  HPI [name] is a pleasant 22 year old woman with PMH significant for DM1, depression, and chronic skin lesions who presents, accompanied by her mother, for follow up visit for right knee pain. She was seen in the ED on 8/20 for acute right knee pain. She explains that her pain started after a twisting motion while she was climbing over a "baby gate". She denies a popping sensation at that time but did feel sharp pain. The pain has been constant ache since then. The knee has been swollen but this improved with rest, leg elevation and knee wrapping. She feels like her knee wants to give out at times and has been using crutches to stabilize her gait.  She was seen at Dr. Greig Right office, in Orthopedic Surgery, for this problem and was told that she needed an MRI. She is not sure if she can afford this study as she currently only has Medicaid Family Planning coverage.  She is depressed and would like referral to [facility]. She denies SI/HI.  For years now she has had chronic skin lesions. She was seen at [facility] with skin biopsy. She is not sure what her diagnosis is as she was unable to follow up with them due to loss of full Medicaid coverage.     Review of Systems  Constitutional: Negative for fever, chills, diaphoresis, activity change, appetite change, fatigue and unexpected weight change.  Respiratory: Negative for cough, shortness of breath and wheezing.   Cardiovascular: Negative for chest pain, palpitations and leg swelling.  Gastrointestinal: Negative for abdominal pain.  Genitourinary: Negative for dysuria.  Musculoskeletal: Positive for arthralgias and gait problem.       Right knee pain  Skin: Positive for rash. Negative for color change, pallor and wound.       Diffuse chronic skin nodules  Neurological: Negative for dizziness, light-headedness and headaches.   Hematological: Negative for adenopathy.  Psychiatric/Behavioral: Negative for behavioral problems, confusion and agitation. The patient is not nervous/anxious.        Objective:   Physical Exam  Nursing note and vitals reviewed. Constitutional: She is oriented to person, place, and time. She appears well-developed and well-nourished. No distress.  Eyes: Conjunctivae are normal. No scleral icterus.  Cardiovascular: Normal rate and regular rhythm.   Pulmonary/Chest: Effort normal and breath sounds normal. No respiratory distress. She has no wheezes. She has no rales.  Abdominal: Soft.  Musculoskeletal: She exhibits tenderness. She exhibits no edema.  Right knee with normal ROM but pain with knee flexion. Medial knee tenderness with valgus rotation and pressure applied to the lateral knee.  TTP of the medial joint space.  No edema, increased warmth or erythema, no skin abrasions or bruises.  Anterior and posterior laxity intact. Laxity also intact with varus and valgus rotation of the knee.    Neurological: She is alert and oriented to person, place, and time.  Skin: Skin is warm and dry. No rash noted. She is not diaphoretic. No erythema. No pallor.  Multiple papules of ~1cm with purplish discoloration covering her arms and legs, spears her face and hands with no erythematous base, discharge, or bleeding.  A ~3cmx3cm patch of a scaly rash is noted on her left forearm.   Psychiatric: She has a normal mood and affect. Her behavior is normal.          Assessment &  Plan:

## 2013-04-21 NOTE — Assessment & Plan Note (Addendum)
Pt will request notes from [facility] (especially skin biopsy results) to be faxed in to [place].  Pt referred to [facility] via [facility] but will need to finalize application to [employer].  If unable to be seen in [place], pt is very interested in being seen at [facility]. Pending skin biopsy findings, Rheumatology referral also to be considered.

## 2013-04-22 ENCOUNTER — Ambulatory Visit: Payer: Medicaid Other

## 2013-04-23 ENCOUNTER — Ambulatory Visit: Payer: No Typology Code available for payment source

## 2013-04-23 NOTE — Progress Notes (Signed)
Case discussed with Dr. Kennerly soon after the resident saw the patient.  We reviewed the resident's history and exam and pertinent patient test results.  I agree with the assessment, diagnosis, and plan of care documented in the resident's note. 

## 2013-04-24 ENCOUNTER — Telehealth: Payer: Self-pay | Admitting: Internal Medicine

## 2013-04-24 ENCOUNTER — Ambulatory Visit (HOSPITAL_COMMUNITY)
Admission: RE | Admit: 2013-04-24 | Discharge: 2013-04-24 | Disposition: A | Discharge status: Home / Self Care | Payer: No Typology Code available for payment source | Source: Ambulatory Visit | Attending: Internal Medicine | Admitting: Internal Medicine

## 2013-04-24 DIAGNOSIS — S8991XA Unspecified injury of right lower leg, initial encounter: Secondary | ICD-10-CM

## 2013-04-24 DIAGNOSIS — X500XXA Overexertion from strenuous movement or load, initial encounter: Secondary | ICD-10-CM | POA: Insufficient documentation

## 2013-04-24 DIAGNOSIS — M25569 Pain in unspecified knee: Secondary | ICD-10-CM | POA: Insufficient documentation

## 2013-04-24 NOTE — Telephone Encounter (Signed)
I discussed the findings of the pt's right knee MRI of stress fracture of the medial tibial metaphysis with Estanislado Spire, PA for Orthopedic Surgery. He agreed to call the patient with instruction for her to wear a knee immobilizer and with weight bearing of her right knee until she is seen in the Orthopedic Surgery office on Tuesday 9/2 (the office is closed on Monday due to the Labor Day holiday).  Per Purnell Shoemaker at the [facility], the pt's mom has called the [facility] clinic and will pick up the knee immobilizer this afternoon or on Tuesday.

## 2013-04-28 NOTE — Progress Notes (Signed)
Pt has been approved for [employer].  CSW has made referral to [facility] for psychiatry and therapy.  Awaiting response from [facility].

## 2013-04-29 ENCOUNTER — Encounter: Payer: Self-pay | Admitting: Licensed Clinical Social Worker

## 2013-04-29 NOTE — Progress Notes (Signed)
Pt has been scheduled with [facility].  CSW placed call and left message with pt's mother noting dates and times.  CSW will send letter out today.

## 2013-05-28 ENCOUNTER — Ambulatory Visit (HOSPITAL_COMMUNITY): Payer: Self-pay | Admitting: Psychiatry

## 2013-06-09 ENCOUNTER — Ambulatory Visit (INDEPENDENT_AMBULATORY_CARE_PROVIDER_SITE_OTHER): Payer: No Typology Code available for payment source | Admitting: Psychiatry

## 2013-06-09 ENCOUNTER — Encounter (INDEPENDENT_AMBULATORY_CARE_PROVIDER_SITE_OTHER): Payer: Self-pay

## 2013-06-09 DIAGNOSIS — F339 Major depressive disorder, recurrent, unspecified: Secondary | ICD-10-CM

## 2013-06-09 NOTE — Progress Notes (Signed)
[facility] Biopsychosocial Assessment  Christine Dunn 22 y.o. 06/09/2013   Referred by: Primary Care Physician  (Dr. Dierdre Searles)   PRESENTING PROBLEM Chief Complaint: Depression associated with feeling overwhelmed with school (college courses).  What are the main stressors in your life right now?  Depression  3   Describe a brief history of your present symptoms: Pt is experiencing all 9 depression symptoms, including thoughts of suicide occassionally without intent, plans or means.   How long have you had these symptoms?: Pt has had depression on and off for the past four years but this year symptoms worsened associated with an increase in academic load in college.  What effect have they had on your life?: Feeling overwhelmed, hopeless, irritable and thoughts of suicide.    FAMILY ASSESSMENT Was the significant other/family member interviewed? No If No, why?: NA Is significant other/family member supportive? NA Did significant other/family member express concerns for the patient? NA If Yes, describe: NA  Is significant other/family member willing to be part of treatment plan? NA Describe significant other/family member's perception of patient's illness: NA  Describe significant other/family member's perception of expectations with treatment: NA   MENTAL HEALTH HISTORY Have you ever been treated for a mental health problem? Yes  If Yes, when? Four years ago , where? [facility], by whom? Christine Dunn  Are you currently seeing a therapist or counselor? No If Yes, whom? NA Have you ever had a mental health hospitalization? No  Have you ever had suicidal thoughts or attempted suicide? Yes If Yes, when? On and off currently  Describe No intent plans or means and it is associated with feeling overwhelmed with classes in school. Pt said she "just wants to stop feeling overwhelmed".   Have you ever been treated with medication for a mental health problem?  Yes If Yes, please list as completely as possible (name of medication, reason prescribed, and response: Zoloft. Pt is unsure if it is effective and has an appointment tomorrow with Christine Dunn to discuss medication options.    FAMILY MENTAL HEALTH HISTORY Is there any history of mental health problems or substance abuse in your family? Yes If Yes, please explain (include information on parents, siblings, aunts/uncles, grandparents, cousins, etc.): Brother - alcoholic and Depression, Mother - Depression, Aunt - Bipolar Disorder Has anyone in your family been hospitalized for mental health problems? Yes If Yes, please explain (including who, where, and for what length of time): Aunt for Bipolar symptoms   MARITAL STATUS Are you presently: Single How many times have you been married? 0   Do you have any children? No    LEISURE/RECREATION Describe how patient spends leisure time: Pt has a boyfriend who she has been dating for the past month and she enjoys spending time with him on the weekends.    SOCIAL AND FAMILY HISTORY Who lives in your current household? Mother and self Where were you born? [place]  Where did you grow up? [place]    Do you have siblings, step/half siblings? Yes If Yes, please list names, sex and ages: Brother Arlys John age 49, Sister [place] age 108 Are your parents still living? Yes   Where do your parents live? Mother lives in [place] and Father lives in [place] Do you see them often? Yes If No, why not? Mother daily and father on weekends  Are your parents separated/divorced? Yes  Have you ever been exposed to any form of abuse? No   Are you having  problems with any member or your family? No    Have you ever been in the [employer]? No     Do you have any legal problems/involvements? No    EDUCATIONAL BACKGROUND How many grades have you completed? technical college Pt is in her third year at [employer] in pursuit  of her degree in physical therapy.     Is there anything else you would like to tell us? Pt is a 22 year old single, caucasian female referred for counseling treatment by her PCP (Dr. Dierdre Searles) for the treatment of depression symptoms with occasional thoughts of suicide. Pt has on history of depression and was treated several years ago by Dr. Lucianne Dunn at [facility] outpatient clinic. Pt reports depression associated with feeling "overwhelmed with school" as her primary reason for treatment. Pt said her mother has noticed her isolating more in her room and an increase in irritability. Pt said she has been afraid to tell her mom her depression has increased and she feels overwhelmed by school because she does not want to disappoint her mother of feeling like she can't "handle it". Pt has a strong desire to please her mom and be seen as the "good child" since her other two siblings are less responsible. Pt agreed to weekly counseling to explore ways to reduce feelings of depression, learn ways to alter her daily routine to get her out of her house more and ways to communicate her feelings more directly to her mother to get support.   Christine Ching, LCSW 06/09/2013

## 2013-06-10 ENCOUNTER — Ambulatory Visit (INDEPENDENT_AMBULATORY_CARE_PROVIDER_SITE_OTHER): Payer: No Typology Code available for payment source | Admitting: Psychiatry

## 2013-06-10 ENCOUNTER — Encounter (HOSPITAL_COMMUNITY): Payer: Self-pay | Admitting: Psychiatry

## 2013-06-10 VITALS — BP 106/75 | HR 78 | Ht 64.0 in | Wt 160.6 lb

## 2013-06-10 DIAGNOSIS — F329 Major depressive disorder, single episode, unspecified: Secondary | ICD-10-CM

## 2013-06-10 DIAGNOSIS — F339 Major depressive disorder, recurrent, unspecified: Secondary | ICD-10-CM

## 2013-06-10 MED ORDER — VENLAFAXINE HCL ER 75 MG PO CP24: 150.0000 mg | ORAL_CAPSULE | Freq: Every day | ORAL | Status: DC

## 2013-06-10 NOTE — Progress Notes (Signed)
[facility] Initial Assessment Note  Christine Dunn [id number] 22 y.o.  06/10/2013 3:03 PM  Chief Complaint:  I have depression and anxiety.  My doctor recommended to see psychiatrist.    History of Present Illness:  Patient is a 22 year old Caucasian single student at [employer] came for her for initial appointment.  She was recommended by her primary care physician [name] for management of depression.  Patient endorsed long history of depression which has been getting worse the past 2 years.  Her primary care physician switched her medication.  She was taking Zoloft with limited response.  She is taking Effexor 75 mg for at least one year.  Patient endorsed history of scratching herself when she is under a lot of anxiety and nervousness.  She endorsed sometimes feeling hopeless and helpless however she denies ever having suicidal attempt or plan.  Patient endorsed periods of depression which usually last one week to 2 week, she described those periods that he disturbing because she gets isolated, withdrawn seclusive and shut down.  She did remember having that episode 2 weeks ago.  She also endorsed nervousness and anxiety most of the time.  She did not report any specific triggers for anxiety and depressive symptoms but admitted poor self-esteem, burden to her family and poor self image.  She also endorsed feeling overwhelmed with her chronic diabetes.  She has been diagnosed with diabetes since age 48.  She is using insulin because her sugar is always difficult to control.  Patient admitted recently for sleep, racing thoughts, some crying spells but denies any aggression, violence, paranoia, psychosis.  She has not involved in any self scratching behavior in recent weeks.  She endorsed that sometimes she scratch her skin so bad that she requires medication for healing.  She denies the scratches superficially and never require any stitches.  Patient denies any violence, aggression, mania,  psychosis, hallucination, or excessive compulsive symptoms or any PTSD symptoms.    Suicidal Ideation: No Plan Formed: No Patient has means to carry out plan: No  Homicidal Ideation: No Plan Formed: No Patient has means to carry out plan: No  Past Psychiatric History/Hospitalization(s) Patient has seen psychiatrist in this office many years ago.  She was seeing [name] for depression.  2 prescribe Zoloft until 2 years ago it stopped working.  Patient denies any history of suicidal attempt, inpatient psychiatric treatment, mania, psychosis or any hallucination.  She endorses history of scratching herself causing injury to her skin.  She denies any history of mood swing, aggression, violence or any impulsive behavior.  In the past she has taken trazodone but did not work. Anxiety: Yes Bipolar Disorder: No Depression: Yes Mania: No Psychosis: No Schizophrenia: No Personality Disorder: No Hospitalization for psychiatric illness: No History of Electroconvulsive Shock Therapy: No Prior Suicide Attempts: No  Medical History; The patient has insulin diabetes mellitus, neuropathy, hyperlipidemia and weight gain.  She see [name] for her primary care needs.  Traumatic brain injury: Denies.  Family History; Patient endorsed mother has depression.  Education and Work History; Patient is a Higher education careers adviser.  Psychosocial History; Patient was born and raised in [place].  Her parents are divorced.  She lives with her mother.  She has an older sister and a brother.  Patient is in a relationship with a boyfriend.  Patient endorsed boyfriend is supportive.  She has no children.  Legal History; Denies  History Of Abuse; Denies  Substance Abuse History; Patient denies any  history of illegal substance use.  She claims to drink only on social occasions.  She denies any binge drinking.   Review of Systems: Psychiatric: Agitation: No Hallucination: No Depressed Mood: Yes Insomnia:  Yes Hypersomnia: No Altered Concentration: No Feels Worthless: No Grandiose Ideas: No Belief In Special Powers: No New/Increased Substance Abuse: No Compulsions: No  Neurologic: Headache: No Seizure: No Paresthesias: Yes    Outpatient Encounter Prescriptions as of 06/10/2013  Medication Sig Dispense Refill  . esomeprazole (NEXIUM) 40 MG capsule Take 40 mg by mouth daily.       Marland Kitchen ibuprofen (ADVIL,MOTRIN) 200 MG tablet Take 400 mg by mouth once.      . insulin aspart (NOVOLOG) 100 UNIT/ML injection Inject 5 Units into the skin 3 (three) times daily before meals. Injects 10 units for breakfast  and lunch, 20-25 units for dinner, 10-15 units 1-2 times a day for snacks up to 100 total units per day. ICR is 1:3 for breakfast, 1:2 for lunch and 1: 3 for dinner.      . insulin glargine (LANTUS) 100 UNIT/ML injection Inject 60 Units into the skin at bedtime.       Marland Kitchen neomycin-bacitracin-polymyxin (NEOSPORIN) ointment Apply 1 application topically at bedtime. Apply to affected areas on arms      . norgestrel-ethinyl estradiol (LO/OVRAL,CRYSELLE) 0.3-30 MG-MCG tablet Take 1 tablet by mouth daily.      . pregabalin (LYRICA) 50 MG capsule Take 50 mg by mouth 2 (two) times daily.      Marland Kitchen zolpidem (AMBIEN) 5 MG tablet Take 5 mg by mouth at bedtime as needed for sleep.      . traMADol (ULTRAM) 50 MG tablet Take 1 tablet (50 mg total) by mouth every 6 (six) hours as needed for pain.  15 tablet  0  . venlafaxine XR (EFFEXOR XR) 75 MG 24 hr capsule Take 2 capsules (150 mg total) by mouth daily.  30 capsule  0   No facility-administered encounter medications on file as of 06/10/2013.    Recent Results (from the past 2160 hour(s))  CBC     Status: None   Collection Time    03/28/13 10:01 AM      Result Value Range   WBC 6.9  4.0 - 10.5 K/uL   RBC 4.53  3.87 - 5.11 MIL/uL   Hemoglobin 14.0  12.0 - 15.0 g/dL   HCT 16.1  09.6 - 04.5 %   MCV 89.8  78.0 - 100.0 fL   MCH 30.9  26.0 - 34.0 pg   MCHC  34.4  30.0 - 36.0 g/dL   RDW 40.9  81.1 - 91.4 %   Platelets 337  150 - 400 K/uL  PRO B NATRIURETIC PEPTIDE     Status: None   Collection Time    03/28/13 10:01 AM      Result Value Range   Pro B Natriuretic peptide (BNP) 8.7  0 - 125 pg/mL  POCT I-STAT TROPONIN I     Status: None   Collection Time    03/28/13 10:06 AM      Result Value Range   Troponin i, poc 0.00  0.00 - 0.08 ng/mL   Comment 3            Comment: Due to the release kinetics of cTnI,     a negative result within the first hours     of the onset of symptoms does not rule out     myocardial infarction with certainty.  If myocardial infarction is still suspected,     repeat the test at appropriate intervals.  POCT PREGNANCY, URINE     Status: None   Collection Time    03/28/13 10:08 AM      Result Value Range   Preg Test, Ur NEGATIVE  NEGATIVE   Comment:            THE SENSITIVITY OF THIS     METHODOLOGY IS >24 mIU/mL  BASIC METABOLIC PANEL     Status: Abnormal   Collection Time    03/28/13 10:13 AM      Result Value Range   Sodium 134 (*) 135 - 145 mEq/L   Potassium 4.0  3.5 - 5.1 mEq/L   Chloride 96  96 - 112 mEq/L   CO2 12 (*) 19 - 32 mEq/L   Glucose, Bld 229 (*) 70 - 99 mg/dL   BUN 8  6 - 23 mg/dL   Creatinine, Ser 1.61  0.50 - 1.10 mg/dL   Calcium 9.5  8.4 - 09.6 mg/dL   GFR calc non Af Amer >90  >90 mL/min   GFR calc Af Amer >90  >90 mL/min   Comment:            The eGFR has been calculated     using the CKD EPI equation.     This calculation has not been     validated in all clinical     situations.     eGFR's persistently     <90 mL/min signify     possible Chronic Kidney Disease.  D-DIMER, QUANTITATIVE     Status: None   Collection Time    03/28/13 10:13 AM      Result Value Range   D-Dimer, Quant 0.34  0.00 - 0.48 ug/mL-FEU   Comment:            AT THE INHOUSE ESTABLISHED CUTOFF     VALUE OF 0.48 ug/mL FEU,     THIS ASSAY HAS BEEN DOCUMENTED     IN THE LITERATURE TO HAVE     A  SENSITIVITY AND NEGATIVE     PREDICTIVE VALUE OF AT LEAST     98 TO 99%.  THE TEST RESULT     SHOULD BE CORRELATED WITH     AN ASSESSMENT OF THE CLINICAL     PROBABILITY OF DVT / VTE.  GLUCOSE, CAPILLARY     Status: Abnormal   Collection Time    03/28/13 10:14 AM      Result Value Range   Glucose-Capillary 175 (*) 70 - 99 mg/dL  POCT I-STAT 3, BLOOD GAS (G3P V)     Status: Abnormal   Collection Time    03/28/13 12:12 PM      Result Value Range   pH, Ven 7.405 (*) 7.250 - 7.300   pCO2, Ven 33.2 (*) 45.0 - 50.0 mmHg   pO2, Ven 77.0 (*) 30.0 - 45.0 mmHg   Bicarbonate 20.8  20.0 - 24.0 mEq/L   TCO2 22  0 - 100 mmol/L   O2 Saturation 95.0     Acid-base deficit 3.0 (*) 0.0 - 2.0 mmol/L   Sample type VENOUS    CG4 I-STAT (LACTIC ACID)     Status: Abnormal   Collection Time    03/28/13 12:12 PM      Result Value Range   Lactic Acid, Venous 2.63 (*) 0.5 - 2.2 mmol/L  KETONES, QUALITATIVE     Status: None  Collection Time    03/28/13  1:00 PM      Result Value Range   Acetone, Bld NEGATIVE  NEGATIVE  BASIC METABOLIC PANEL     Status: Abnormal   Collection Time    03/28/13  1:42 PM      Result Value Range   Sodium 138  135 - 145 mEq/L   Potassium 3.7  3.5 - 5.1 mEq/L   Chloride 103  96 - 112 mEq/L   CO2 20  19 - 32 mEq/L   Glucose, Bld 42 (*) 70 - 99 mg/dL   Comment: CRITICAL RESULT CALLED TO, READ BACK BY AND VERIFIED WITH:     MCKOWAN,D RN 03/28/13 1435 WOOTEN,K   BUN 7  6 - 23 mg/dL   Creatinine, Ser 2.44 (*) 0.50 - 1.10 mg/dL   Calcium 8.5  8.4 - 01.0 mg/dL   GFR calc non Af Amer >90  >90 mL/min   GFR calc Af Amer >90  >90 mL/min   Comment:            The eGFR has been calculated     using the CKD EPI equation.     This calculation has not been     validated in all clinical     situations.     eGFR's persistently     <90 mL/min signify     possible Chronic Kidney Disease.  GLUCOSE, CAPILLARY     Status: Abnormal   Collection Time    03/28/13  1:55 PM      Result  Value Range   Glucose-Capillary 32 (*) 70 - 99 mg/dL  GLUCOSE, CAPILLARY     Status: Abnormal   Collection Time    03/28/13  2:26 PM      Result Value Range   Glucose-Capillary 51 (*) 70 - 99 mg/dL  GLUCOSE, CAPILLARY     Status: Abnormal   Collection Time    03/28/13  2:46 PM      Result Value Range   Glucose-Capillary 65 (*) 70 - 99 mg/dL  GLUCOSE, CAPILLARY     Status: Abnormal   Collection Time    03/28/13  3:11 PM      Result Value Range   Glucose-Capillary 114 (*) 70 - 99 mg/dL  GLUCOSE, CAPILLARY     Status: Abnormal   Collection Time    03/28/13  4:52 PM      Result Value Range   Glucose-Capillary 325 (*) 70 - 99 mg/dL  GLUCOSE, CAPILLARY     Status: Abnormal   Collection Time    04/20/13  9:20 AM      Result Value Range   Glucose-Capillary 101 (*) 70 - 99 mg/dL  POCT GLYCOSYLATED HEMOGLOBIN (HGB A1C)     Status: None   Collection Time    04/20/13  9:32 AM      Result Value Range   Hemoglobin A1C 8.0    MICROALBUMIN / CREATININE URINE RATIO     Status: None   Collection Time    04/20/13 10:00 AM      Result Value Range   Microalb, Ur 1.76  0.00 - 1.89 mg/dL   Creatinine, Urine 272.5     Microalb Creat Ratio 6.8  0.0 - 30.0 mg/g      Physical Exam: Constitutional:  BP 106/75  Pulse 78  Ht 5\' 4"  (1.626 m)  Wt 160 lb 9.6 oz (72.848 kg)  BMI 27.55 kg/m2  Musculoskeletal: Strength & Muscle Tone: within normal  limits Gait & Station: normal Patient leans: N/A  Mental Status Examination;  Patient is a young female who is well groomed and well dressed.  She appears to be in stated age.  She maintained good eye contact.  She describes her mood as anxious and her affect is constricted.  She denies any active or passive suicidal partial homicidal thought.  Her fund of knowledge is adequate.  There were no tremors or shakes.  She denies any hallucination, paranoia or any delusions.  There were no flight of ideas or any loose association.  Her thought processes is  logical and goal directed.  Her speech is clear and coherent.  She is alert and oriented x3.  Her insight judgment and impulse control is okay.   Medical Decision Making (Choose Three): Established Problem, Stable/Improving (1), Review of Psycho-Social Stressors (1), Review or order clinical lab tests (1), Decision to obtain old records (1), Established Problem, Worsening (2), Review of Medication Regimen & Side Effects (2) and Review of New Medication or Change in Dosage (2)  Assessment: Axis I: Maj. depressive disorder, recurrent  Axis II: Deferred  Axis III:  Past Medical History  Diagnosis Date  . Foot deformity, acquired     little toes, toes nail are falling off, needs surgery, but cant happen till AIC < 9  . Fatty liver   . Diabetic neuropathy 01/08/2012  . Depression 12/25/2011  . Diabetes type 1, uncontrolled 08/27/1997  . Hyperlipidemia LDL goal < 100 12/19/2011  . Superficial skin infection 12/18/2011    Recurrent for past 2 years   . Insomnia 01/30/2012    Axis IV: Mild to moderate   Plan:  I reviewed her psychosocial stressors, history, current medication.  I reviewed her blood work.  Patient continues to have some depressive symptoms and anxiety symptoms.  Currently she is taking Effexor 75 mg.  She is taking Ambien up to 10 mg for insomnia.  I recommend to try Effexor 150 mg daily.  Patient does not have any side effects of Effexor.  She started seeing therapist in this office.  I recommend to keep appointment of a [facility] for counseling.  Recommend to call us back if she is a question of any concern.  Followup in 3 weeks.  A new prescription of Effexor 150 mg given.Time spent 55 minutes.  More than 50% of the time spent in psychoeducation, counseling and coordination of care.  Discuss safety plan that anytime having active suicidal thoughts or homicidal thoughts then patient need to call 911 or go to the local emergency room.    Taleigha Pinson T.,  MD 06/10/2013

## 2013-06-24 ENCOUNTER — Ambulatory Visit (HOSPITAL_COMMUNITY): Payer: No Typology Code available for payment source | Admitting: Physician Assistant

## 2013-07-01 ENCOUNTER — Ambulatory Visit (INDEPENDENT_AMBULATORY_CARE_PROVIDER_SITE_OTHER): Payer: No Typology Code available for payment source | Admitting: Psychiatry

## 2013-07-01 ENCOUNTER — Encounter (HOSPITAL_COMMUNITY): Payer: Self-pay | Admitting: Psychiatry

## 2013-07-01 VITALS — BP 111/80 | HR 100 | Ht 64.0 in | Wt 156.6 lb

## 2013-07-01 DIAGNOSIS — F339 Major depressive disorder, recurrent, unspecified: Secondary | ICD-10-CM

## 2013-07-01 DIAGNOSIS — F329 Major depressive disorder, single episode, unspecified: Secondary | ICD-10-CM

## 2013-07-01 MED ORDER — VENLAFAXINE HCL ER 150 MG PO CP24: 150.0000 mg | ORAL_CAPSULE | Freq: Every day | ORAL | Status: DC

## 2013-07-01 NOTE — Progress Notes (Signed)
[facility] [id number] Progress Note  Christine Dunn [id number] 22 y.o.  07/01/2013 1:59 PM  Chief Complaint:  I am doing better with increase Effexor.  History of Present Illness:  Patient is a 22 year old Caucasian single female who came for her followup appointment.  She was seen first time on October 15.  Patient has a long history of depression which has been getting worse for the past 2 years.  We increased the Effexor to 150 mg.  She is doing much better.  She denies any recent self abusive behavior or scratching herself.  She is sleeping better.  She cut down her Ambien and takes only when she needed.  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  The patient denies any suicidal thoughts.  She endorses decreased energy and motivation to do things.  He denies any panic attack.  Her blood sugar is also much better.  She cut on her insulin because pressure that is well controlled.  Patient denies any psychosis, paranoia or any aggression.  She denies any panic attack.  She seen [name] for counseling.  Patient denies any tremors or shakes with increased Effexor.  Suicidal Ideation: No Plan Formed: No Patient has means to carry out plan: No  Homicidal Ideation: No Plan Formed: No Patient has means to carry out plan: No  Medical History; Patient has diabetes mellitus, neuropathy, hyperlipidemia.  Her primary care physician is [name] and she is seeing [name] for the management of diabetes at [facility].  Family History; Mother has depression.  Education and Work History; Patient is a Higher education careers adviser.  Psychosocial History; Patient was born and raised in [place].  Her parents are divorced.  She lives with her mother.  She has an older sister and a brother.  Patient is a relationship with her boyfriend was very supportive.  Patient has 4 children.  Legal History; Denies  History Of Abuse; Denies  Substance Abuse History; Denies  Past  Psychiatric History/Hospitalization(s) Patient denies any history of suicidal attempt, inpatient psychiatric treatment, mania, psychosis or any hallucination.  She was seen by [name] for depression a few years ago and prescribed Zoloft until 2 years ago it stopped working.  She endorses history of scratching herself causing injury to her skin. She denies any history of mood swing, aggression, violence or any impulsive behavior. In the past she has taken trazodone but did not work.  Anxiety: Yes Bipolar Disorder: No Depression: Yes Mania: No Psychosis: No Schizophrenia: No Personality Disorder: No Hospitalization for psychiatric illness: No History of Electroconvulsive Shock Therapy: No Prior Suicide Attempts: No   Review of Systems: Psychiatric: Agitation: No Hallucination: No Depressed Mood: No Insomnia: No Hypersomnia: No Altered Concentration: No Feels Worthless: No Grandiose Ideas: No Belief In Special Powers: No New/Increased Substance Abuse: No Compulsions: No  Neurologic: Headache: No Seizure: No Paresthesias: Yes    Outpatient Encounter Prescriptions as of 07/01/2013  Medication Sig  . esomeprazole (NEXIUM) 40 MG capsule Take 40 mg by mouth daily.   Marland Kitchen ibuprofen (ADVIL,MOTRIN) 200 MG tablet Take 400 mg by mouth once.  . insulin aspart (NOVOLOG) 100 UNIT/ML injection Inject 5 Units into the skin 3 (three) times daily before meals. Injects 10 units for breakfast  and lunch, 20-25 units for dinner, 10-15 units 1-2 times a day for snacks up to 100 total units per day. ICR is 1:3 for breakfast, 1:2 for lunch and 1: 3 for dinner.  . insulin glargine (  LANTUS) 100 UNIT/ML injection Inject 60 Units into the skin at bedtime.   Marland Kitchen neomycin-bacitracin-polymyxin (NEOSPORIN) ointment Apply 1 application topically at bedtime. Apply to affected areas on arms  . norgestrel-ethinyl estradiol (LO/OVRAL,CRYSELLE) 0.3-30 MG-MCG tablet Take 1 tablet by mouth daily.  . pregabalin  (LYRICA) 50 MG capsule Take 50 mg by mouth 2 (two) times daily.  Marland Kitchen venlafaxine XR (EFFEXOR XR) 150 MG 24 hr capsule Take 1 capsule (150 mg total) by mouth daily.  Marland Kitchen zolpidem (AMBIEN) 5 MG tablet Take 5 mg by mouth at bedtime as needed for sleep.  . [DISCONTINUED] venlafaxine XR (EFFEXOR XR) 75 MG 24 hr capsule Take 2 capsules (150 mg total) by mouth daily.  . [DISCONTINUED] traMADol (ULTRAM) 50 MG tablet Take 1 tablet (50 mg total) by mouth every 6 (six) hours as needed for pain.    Recent Results (from the past 2160 hour(s))  GLUCOSE, CAPILLARY     Status: Abnormal   Collection Time    04/20/13  9:20 AM      Result Value Range   Glucose-Capillary 101 (*) 70 - 99 mg/dL  POCT GLYCOSYLATED HEMOGLOBIN (HGB A1C)     Status: None   Collection Time    04/20/13  9:32 AM      Result Value Range   Hemoglobin A1C 8.0    MICROALBUMIN / CREATININE URINE RATIO     Status: None   Collection Time    04/20/13 10:00 AM      Result Value Range   Microalb, Ur 1.76  0.00 - 1.89 mg/dL   Creatinine, Urine 409.8     Microalb Creat Ratio 6.8  0.0 - 30.0 mg/g      Physical Exam: Constitutional:  BP 111/80  Pulse 100  Ht 5\' 4"  (1.626 m)  Wt 156 lb 9.6 oz (71.033 kg)  BMI 26.87 kg/m2  Musculoskeletal: Strength & Muscle Tone: within normal limits Gait & Station: normal Patient leans: N/A  Mental Status Examination;  Patient is a young female who is well groomed and well dressed. She appears to be in stated age. She maintained good eye contact. She describes her mood better and her affect is improved from the past.  She denies any active or passive suicidal partial homicidal thought. Her fund of knowledge is adequate. There were no tremors or shakes. She denies any hallucination, paranoia or any delusions. There were no flight of ideas or any loose association. Her thought processes is logical and goal directed. Her speech is clear and coherent. She is alert and oriented x3. Her insight judgment and  impulse control is okay.    Medical Decision Making (Choose Three): Established Problem, Stable/Improving (1), Review of Psycho-Social Stressors (1), Review or order clinical lab tests (1), Review and summation of old records (2), Review of Last Therapy Session (1) and Review of Medication Regimen & Side Effects (2)  Assessment: Axis I: Maj. depressive disorder, recurrent  Axis II: Deferred  Axis III:  Past Medical History  Diagnosis Date  . Foot deformity, acquired     little toes, toes nail are falling off, needs surgery, but cant happen till AIC < 9  . Fatty liver   . Diabetic neuropathy 01/08/2012  . Depression 12/25/2011  . Diabetes type 1, uncontrolled 08/27/1997  . Hyperlipidemia LDL goal < 100 12/19/2011  . Superficial skin infection 12/18/2011    Recurrent for past 2 years   . Insomnia 01/30/2012    Axis IV: Mild to moderate   Plan:  I review her symptoms, history and current medication.  Patient is doing better since we increased the Effexor 150 mg.  She does not having side effects.  She is using Ambien 5 mg as needed for insomnia.  She is nondrinking.  She is seeing [name] for counseling.  Recommend to call us back at his request and consent otherwise I will see her again in 2 months.Time spent 25 minutes.  More than 50% of the time spent in psychoeducation, counseling and coordination of care.  Discuss safety plan that anytime having active suicidal thoughts or homicidal thoughts then patient need to call 911 or go to the local emergency room.    Genie Mirabal T., MD 07/01/2013

## 2013-07-09 ENCOUNTER — Ambulatory Visit (INDEPENDENT_AMBULATORY_CARE_PROVIDER_SITE_OTHER): Payer: No Typology Code available for payment source | Admitting: Psychiatry

## 2013-07-09 DIAGNOSIS — F339 Major depressive disorder, recurrent, unspecified: Secondary | ICD-10-CM

## 2013-07-09 NOTE — Progress Notes (Signed)
   THERAPIST PROGRESS NOTE  Session Time: 3:30-4:20 pm  Participation Level: Active  Behavioral Response: CasualAlertDepressed  Type of Therapy: Individual Therapy  Treatment Goals addressed: Coping  Interventions: Solution Focused and Supportive  Summary: Christine Dunn is a 22 y.o. female who presents with mild depressed mood and affect. This is Pts second session with Clinical research associate. Pt said her depression symptoms are under control and her main concerns are related to "feeling lost" about her academic future. Pt described switching majors three times and still not feeling like she is in the right one. Pt said she has "never been good at making decisions". Pt described being happiest three years ago when she was best friends with a girl that planned everything for Pt. Pt said she "felt lost" when her friend moved to [place]. Pt appears to lack self-confidence in herself and struggle with making decisions. Pt struggled to identify what she does for fun currently and agreed to use the sessions to begin finding who she is.   Suicidal/Homicidal: Nowithout intent/plan  Therapist Response: Assessed overall level of functioning and explored goals for treatment.   Plan: Return again in 2 weeks.  Diagnosis: Axis I: Depression Recurrent Moderate    Axis II: No diagnosis    Adrienne Delay E, LCSW 07/09/2013

## 2013-07-17 ENCOUNTER — Ambulatory Visit (INDEPENDENT_AMBULATORY_CARE_PROVIDER_SITE_OTHER): Payer: No Typology Code available for payment source | Admitting: Psychiatry

## 2013-07-17 DIAGNOSIS — F339 Major depressive disorder, recurrent, unspecified: Secondary | ICD-10-CM

## 2013-07-17 NOTE — Progress Notes (Signed)
THERAPIST PROGRESS NOTE  Session Time: 1:30-2:20 pm  Participation Level: Active  Behavioral Response: Casual, Neat and Well GroomedAlertEuthymic  Type of Therapy: Individual Therapy  Treatment Goals addressed: Coping  Interventions: Other: Created Treatment Plan Goals  Summary: Christine Dunn is a 22 y.o. female who presents with euthymic mood, but reports high stress. Used entire session to create treatment plan goals - see below.       INDIVIDUAL TREATMENT PLAN       Date of Admission:  06/09/2013 Date of Treatment Plan: 07/17/2013 Service: [x]  Group  [x]  Individual [x]  Comprehensive []  Revised due to: []  Change in Diagnosis     []  Change in Service     []  Expiration of Previous Treatment Plan Diagnosis(es): Depression Recurrent Moderate  Recommended Treatment:  [x]  Chemical Dependency (CD-IOP) group therapy 3x weekly and 1:1 individual therapy as required  []  Mental Health (MH-IOP) group therapy 5x weekly and 1:1 individual therapy as required  [x]  Individual Therapy  []  Family Therapy  [x]  Couples Therapy  [] Other:           Possible Negative Outcomes of Treatment: Symptoms of mental illness may increase and changes in relationship can occur during the course of treatment.  Client's Strengths (What are the strengths and resources that will help clients move towards their goals):  Motivated, Determined, Smart, Compassionate, Resilient   Personal Recovery Goal(s)/Client's Goals for Treatment (use client's words):  To have a better relationship with mom, determine college major and better manage stress.      Objective Behavioral Criteria for Discharge: Client will maintain stability in the community as demonstrated by the following:   No suicidal behaviors for 3 months.   No hospitalizations or emergency room visits for psychological reasons for 3 months.   No SIB behaviors requiring medical attention for 3 months.   Emotional regulation by reporting  distress at an average of 5/10 or below for 3 months.   Demonstration of healthy community supports by initiating peer contact at least twice weekly separate from the treatment environment.   Agreed-upon transition plan to a less restrictive setting.  INFORMED CONSENT TO TREATMENT. I acknowledge that I have been informed of and am able to understand my diagnosis, the nature and purpose of the proposed treatment, the risks and benefits of the proposed treatment, the possible negative outcomes of and possible alternatives to the proposed treatment, the probability that the proposed treatment will be successful, and the prognosis if I choose not to receive the treatment. Further, I have been informed of the extent and limits of confidentiality of treatment information. I understand the risks, benefits, possible negative outcomes of and alternatives to this proposed treatment, and my signature below verifies that I have actively participated in the development of my treatment plan and that I am willingly and voluntarily agreeing to the treatment outlined in this plan. Assessed Needs (Problem) Client's Goals (what client will do)   Interventions (what staff will do)   1. School   o Have more patience with the process of going to school o Enjoy taking classes o Determine, over time, school major   o Teach CBT to challenge negative self-talk and challenge high expectations of self. o Provide resources to help Pt work with her school to determine major. o Use problem solution skills to help Pt explore career interests. o Encourage patience through Pts academic process.     2. Stress    o Improve ability to manage anxiety symptoms and better  handle stress.  o Identify causes for anxiety and explore ways to lower it  o Resolve the core conflict that is the cause of the anxiety.  o Manage thoughts and worrisome thinking that contribute to feelings of anxiety.    o Provide education about anxiety to  help patient identify causes, symptoms and triggers. o Facilitate problem solution skills to help patient identify and implement options to resolve stressors.  o Teach coping skills to manage anxiety symptoms such as; biofeedback, guided imagery, meditation and aromatherapy. o Use CBT to identify and change anxiety provoking thought patterns.   3. Tension with Mom  o Have better communication and less tension with mother.   o Explore causes of tension in the mother daughter relationship o Explore possible resentments towards mom. o Educate on and model effective communication. o Challenge Pts beliefs that she is not independent if she needs the support of her mother.    I have [] received [] declined a copy of this treatment plan.  Client: Date: Guardian/Parent: Date:   Therapist: Date: Licensed Provider (if applicable): Date:    Six month review:   I have reviewed this treatment plan and consider it still valid. Client: Date: Guardian/Parent: Date:      Suicidal/Homicidal: Nowithout intent/plan  Therapist Response: Assessed overall level of functioning and created treatment plan goals.   Plan: Return again in 1 weeks.  Diagnosis: Axis I: Depression Recurrent Moderate    Axis II: No diagnosis    Carman Ching, LCSW 07/17/2013

## 2013-07-22 ENCOUNTER — Ambulatory Visit (INDEPENDENT_AMBULATORY_CARE_PROVIDER_SITE_OTHER): Payer: No Typology Code available for payment source | Admitting: Psychiatry

## 2013-07-22 DIAGNOSIS — F339 Major depressive disorder, recurrent, unspecified: Secondary | ICD-10-CM

## 2013-07-22 NOTE — Progress Notes (Signed)
   THERAPIST PROGRESS NOTE  Session Time: 2:00-2:50 pm  Participation Level: Active  Behavioral Response: Neat and Well GroomedAlertEuthymic  Type of Therapy: Individual Therapy  Treatment Goals addressed: Coping  Interventions: CBT, Solution Focused and Supportive  Summary: Christine Dunn is a 22 y.o. female who presents with euthymic mood and affect. Pt reports "doing ok" and talked about being alone on Thanksgiving so she is going to celebrate with her boyfriend. Pt said her mother is celebrating the holiday at her boyfriends house. Pt reviewed treatment plan goals and talked about her goals to decide her college major and start moving towards a more postive direction in her life. Pt said overall mood is stable and she said her next appointment is not until January and Pt said that is fine to have it out that far due to how stable she is doing.    Suicidal/Homicidal: Nowithout intent/plan  Therapist Response: Assessed overall level of functioning and signed treatment plan.   Plan: Return again in 6 weeks.  Diagnosis: Axis I: Depression Major Recurrent    Axis II: No diagnosis    Carman Ching, LCSW 07/22/2013

## 2013-07-22 NOTE — Progress Notes (Deleted)
    Daily Group Progress Note  Program: {[facility]L AMB BH IOP/CDIOP Program Type:21022744}  Group Time:   Participation Level: {CHL AMB BH Group Participation:21022742}  Behavioral Response: {CHL AMB BH Group Behavior:21022743}  Type of Therapy:  {CHL AMB BH Type of Therapy:21022741}  Summary of Progress: ***     Group Time:   Participation Level:  {CHL AMB BH Group Participation:21022742}  Behavioral Response: {CHL AMB BH Group Behavior:21022743}  Type of Therapy: {CHL AMB BH Type of Therapy:21022741}  Summary of Progress: ***  Dequincy Born E, LCSW 

## 2013-08-24 ENCOUNTER — Ambulatory Visit: Payer: Self-pay

## 2013-08-27 HISTORY — PX: WISDOM TOOTH EXTRACTION: SHX21

## 2013-08-31 ENCOUNTER — Telehealth (HOSPITAL_COMMUNITY): Payer: Self-pay | Admitting: *Deleted

## 2013-08-31 ENCOUNTER — Ambulatory Visit (INDEPENDENT_AMBULATORY_CARE_PROVIDER_SITE_OTHER): Payer: Self-pay | Admitting: Psychiatry

## 2013-08-31 ENCOUNTER — Encounter (HOSPITAL_COMMUNITY): Payer: Self-pay | Admitting: Psychiatry

## 2013-08-31 VITALS — BP 108/72 | HR 111 | Ht 62.99 in | Wt 159.8 lb

## 2013-08-31 DIAGNOSIS — F329 Major depressive disorder, single episode, unspecified: Secondary | ICD-10-CM

## 2013-08-31 DIAGNOSIS — F339 Major depressive disorder, recurrent, unspecified: Secondary | ICD-10-CM

## 2013-08-31 MED ORDER — VENLAFAXINE HCL ER 150 MG PO CP24: 150.0000 mg | ORAL_CAPSULE | Freq: Every day | ORAL | Status: DC

## 2013-08-31 NOTE — Telephone Encounter (Signed)
Prescription to be filled at appointment 08/31/13.

## 2013-08-31 NOTE — Progress Notes (Signed)
[facility] [address] Progress Note  Christine Dunn [id number] 23 y.o.  08/31/2013 1:39 PM  Chief Complaint:  Medication management and followup.  History of Present Illness:  [facility] came for her followup appointment .  She is compliant with Effexor 150 mg daily.  She denies any side effects.  She had a good Christmas.  She denies any crying spells.  She is sleeping good.  She has not involved in any self abuse behavior.  She do not recall when was the last time she scratched herself.  She endorsed less anxiety and nervousness.  She seen [facility] for counseling.  She denies any recent panic attack.  She is going to [facility] regularly.  She has cut down her Ambien and only takes half tablet when she needed.  She's not drinking or using any illegal substances.  She's also compliant with her insulin.  She sees an endocrinologist at [facility].  She is not drinking or using any illegal substances.  Suicidal Ideation: No Plan Formed: No Patient has means to carry out plan: No  Homicidal Ideation: No Plan Formed: No Patient has means to carry out plan: No  Medical History; Patient has diabetes mellitus, neuropathy, hyperlipidemia.  Her primary care physician is [facility] and she is seeing [facility] for the management of diabetes at [facility].  Psychosocial History; Patient was born and raised in [place].  Her parents are divorced.  She lives with her mother.  She has an older sister and a brother.  Patient has a good relationship with her boyfriend was very supportive.  Patient has no children.  Past Psychiatric History/Hospitalization(s) Patient denies any history of suicidal attempt, inpatient psychiatric treatment, mania, psychosis or any hallucination.  She was seen by [facility] for depression a few years ago and prescribed Zoloft until 2 years ago it stopped working.  She endorses history of scratching herself causing injury to her skin. She denies any history of mood swing,  aggression, violence or any impulsive behavior. In the past she has taken trazodone but did not work. Anxiety: No Bipolar Disorder: No Depression: No Mania: No Psychosis: No Schizophrenia: No Personality Disorder: No Hospitalization for psychiatric illness: No History of Electroconvulsive Shock Therapy: No Prior Suicide Attempts: No   Review of Systems: Psychiatric: Agitation: No Hallucination: No Depressed Mood: No Insomnia: No Hypersomnia: No Altered Concentration: No Feels Worthless: No Grandiose Ideas: No Belief In Special Powers: No New/Increased Substance Abuse: No Compulsions: No  Neurologic: Headache: No Seizure: No Paresthesias: Yes    Outpatient Encounter Prescriptions as of 08/31/2013  Medication Sig  . esomeprazole (NEXIUM) 40 MG capsule Take 40 mg by mouth daily.   Marland Kitchen ibuprofen (ADVIL,MOTRIN) 200 MG tablet Take 400 mg by mouth once.  . insulin aspart (NOVOLOG) 100 UNIT/ML injection Inject 5 Units into the skin 3 (three) times daily before meals. Injects 10 units for breakfast  and lunch, 20-25 units for dinner, 10-15 units 1-2 times a day for snacks up to 100 total units per day. ICR is 1:3 for breakfast, 1:2 for lunch and 1: 3 for dinner.  . insulin glargine (LANTUS) 100 UNIT/ML injection Inject 60 Units into the skin at bedtime.   . norgestrel-ethinyl estradiol (LO/OVRAL,CRYSELLE) 0.3-30 MG-MCG tablet Take 1 tablet by mouth daily.  . pregabalin (LYRICA) 50 MG capsule Take 50 mg by mouth 2 (two) times daily.  Marland Kitchen venlafaxine XR (EFFEXOR-XR) 150 MG 24 hr capsule Take 1 capsule (150 mg total) by mouth daily.  Marland Kitchen zolpidem (AMBIEN)  5 MG tablet Take 5 mg by mouth at bedtime as needed for sleep.  . [DISCONTINUED] venlafaxine XR (EFFEXOR XR) 150 MG 24 hr capsule Take 1 capsule (150 mg total) by mouth daily.  Marland Kitchen. neomycin-bacitracin-polymyxin (NEOSPORIN) ointment Apply 1 application topically at bedtime. Apply to affected areas on arms    No results found for this or any  previous visit (from the past 2160 hour(s)).    Physical Exam: Constitutional:  BP 108/72  Pulse 111  Ht 5' 2.99" (1.6 m)  Wt 159 lb 12.8 oz (72.485 kg)  BMI 28.31 kg/m2  Musculoskeletal: Strength & Muscle Tone: within normal limits Gait & Station: normal Patient leans: N/A  Mental Status Examination;  Patient is a young female who is well groomed and well dressed. She appears to be in stated age. She maintained good eye contact. She describes her mood good and her affect is mood appropriate.  She denies any active or passive suicidal partial homicidal thought. Her fund of knowledge is adequate. There were no tremors or shakes. She denies any hallucination, paranoia or any delusions. There were no flight of ideas or any loose association. Her thought processes is logical and goal directed. Her speech is clear and coherent. She is alert and oriented x3. Her insight judgment and impulse control is okay.    Medical Decision Making (Choose Three): Established Problem, Stable/Improving (1), Review and summation of old records (2), Order AIMS Test (2), Review of Last Therapy Session (1) and Review of Medication Regimen & Side Effects (2)  Assessment: Axis I: Maj. depressive disorder, recurrent  Axis II: Deferred  Axis III:  Past Medical History  Diagnosis Date  . Foot deformity, acquired     little toes, toes nail are falling off, needs surgery, but cant happen till AIC < 9  . Fatty liver   . Diabetic neuropathy 01/08/2012  . Depression 12/25/2011  . Diabetes type 1, uncontrolled 08/27/1997  . Hyperlipidemia LDL goal < 100 12/19/2011  . Superficial skin infection 12/18/2011    Recurrent for past 2 years   . Insomnia 01/30/2012    Axis IV: Mild to moderate   Plan:  Patient is doing better on Effexor 150 mg daily.  Recommend to continue current dose.  Patient does not have any side effects.  She seemed [facility] for counseling.  Followup in 3 months.  She is using Ambien 5 mg as needed  for insomnia.  Recommended to call us back if she has any question or any concern.   Ayahna Solazzo T., MD 08/31/2013

## 2013-09-01 ENCOUNTER — Ambulatory Visit (INDEPENDENT_AMBULATORY_CARE_PROVIDER_SITE_OTHER): Payer: Self-pay | Admitting: Psychiatry

## 2013-09-01 DIAGNOSIS — F339 Major depressive disorder, recurrent, unspecified: Secondary | ICD-10-CM

## 2013-09-01 NOTE — Progress Notes (Signed)
   THERAPIST PROGRESS NOTE  Session Time: 3:30-4:20 pm  Participation Level: Active  Behavioral Response: Neat and Well GroomedAlertstressed  Type of Therapy: Individual Therapy  Treatment Goals addressed: Diagnosis: depression  Interventions: CBT and Solution Focused  Summary: Christine Dunn is a 23 y.o. female who presents with stable mood and affect, but reports some stress associated with starting a new semester of school. Pt said she failed several classes last semester and lost her financial aid causing her mother to have to help Pt pay for this semester. Pt said she continues to struggle with deciding on her major but described plans to meet with her school to help her determine next steps. Pt said her relationship with her boyfriend is also a stressor and she uncertain if she wants to stay with him but fears leaving the relationship and being alone.   Suicidal/Homicidal: Nowithout intent/plan  Therapist Response: Assessed overall level of functioning per Pt self report and explored current stressors with school and relationship issues.   Plan: Return again in 1 weeks.  Diagnosis: Axis I: Depression major recurrent    Axis II: No diagnosis    Richie Vadala E, LCSW 09/01/2013

## 2013-09-08 ENCOUNTER — Ambulatory Visit (INDEPENDENT_AMBULATORY_CARE_PROVIDER_SITE_OTHER): Payer: No Typology Code available for payment source | Admitting: Psychiatry

## 2013-09-08 DIAGNOSIS — F339 Major depressive disorder, recurrent, unspecified: Secondary | ICD-10-CM

## 2013-09-08 NOTE — Progress Notes (Signed)
   THERAPIST PROGRESS NOTE  Session Time: 2:30-3:20 pm  Participation Level: Active  Behavioral Response: CasualAlertEuthymic  Type of Therapy: Individual Therapy  Treatment Goals addressed: School, Stress and tension with mom  Interventions: Motivational Interviewing and Solution Focused  Summary: Christine [facility] is a 23 y.o. female who presents with euthymic mood and affect. Pt was quiet and less talkative. Pt reports stable mood with minimal stressors. Her primary concerns were difficulty with sleep and reduce appitite, which Pt could not attribute to any one cause. Pt said her first week of college classes went well an she is feeling "calm" about this semester. Pt said her relationship with her boyfriend is "fine" and she said she has not talked with him since last session, which she described feeling fine about. Pt said she feels an 8/10 on her level of functioning scale with 10 being high. Pt said she would feel good staying at this level because everything feels "in balance in her life". Pt said it would take one area of her life being extremely good for her to get to a 10 and rarely has that happened in her life. Pt appears to be at a baseline level of functioning. Pt said she would like to temporarily keep sessions weekly to ensure stable mood through her school transition.    Suicidal/Homicidal: Nowithout intent/plan  Therapist Response: Assessed overall level of functioning per Pt self report, Explored progress in treatment and current stressors and how they are impacting overall wellness, discussed a need for therapy weekly and assessed to continue temporarily to ensure continued progress.   Plan: Return again in 1 weeks.  Diagnosis: Axis I: Depression Major Recurrent    Axis II: No diagnosis    Carman ChingGLEHORN,Bebe Moncure E, LCSW 09/08/2013

## 2013-09-15 ENCOUNTER — Ambulatory Visit (INDEPENDENT_AMBULATORY_CARE_PROVIDER_SITE_OTHER): Payer: No Typology Code available for payment source | Admitting: Psychiatry

## 2013-09-15 DIAGNOSIS — F339 Major depressive disorder, recurrent, unspecified: Secondary | ICD-10-CM

## 2013-09-15 NOTE — Progress Notes (Signed)
   THERAPIST PROGRESS NOTE  Session Time: 2;30-3:20 pm  Participation Level: Active  Behavioral Response: CasualAlertEuthymic  Type of Therapy: Individual Therapy  Treatment Goals addressed: School and Stress  Interventions: Supportive and Family Systems  Summary: Christine [facility] is a 23 y.o. female who presents with elevated mood and affect and reports feeling "good". Pt shared that her main concern is on her relationship with her boyfriend and the lack of communication they have had that is causing some level of stress on her. Pt said They did not have any form of formal separation, but they have stopped contacting each other. Pt said she changed her relationship status on her Facebook page and was surprised that her boyfriend did not contact her to discuss it. Pt said she is not "devatstaed or sad, but is disappointed". Pt explored how past relationships with her father, which is a strained relationship, and her mother also contribute to how she holds back and does not fully trust in current relationships. Pt said she was not aware that events from her past had such an affect on the relationships she has today. Pt said school is going well in her second week of classes and she feels so much less stress than she did last semester where she was overloaded with too much academically. Pt said she wants to move to [place] to continue Art therapistseeing writer for therapy. [facility] and then Pt will resume appointments in [place].   Suicidal/Homicidal: Nowithout intent/plan  Therapist Response: Assessed overall level of functioning per Pt self report. Worked on goals of stress by exploring how she is managing the separation from her boyfriend and looking at this as a trigger and worked on the goal of school by assessing her functioning and expectations for this semester.   Plan: Return again in 1 week in the [place] office and then resume weekly sessions in  [place] with Clinical research associatewriter.   Diagnosis: Axis I: Depression Major Recurrent    Axis II: No diagnosis    Christine Derenzo E, LCSW 09/15/2013

## 2013-09-22 ENCOUNTER — Ambulatory Visit (INDEPENDENT_AMBULATORY_CARE_PROVIDER_SITE_OTHER): Payer: No Typology Code available for payment source | Admitting: Psychiatry

## 2013-09-22 DIAGNOSIS — F339 Major depressive disorder, recurrent, unspecified: Secondary | ICD-10-CM

## 2013-09-22 NOTE — Progress Notes (Signed)
   THERAPIST PROGRESS NOTE  Session Time: 2:30-3:20 pm  Participation Level: Active  Behavioral Response: Neat and Well GroomedAlertAnxious  Type of Therapy: Individual Therapy  Treatment Goals addressed: Stress  Interventions: CBT and Solution Focused  Summary: [facility] is a 23 y.o. female who presents with high anxiety associated with a stressful event that occurred this past week with her ex-boyfriend. Pt said her ex-boyfriend that she dated before this past boyfriend contacted her and said he regrets breaking up with her and wants her back. Pt said he is now married with a one month old son with his wife. Pt said she dated him for five years and he left her to be with his wife. Pt said her heart was broken but she still loves him and has feelings for him. Pt said she was contacted by his wife after she learned he had contacted Pt. Pt expressed feelings of confusion about wanting to be with him again, but knowing that would probably be a mistake. Pt used he session to explore both sides and try to find a solution. Pt agreed to make a pro and con list of resuming a relationship with him for discussion in the next session since she said the confusion is causing her high stress. Pt said she continues to maintain her focus on her college classes despite the stress with this situation. Pt said she is scheduled to see writer in [place] for her next appointment next week and she feels comfortable with directions.    Suicidal/Homicidal: Nowithout intent/plan  Therapist Response: Assessed overall level of functioning per Pt self report. Worked on goal of stress by exploring current stressor and engaging problem solution skills to help Pt resolve it. Assigned a homework assignment to make a pro and con list pertaining to her ex-boyfriend for discussion in next session. Educated Pt on directions to new facility.   Plan: Return again in 1 weeks. Review homework assignment and follow up with  conflict with ex-boyfriend and on school progress.   Diagnosis: Axis I: Depression major recurrent    Axis II: No diagnosis    Argie Lober E, LCSW 09/22/2013

## 2013-09-29 ENCOUNTER — Ambulatory Visit (HOSPITAL_COMMUNITY): Payer: Self-pay | Admitting: Psychiatry

## 2013-09-29 ENCOUNTER — Ambulatory Visit (INDEPENDENT_AMBULATORY_CARE_PROVIDER_SITE_OTHER): Payer: No Typology Code available for payment source | Admitting: Psychiatry

## 2013-09-29 DIAGNOSIS — F339 Major depressive disorder, recurrent, unspecified: Secondary | ICD-10-CM

## 2013-09-29 NOTE — Progress Notes (Signed)
   THERAPIST PROGRESS NOTE  Session Time: 2:00-2:50 pm  Participation Level: Active  Behavioral Response: Casual, Neat and Well GroomedAlertEuthymic  Type of Therapy: Individual Therapy  Treatment Goals addressed: School and Stress  Interventions: CBT and Solution Focused  Summary: [facility] is a 23 y.o. female who presents with euthymic mood and affect but reports high stress associated with the uncertainty of her relationship with her ex-boyfreind. Pt said since he contacted her and expressed an interest in resuming the relationship, she has been thinking about it constantly. Pt said she completed her pro and con list that was assigned last session to help her explore if she wanted to resume a relationship with him, given their past history of him leaving Pt for another woman. Pt said the pro side outweighed the con side and she would like to resume the relationship. Pt said she texted him since last session and requested they meet in person to talk,but she has not hear back . Pt is unsure if she wants to pursue further contact or wait for him to contact her. Pt said she is spending most of her time thinking about the situation and said she does not have any activities or things she does for herself. She tends to throw herself fully into relationships with others, both friendships and romantic relationships and get completely involved with them. Pt is aware that she gets hurt if something happens to the relationship, but does not know who she is separate from these connections. Pt is aware she is more of a follower and takes on this role with others. Pt agreed to spend time this week thinking about hobbies and activities she enjoys to start building up what she does for herself apart from her relationships with other people. Pt denied any active thoughts of suicide and her mood is stable.   Suicidal/Homicidal:No  Therapist Response: Assessed overall level of functioning per Pt self report.  Discussed current status of her relationship with her ex-boyfriend and conflict she is having with that, explored Pt tendency to get overly involved in relationships with others and looked into hobbies and activities that Pt can get involved in that can help balance herself more in relationship so she does not become overly connected at an unhealthy level.   Plan: Return again in 1 weeks. Pt will continue weekly therapy with Clinical research associatewriter in the [place] office.   Diagnosis: Axis I: Depression major recurrent    Axis II: No diagnosis    Hadi Dubin E, LCSW 09/29/2013

## 2013-10-06 ENCOUNTER — Ambulatory Visit (INDEPENDENT_AMBULATORY_CARE_PROVIDER_SITE_OTHER): Payer: No Typology Code available for payment source | Admitting: Psychiatry

## 2013-10-06 ENCOUNTER — Ambulatory Visit (HOSPITAL_COMMUNITY): Payer: Self-pay | Admitting: Psychiatry

## 2013-10-06 ENCOUNTER — Encounter (INDEPENDENT_AMBULATORY_CARE_PROVIDER_SITE_OTHER): Payer: Self-pay

## 2013-10-06 DIAGNOSIS — F339 Major depressive disorder, recurrent, unspecified: Secondary | ICD-10-CM
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
## 2013-10-08 ENCOUNTER — Encounter: Payer: Self-pay | Admitting: Internal Medicine

## 2013-10-13 ENCOUNTER — Ambulatory Visit (HOSPITAL_COMMUNITY): Payer: Self-pay | Admitting: Psychiatry

## 2013-10-15 ENCOUNTER — Encounter: Payer: Self-pay | Admitting: Internal Medicine

## 2013-10-15 ENCOUNTER — Ambulatory Visit (INDEPENDENT_AMBULATORY_CARE_PROVIDER_SITE_OTHER): Payer: No Typology Code available for payment source | Admitting: Internal Medicine

## 2013-10-15 VITALS — BP 125/88 | HR 69 | Temp 98.8°F | Ht 64.0 in | Wt 158.0 lb

## 2013-10-15 DIAGNOSIS — E1065 Type 1 diabetes mellitus with hyperglycemia: Secondary | ICD-10-CM

## 2013-10-15 DIAGNOSIS — S8990XA Unspecified injury of unspecified lower leg, initial encounter: Secondary | ICD-10-CM

## 2013-10-15 DIAGNOSIS — L089 Local infection of the skin and subcutaneous tissue, unspecified: Secondary | ICD-10-CM

## 2013-10-15 DIAGNOSIS — S99919A Unspecified injury of unspecified ankle, initial encounter: Secondary | ICD-10-CM

## 2013-10-15 DIAGNOSIS — E11319 Type 2 diabetes mellitus with unspecified diabetic retinopathy without macular edema: Secondary | ICD-10-CM

## 2013-10-15 DIAGNOSIS — E785 Hyperlipidemia, unspecified: Secondary | ICD-10-CM

## 2013-10-15 DIAGNOSIS — S99929A Unspecified injury of unspecified foot, initial encounter: Secondary | ICD-10-CM

## 2013-10-15 DIAGNOSIS — S8991XA Unspecified injury of right lower leg, initial encounter: Secondary | ICD-10-CM

## 2013-10-15 DIAGNOSIS — IMO0002 Reserved for concepts with insufficient information to code with codable children: Secondary | ICD-10-CM

## 2013-10-15 DIAGNOSIS — E162 Hypoglycemia, unspecified: Secondary | ICD-10-CM

## 2013-10-15 DIAGNOSIS — E1039 Type 1 diabetes mellitus with other diabetic ophthalmic complication: Secondary | ICD-10-CM

## 2013-10-15 LAB — GLUCOSE, CAPILLARY
GLUCOSE-CAPILLARY: 72 mg/dL (ref 70–99)
Glucose-Capillary: 51 mg/dL — ABNORMAL LOW (ref 70–99)
Glucose-Capillary: 66 mg/dL — ABNORMAL LOW (ref 70–99)
Glucose-Capillary: 119 mg/dL — ABNORMAL HIGH (ref 70–99)
Glucose-Capillary: 151 mg/dL — ABNORMAL HIGH (ref 70–99)

## 2013-10-15 LAB — POCT GLYCOSYLATED HEMOGLOBIN (HGB A1C): Hemoglobin A1C: 7.9

## 2013-10-15 NOTE — Assessment & Plan Note (Addendum)
Assessment: Patient is here for routine office follow up and is noted to be hypoglycemic. Her CBG is 51 upon arrival to the clinic with accompanied shaking feeling.  No other associated symptoms. She states that she is taking reduced dose of Lantus of 50 units by her endocrinologist Dr. Corwin LevinsWilliam Smith.  And she did not eat much lunch today.  She further reports averagely two hypoglycemic episodes monthly mainly happening after dinner times if she did not eat much or skip the meals. Denies hypoglycemia at mid night time.   Plan - OJ and crackers - CBG > 100 x 2 - patient is instructed to eat balanced meals and avoid skipping lunch. She understands and agrees with the plan.

## 2013-10-15 NOTE — Assessment & Plan Note (Addendum)
Lab Results  Component Value Date   HGBA1C 7.9 10/15/2013   HGBA1C 8.0 04/20/2013   HGBA1C 9.0 11/20/2012     Assessment: Diabetes control: fair control Progress toward A1C goal:  improved              Patient states that she is doing fine, and reports that she has been followed by her endocrinologist Dr. Corwin LevinsWilliam Smith at [facility]. Her last OV was on 09/24/13.  She states that she takes reduced dose of Lantus 50 units ( was on 60 units last ov in August 2014), and Novolog SSI, both which are prescribed by Dr. Katrinka BlazingSmith.  She reports averagely 2 episode of hypoglycemias monthly with lowest CBG of 50s, which usually happens in the evening when she skips dinners.   Plan: Medications:  continue current medications Home glucose monitoring: Frequency:   QID Timing:  AC +HS Instruction/counseling given: reminded to get eye exam, reminded to bring blood glucose meter & log to each visit, reminded to bring medications to each visit, discussed foot care, discussed the need for weight loss, discussed diet and discussed sick day management Educational resources provided:   Self management tools provided:   Other plans 1. continue to followup with her endocrinologist Dr. Katrinka BlazingSmith 2. continue current insulin regimen per Dr. Katrinka BlazingSmith. 3. patient is instructed to eat balanced meals and avoid skipping meals to avoid hypoglycemic episodes during the day 4. she understands and agrees with the plan

## 2013-10-15 NOTE — Assessment & Plan Note (Signed)
She reports that she is no longer on any statin and her FLP was just checked by her endocrinologist Dr. Katrinka BlazingSmith.. she states that Dr. Katrinka BlazingSmith would like to manage her hyperlipidemia.  Plan - Will obtain office records from her endocrinologist Dr. Katrinka BlazingSmith including lipid panel.

## 2013-10-15 NOTE — Patient Instructions (Signed)
  1. Will check your Retina exam next week--please schedule for patient to have it.  2  Will give you Influenza vaccine today  3. Will obtain medical records from Dr. Corwin LevinsWilliam Smith Endocrinologist at [facility] ( FLP and BMP)  4.will obtain medical records of right knee pain from her orthopedic surgeon.   5. Will obtain medical records from your dermatologist  6. Follow up in 3 months

## 2013-10-15 NOTE — Progress Notes (Signed)
Patient ID: Christine Dunn, female   DOB: 11-18-1990, 23 y.o.   MRN: [id number]    Patient: Christine Dunn   MRN: [id number]  DOB: [birth year]  PCP: Dede Query, MD   Subjective:    CC: Diabetes   HPI: [name] is a 23 y.o. woman with past medical history significant for type 1 diabetes, superficial skin lesions, hyperlipidemia, depression and medical noncompliance who presents today for follow up on her DM.  She is noncompliant with the clinic follow ups. The last OV with me was in March 2014. She was last seen by our clinic in August 2014 for an acute visit of right knee pain   1) T1DM Patient states that she has been seeing her Endocrinologist Dr. Corwin Levins for DM management at [facility] through a charity care organization. Her last visit was on 09/24/13 and had some lab work done including Lipid panel, metabolic panel. She states that [name] would manage her hyperlipidemia. She is on Lantus 50 units QHS and Novolog SSI by [name]. She states that she is doing well. Reports hypoglycemic events averagely twice monthly with the lowest CBG of 50's. She states that her hypoglycemic events usually happens at 10-11 pm at night. She is noted to have hypoglycemia with CBG of 51 today. Accompanied by feeling shaking.   2) chronic skin infection and depression She has been referred to a Dermatologist at [facility] for her chronic skin infection, and her last OV was in Nov. 2014. She has been followed with her Psychiatrist at [facility] and she is on effexor.    Health maintenance - A1C, Foot exam - Retina exam - Influenza vaccine--done today.  - will obtain medical records from Dr. Corwin Levins Endocrinologist at [facility]    She reports that she had lab work at his endocrinologist office including metabolic panel, lipid panel in 09/24/13.  - will obtain medical records of right knee pain from her orthopedic surgeon.   Review of Systems: Per HPI.   Current Outpatient Medications: Current  Outpatient Prescriptions  Medication Sig Dispense Refill  . esomeprazole (NEXIUM) 40 MG capsule Take 40 mg by mouth daily.       Marland Kitchen ibuprofen (ADVIL,MOTRIN) 200 MG tablet Take 400 mg by mouth once.      . insulin aspart (NOVOLOG) 100 UNIT/ML injection Inject 5 Units into the skin 3 (three) times daily before meals. Novolog TID. The goal of CBG is 150. Patient is to take additional 1 unit with every 50 of cbgs above 150. She is to take additional 1 unit for every 3 CHO per patient report( sees endocrinologist Dr.Smith).      . insulin glargine (LANTUS) 100 UNIT/ML injection Inject 50 Units into the skin at bedtime.       Marland Kitchen neomycin-bacitracin-polymyxin (NEOSPORIN) ointment Apply 1 application topically at bedtime. Apply to affected areas on arms      . norgestrel-ethinyl estradiol (LO/OVRAL,CRYSELLE) 0.3-30 MG-MCG tablet Take 1 tablet by mouth daily.      . pregabalin (LYRICA) 50 MG capsule Take 50 mg by mouth 2 (two) times daily.      Marland Kitchen venlafaxine XR (EFFEXOR-XR) 150 MG 24 hr capsule Take 1 capsule (150 mg total) by mouth daily.  30 capsule  2  . zolpidem (AMBIEN) 5 MG tablet Take 5 mg by mouth at bedtime as needed for sleep.       No current facility-administered medications for this visit.    Allergies: Allergies  Allergen Reactions  .  Gabapentin (Phn) Nausea Only    Dizziness   . Penicillins Hives and Swelling    Past Medical History  Diagnosis Date  . Foot deformity, acquired     little toes, toes nail are falling off, needs surgery, but cant happen till AIC < 9  . Fatty liver   . Diabetic neuropathy 01/08/2012  . Depression 12/25/2011  . Diabetes type 1, uncontrolled 08/27/1997  . Hyperlipidemia LDL goal < 100 12/19/2011  . Superficial skin infection 12/18/2011    Recurrent for past 2 years   . Insomnia 01/30/2012    Objective:    Physical Exam: Filed Vitals:   10/15/13 1354  BP: 125/88  Pulse: 69  Temp: 98.8 F (37.1 C)  TempSrc: Oral  Height: 5\' 4"  (1.626 m)  Weight:  158 lb (71.668 kg)  SpO2: 98%     General: Vital signs reviewed and noted. Well-developed, well-nourished, in no acute distress; alert, appropriate and cooperative throughout examination.  Head: Normocephalic, atraumatic.  Lungs:  Normal respiratory effort. Clear to auscultation BL without crackles or wheezes.  Heart: RRR. S1 and S2 normal without gallop, rubs, murmur.  Abdomen:  BS normoactive. Soft, Nondistended, non-tender.  No masses or organomegaly.  Extremities: No pretibial edema.  Skin:                   Multiple small chronic skin lesions on upper extremities.   Assessment/ Plan:

## 2013-10-15 NOTE — Assessment & Plan Note (Signed)
Assessment: This is a chronic issue. She was to a dermatologist at [facility].  She states that no specific management her medication was prescribed.  Plan Will obtain office records from her dermatologist

## 2013-10-15 NOTE — Assessment & Plan Note (Signed)
Assessment: Patient states that she feels well today. Her MRI of right knee in August 2014 showed as the following "1. Nondisplaced insufficiency or stress fracture medial tibial  metaphysis.  2. Negative for meniscal or ligament tear."  [facility]   Plan: - will obtain office records

## 2013-10-15 NOTE — Assessment & Plan Note (Signed)
Assessment Denies blurry vision.  She wears contact and states that she may need her prescription adjusted She has not seen her ophthalmologist for over year due to financial strain.  Plan - Retina camera next week  - Encouraged patient to call her ophthalmologist office since she has active orange card now - Patient understands and agrees with the plan

## 2013-10-20 ENCOUNTER — Ambulatory Visit (HOSPITAL_COMMUNITY): Payer: Self-pay | Admitting: Psychiatry

## 2013-10-27 ENCOUNTER — Encounter (INDEPENDENT_AMBULATORY_CARE_PROVIDER_SITE_OTHER): Payer: Self-pay

## 2013-10-27 ENCOUNTER — Ambulatory Visit (INDEPENDENT_AMBULATORY_CARE_PROVIDER_SITE_OTHER): Payer: No Typology Code available for payment source | Admitting: Psychiatry

## 2013-10-27 DIAGNOSIS — F339 Major depressive disorder, recurrent, unspecified: Secondary | ICD-10-CM
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
## 2013-11-03 ENCOUNTER — Ambulatory Visit (HOSPITAL_COMMUNITY): Payer: Self-pay | Admitting: Psychiatry

## 2013-11-05 ENCOUNTER — Encounter (HOSPITAL_COMMUNITY): Payer: Self-pay | Admitting: Psychiatry

## 2013-11-05 ENCOUNTER — Encounter (INDEPENDENT_AMBULATORY_CARE_PROVIDER_SITE_OTHER): Payer: Self-pay

## 2013-11-05 ENCOUNTER — Ambulatory Visit (INDEPENDENT_AMBULATORY_CARE_PROVIDER_SITE_OTHER): Payer: No Typology Code available for payment source | Admitting: Psychiatry

## 2013-11-05 DIAGNOSIS — F339 Major depressive disorder, recurrent, unspecified: Secondary | ICD-10-CM
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
## 2013-11-10 ENCOUNTER — Ambulatory Visit (INDEPENDENT_AMBULATORY_CARE_PROVIDER_SITE_OTHER): Payer: No Typology Code available for payment source | Admitting: Psychiatry

## 2013-11-10 DIAGNOSIS — F339 Major depressive disorder, recurrent, unspecified: Secondary | ICD-10-CM

## 2013-11-10 NOTE — Progress Notes (Signed)
   THERAPIST PROGRESS NOTE  Session Time: 2:00-2:50 pm  Participation Level: Active  Behavioral Response: Neat and Well GroomedAlertDepressed  Type of Therapy: Individual Therapy  Treatment Goals addressed: Stress  Interventions: Solution Focused, Strength-based and Supportive  Summary: Christine Dunn is a 23 y.o. female who presents with moderate depressed mood and affect. Pt reports an increase in depression symptoms this past week stemming from disappointing results from her visit with the [facility] last week for her free consultation to have her skin condition looked at for treatment options. Pt said they did not feel qualified to treat her and [place] [facility]. Pt said she has seen numerous dermatologists and skin specialist and none of them could determine the cause of her skin flare ups on her arms and legs. The scarring is severe and Pt processed feelings of embarrassment and discouragement over having the skin condition and how difficult it is to go out in public with it. Pt also processed frustration with not having health insurance and having severe medical needs with her diabetes and teeth that she can not currently afford treatment for. Pt is awaiting the determination from Medicaid to see if she was approved, but she does not want to get her hopes up due to be denied so many times over the past three years. Pt said her depression is often hid from others because she does not want them to worry about her, but Pt disclosed to writer that she is isolating more away from others, staying in bed most of the day and feels sad a lot from not having her medical conditions better under control. Pt denied thoughts of suicide or self harm and agreed to contact writer if symptoms worsen. Pt also agreed that she would try to go out once this week with a friend of hers to challenge her tendency to isolate.   Suicidal/Homicidal: No  Therapist Response: Assessed  overall level of depression symptoms per Pt self report. Provided supportive therapy and reflective listening about Pts frustration over medical conditions and lack of health insurance, problem solved options and encouraged use of coping skills and increased socialization.   Plan: Return again in 1 week.  Diagnosis: Axis I: Depression Major Recurrent Moderate      Kealii Thueson E, LCSW 11/10/2013

## 2013-11-17 ENCOUNTER — Ambulatory Visit (HOSPITAL_COMMUNITY): Payer: Self-pay | Admitting: Psychiatry

## 2013-11-24 ENCOUNTER — Ambulatory Visit (INDEPENDENT_AMBULATORY_CARE_PROVIDER_SITE_OTHER): Payer: No Typology Code available for payment source | Admitting: Psychiatry

## 2013-11-24 DIAGNOSIS — F339 Major depressive disorder, recurrent, unspecified: Secondary | ICD-10-CM

## 2013-11-24 NOTE — Progress Notes (Signed)
   THERAPIST PROGRESS NOTE  Session Time: 2:00-2:50 pm  Participation Level: Active  Behavioral Response: CasualAlertDepressed  Type of Therapy: Individual Therapy  Treatment Goals addressed: School and Stress  Interventions: Solution Focused and Supportive  Summary: Christine Dunn is a 23 y.o. female who presents with mild depressed mood and affect. Pt said she has low energy, motivation, feels tired most of the day, lacks interest in activities and wants to isolate away from others. Pt did not associate it with depression, but could see the signs once pointed out to her. Pt could not identify triggers to the depression. She denied thoughts of self harm and said her depression does not ever get that bad, but said she just feels blah. Pt said her lack of health insurance is still a big stressor and she would like to be done with school, in a career and living on her own. Pt questions if she wants to be in school right now since she does not have a major she is pursing. Pt was open to thinking about if going to school is the right move or if she wants to look into jobs that have health insurance that could turn into careers. Pt also said she has some stress regarding her toy poodle dog possible needing surgery for a dislocated knee. Pt agrees to give thought to school for discussion at next session.    Suicidal/Homicidal: Bi  Therapist Response: Assessed overall level of depression per Pt self report, reviewed triggers for symptoms, educated on depression, discussed stressors regarding school and possible alternatives for careers with health insurance, provided supportive therapy for stress with her family and dog, encouraged use of depression coping skills.   Plan: Return again in 1 weeks.  Diagnosis: Axis I: Depression Major Recurrent Moderate       Jessen Siegman E, LCSW 11/24/2013

## 2013-11-25 ENCOUNTER — Ambulatory Visit (INDEPENDENT_AMBULATORY_CARE_PROVIDER_SITE_OTHER): Payer: No Typology Code available for payment source | Admitting: Psychiatry

## 2013-11-25 ENCOUNTER — Encounter (HOSPITAL_COMMUNITY): Payer: Self-pay | Admitting: Psychiatry

## 2013-11-25 VITALS — BP 135/93 | HR 127 | Ht 63.0 in | Wt 146.6 lb

## 2013-11-25 DIAGNOSIS — F329 Major depressive disorder, single episode, unspecified: Secondary | ICD-10-CM

## 2013-11-25 DIAGNOSIS — F339 Major depressive disorder, recurrent, unspecified: Secondary | ICD-10-CM

## 2013-11-25 MED ORDER — VENLAFAXINE HCL ER 150 MG PO CP24: 150.0000 mg | ORAL_CAPSULE | Freq: Every day | ORAL | Status: DC

## 2013-11-25 NOTE — Progress Notes (Signed)
[facility] [address] Progress Note  Christine Dunn [id number] 23 y.o.  11/25/2013 1:46 PM  Chief Complaint:  Medication management and followup.  History of Present Illness:  Teffany came for her followup appointment .  She is compliant with Effexor 150 mg daily.  She denies any side effects.  Recently she has a blood work and her hemoglobin A1c is 7.9.  She is very happy with her hemoglobin A1c because he used to be 12 .  She is scheduled to see eczema specialist on Norah 7 which has been more intense in recent weeks.  She's also seen [name] for counseling.  She denies any recent panic attack, crying spells or any severe anxiety attack.  Her sleep is improved from the past.  She denies any tremors or shakes.  Her energy level is good.  She does not require Ambien for insomnia .  She is not drinking or using any illegal substances.  She is going to [facility] regularly.    Suicidal Ideation: No Plan Formed: No Patient has means to carry out plan: No  Homicidal Ideation: No Plan Formed: No Patient has means to carry out plan: No  Medical History; Patient has diabetes mellitus, neuropathy, hyperlipidemia.  Her primary care physician is [name] and she is seeing [name] for the management of diabetes at [facility].  Psychosocial History; Patient was born and raised in [place].  Her parents are divorced.  She lives with her mother.  She has an older sister and a brother.  Patient has a good relationship with her boyfriend was very supportive.  Patient has no children.  Past Psychiatric History/Hospitalization(s) Patient denies any history of suicidal attempt, inpatient psychiatric treatment, mania, psychosis or any hallucination.  She was seen by [name] for depression a few years ago and prescribed Zoloft until 2 years ago it stopped working.  She endorses history of scratching herself causing injury to her skin. She denies any history of mood swing, aggression, violence or any  impulsive behavior. In the past she has taken trazodone but did not work. Anxiety: No Bipolar Disorder: No Depression: No Mania: No Psychosis: No Schizophrenia: No Personality Disorder: No Hospitalization for psychiatric illness: No History of Electroconvulsive Shock Therapy: No Prior Suicide Attempts: No   Review of Systems: Psychiatric: Agitation: No Hallucination: No Depressed Mood: No Insomnia: No Hypersomnia: No Altered Concentration: No Feels Worthless: No Grandiose Ideas: No Belief In Special Powers: No New/Increased Substance Abuse: No Compulsions: No  Neurologic: Headache: No Seizure: No Paresthesias: Yes    Outpatient Encounter Prescriptions as of 11/25/2013  Medication Sig  . esomeprazole (NEXIUM) 40 MG capsule Take 40 mg by mouth daily.   Marland Kitchen ibuprofen (ADVIL,MOTRIN) 200 MG tablet Take 400 mg by mouth once.  . insulin aspart (NOVOLOG) 100 UNIT/ML injection Inject 5 Units into the skin 3 (three) times daily before meals. Novolog TID. The goal of CBG is 150. Patient is to take additional 1 unit with every 50 of cbgs above 150. She is to take additional 1 unit for every 3 CHO per patient report( sees endocrinologist Dr.Smith).  . insulin glargine (LANTUS) 100 UNIT/ML injection Inject 50 Units into the skin at bedtime.   Marland Kitchen neomycin-bacitracin-polymyxin (NEOSPORIN) ointment Apply 1 application topically at bedtime. Apply to affected areas on arms  . norgestrel-ethinyl estradiol (LO/OVRAL,CRYSELLE) 0.3-30 MG-MCG tablet Take 1 tablet by mouth daily.  . pregabalin (LYRICA) 50 MG capsule Take 50 mg by mouth 2 (two) times daily.  Marland Kitchen venlafaxine XR (  EFFEXOR-XR) 150 MG 24 hr capsule Take 1 capsule (150 mg total) by mouth daily.  Marland Kitchen zolpidem (AMBIEN) 5 MG tablet Take 5 mg by mouth at bedtime as needed for sleep.  . [DISCONTINUED] venlafaxine XR (EFFEXOR-XR) 150 MG 24 hr capsule Take 1 capsule (150 mg total) by mouth daily.    Recent Results (from the past 2160 hour(s))   GLUCOSE, CAPILLARY     Status: Abnormal   Collection Time    10/15/13  1:54 PM      Result Value Ref Range   Glucose-Capillary 51 (*) 70 - 99 mg/dL   Comment 1 Documented in Chart     Comment 2 Call MD NNP PA CNM    POCT GLYCOSYLATED HEMOGLOBIN (HGB A1C)     Status: None   Collection Time    10/15/13  2:04 PM      Result Value Ref Range   Hemoglobin A1C 7.9    GLUCOSE, CAPILLARY     Status: None   Collection Time    10/15/13  2:51 PM      Result Value Ref Range   Glucose-Capillary 72  70 - 99 mg/dL  GLUCOSE, CAPILLARY     Status: Abnormal   Collection Time    10/15/13  3:25 PM      Result Value Ref Range   Glucose-Capillary 66 (*) 70 - 99 mg/dL  GLUCOSE, CAPILLARY     Status: Abnormal   Collection Time    10/15/13  4:04 PM      Result Value Ref Range   Glucose-Capillary 119 (*) 70 - 99 mg/dL  GLUCOSE, CAPILLARY     Status: Abnormal   Collection Time    10/15/13  4:54 PM      Result Value Ref Range   Glucose-Capillary 151 (*) 70 - 99 mg/dL      Physical Exam: Constitutional:  BP 135/93  Pulse 127  Ht 5\' 3"  (1.6 m)  Wt 146 lb 9.6 oz (66.497 kg)  BMI 25.98 kg/m2  Musculoskeletal: Strength & Muscle Tone: within normal limits Gait & Station: normal Patient leans: N/A  Mental Status Examination;  Patient is a young female who is well groomed and well dressed. She appears to be in stated age. She maintained good eye contact. She describes her mood good and her affect is mood appropriate.  She denies any active or passive suicidal partial homicidal thought. Her fund of knowledge is adequate. There were no tremors or shakes. She denies any hallucination, paranoia or any delusions. There were no flight of ideas or any loose association. Her thought processes is logical and goal directed. Her speech is clear and coherent. She is alert and oriented x3. Her insight judgment and impulse control is okay.    Established Problem, Stable/Improving (1), Review or order clinical  lab tests (1), Review of Last Therapy Session (1) and Review of Medication Regimen & Side Effects (2)  Assessment: Axis I: Maj. depressive disorder, recurrent  Axis II: Deferred  Axis III:  Past Medical History  Diagnosis Date  . Foot deformity, acquired     little toes, toes nail are falling off, needs surgery, but cant happen till AIC < 9  . Fatty liver   . Diabetic neuropathy 01/08/2012  . Depression 12/25/2011  . Diabetes type 1, uncontrolled 08/27/1997  . Hyperlipidemia LDL goal < 100 12/19/2011  . Superficial skin infection 12/18/2011    Recurrent for past 2 years   . Insomnia 01/30/2012    Axis IV:  Mild to moderate   Plan:  Patient is doing better on Effexor 150 mg daily.  Recommend to continue current dose.  Because [facility].  I will discontinue Ambien since patient is no longer using it.  Recommended to see Jan for counseling.  Followup in 3 months.  Recommended to call us back if she has any question or any concern.   Kiera Hussey T., MD 11/25/2013

## 2013-12-01 ENCOUNTER — Ambulatory Visit (HOSPITAL_COMMUNITY): Payer: Self-pay | Admitting: Psychiatry

## 2013-12-08 ENCOUNTER — Ambulatory Visit (INDEPENDENT_AMBULATORY_CARE_PROVIDER_SITE_OTHER): Payer: No Typology Code available for payment source | Admitting: Psychiatry

## 2013-12-08 ENCOUNTER — Encounter (INDEPENDENT_AMBULATORY_CARE_PROVIDER_SITE_OTHER): Payer: Self-pay

## 2013-12-08 DIAGNOSIS — F339 Major depressive disorder, recurrent, unspecified: Secondary | ICD-10-CM

## 2013-12-08 NOTE — Progress Notes (Signed)
   THERAPIST PROGRESS NOTE  Session Time: 2:00-2:50 pm  Participation Level: Active  Behavioral Response: Neat and Well GroomedAlertDepressed  Type of Therapy: Individual Therapy  Treatment Goals addressed: Stress and School  Interventions: CBT, Solution Focused and Supportive  Summary: Christine Dunn is a 23 y.o. female who presents with moderate depression based on the PHQ-9. Pt scored an 11/27. Pt said she was not surprised by the rating and feels moderately depressed. Pt said she did put more focus on her college after her last session and put thought into how to decide her major and begin taking classes that would get her closer towards one specific degree. Pt said she weighed her interests against her disinterest and decided to major in medical administration since she thinks she would be good at that and it is only six classes's away from her getting her degree in that field.  Pt said there is still drama on behalf of her ex-boyfriends wife in that she contacted Pt several times over the past week accusing her of seeing her ex-boyfriend, which Pt denies, and posting comments about Pt on facebook. Pt said she is ignoring her and has not had contact with either of them. Pt processed some feelings of discouragement regarding her recent visit to her dermatologist because they could not give her quick fix to the scarring on her arms and legs. Pt said they did help with some ideas to reduce the scaring and she is hopeful it will be better before she is in her friends wedding this July. Pt states he mood is overall stable and she would like to focus next session on how to reduce her depression score.   Suicidal/Homicidal: No  Therapist Response: Assessed overall level of functioning per Pt self report, administered the PHQ-9 to assess current depression level and discussed findings. Reviewed depression and stress triggers and coping strategies, encouraged continued use of coping skills.   Plan:  Return again in 1 week.   Diagnosis: Axis I: Depression Major Recurrent Moderate      Evelynn Hench E, LCSW 12/08/2013

## 2013-12-15 ENCOUNTER — Ambulatory Visit (INDEPENDENT_AMBULATORY_CARE_PROVIDER_SITE_OTHER): Payer: No Typology Code available for payment source | Admitting: Psychiatry

## 2013-12-15 ENCOUNTER — Encounter (INDEPENDENT_AMBULATORY_CARE_PROVIDER_SITE_OTHER): Payer: Self-pay

## 2013-12-15 DIAGNOSIS — F339 Major depressive disorder, recurrent, unspecified: Secondary | ICD-10-CM

## 2013-12-15 NOTE — Progress Notes (Signed)
   THERAPIST PROGRESS NOTE  Session Time: 3:00-3:50 pm  Participation Level: Active  Behavioral Response: Neat and Well GroomedAlertEuthymic  Type of Therapy: Individual Therapy  Treatment Goals addressed: Stress  Interventions: CBT and Supportive  Summary: Christine Dunn is a 23 y.o. female who presents with euthymic mood and affect. Pt reports stable mood since last session with minimal depression symptoms and appears to be at baseline level of functioning. Pt said her birthday was last week and she had a quiet day with cake with her mom. Pt said she was sick with allergies last week that caused her to sleep for two days, but feels much better now. Pt processed stress associated with upcoming finals in college in the next two weeks. Pt also discussed being the maid of honor in her friends wedding and having the wedding shower this weekend. Pt reflected on her life and how she is in a different place than the majority of her friends who are completing school and getting married. Pt said she wold like to meet someone and have a relationship, but does not know how she would meet them due to not getting out much. Pt also expressed some excitement over the possibility of moving into a trailer her dad owns that is not currently being used to live next door to her grandmother in [place]. Pt said she thinks that might happen as soon as this summer and she is looking forward to it.    Suicidal/Homicidal: No  Therapist Response: Assessed overall level of functioning per Pt self report and depression symptoms, reviewed stressors and coping skills to manage them, processed feelings associated with life transitions of self and friends. Discussed school exams and how to be successful, encouraged continued use of coping skills.   Plan: Return again in 1 weeks.  Diagnosis: Axis I: Depression Major Recurrent Moderate, in remission       Christine Dunn E, LCSW 12/15/2013

## 2013-12-22 ENCOUNTER — Ambulatory Visit (INDEPENDENT_AMBULATORY_CARE_PROVIDER_SITE_OTHER): Payer: No Typology Code available for payment source | Admitting: Psychiatry

## 2013-12-22 ENCOUNTER — Encounter (INDEPENDENT_AMBULATORY_CARE_PROVIDER_SITE_OTHER): Payer: Self-pay

## 2013-12-22 DIAGNOSIS — F329 Major depressive disorder, single episode, unspecified: Secondary | ICD-10-CM

## 2013-12-22 DIAGNOSIS — F339 Major depressive disorder, recurrent, unspecified: Secondary | ICD-10-CM

## 2013-12-22 DIAGNOSIS — F32A Depression, unspecified: Secondary | ICD-10-CM

## 2013-12-22 NOTE — Progress Notes (Signed)
   THERAPIST PROGRESS NOTE  Session Time: 2:00-2:50 pm  Participation Level: Active  Behavioral Response: Neat and Well GroomedAlertEuthymic  Type of Therapy: Individual Therapy  Treatment Goals addressed: Depression  Interventions: CBT and Solution Focused  Summary: Christine Dunn is a 23 y.o. female who presents with euthymic mood and affect. Pt reports her main stressors this week are 1) exams for school and 2) her ex-boyfriends wife. Pt said she finished one exam for her college courses and was stressed by how the teacher had the test prepared. Pt said she did not have enough time to finish the exam and was stressed by that. Pt said she has two more classes with exams and then will be officially done with school this Sunday and have a break from all classes through the summer. Pt said her main stressor above school was that she was contacted again by her ex-boyfriends wife who continues to harass her with phone and text messages with harassing content about Pt still having romantic relations with her her husband. Pt denied having any contact with him and said this woman is "crazy". Pt did not want to file a restraining order or change her phone number. Pt appears to not be quick to stop these interactions, but agreed she had options if she felt taking action was necessary to stop the contact. Pt said mood remains stable overall with minimal depression symptoms. Pt appears to be at baseline level of functioning.  Pt still plans on suspending counseling until writer returns from maternity leave.   Suicidal/Homicidal: No  Therapist Response: Assessed overall level of functioning per Pt self report, explored stressors and options for reducing and managing them, encouraged Pt to take action regarding harrassment by ex's wife and educated Pt on safety precautions regarding her harassing behaviors. Finalized maternity leave plan.   Plan: Return again in 1 weeks.  Diagnosis: Axis I: Depression  Major Recurrent Moderate      Branda Chaudhary E, LCSW 12/22/2013

## 2013-12-30 ENCOUNTER — Ambulatory Visit (INDEPENDENT_AMBULATORY_CARE_PROVIDER_SITE_OTHER): Payer: No Typology Code available for payment source | Admitting: Psychiatry

## 2013-12-30 ENCOUNTER — Encounter (INDEPENDENT_AMBULATORY_CARE_PROVIDER_SITE_OTHER): Payer: Self-pay

## 2013-12-30 DIAGNOSIS — F339 Major depressive disorder, recurrent, unspecified: Secondary | ICD-10-CM

## 2013-12-30 NOTE — Progress Notes (Signed)
   THERAPIST PROGRESS NOTE  Session Time: 10:00-10:50 am   Participation Level: Active  Behavioral Response: Neat and Well GroomedAlertEuthymic  Type of Therapy: Individual Therapy  Treatment Goals addressed: Stress  Interventions: Solution Focused and Supportive  Summary: Krisa L Althia FortsMcAden is a 23 y.o. female who presents with euthymic mood and affect. Pt said her mood has remained stable and described primarily life stressors as her main focus this week. Pt said she is done with school and now has a 3.0 GPA, which is high enough to reinstate her student financial aid for next semester. Pt said she still has plans to finish her degree by the end of next year with her six classes and new chosen major. Pt said her main stressor this week is not having health insurance and trying to afford all her doctor specialist visits. Pt described needing intensive dental work due to her diabetes and how that has negatively impacted her teeth enamel and going to an eye specialist for bleeding on her right eye that may require surgery. Pt said she is in the appeal process for Medicaid and is also working with [employer] to see if she can get approved for disability insurance but her hearing is not until the end of July.    Suicidal/Homicidal: No   Therapist Response: Assessed overall level of functioning per Pt self report, processed stressors, provided supportive therapy and identified possible solutions to minimize stressors.   Plan: Return again in 1 weeks.  Diagnosis: Axis I: Depression Major Recurrent Mild      Allura Doepke E, LCSW 12/30/2013

## 2014-01-07 ENCOUNTER — Ambulatory Visit (INDEPENDENT_AMBULATORY_CARE_PROVIDER_SITE_OTHER): Payer: No Typology Code available for payment source | Admitting: Psychiatry

## 2014-01-07 ENCOUNTER — Encounter (INDEPENDENT_AMBULATORY_CARE_PROVIDER_SITE_OTHER): Payer: Self-pay

## 2014-01-07 DIAGNOSIS — F339 Major depressive disorder, recurrent, unspecified: Secondary | ICD-10-CM

## 2014-01-07 NOTE — Progress Notes (Signed)
   THERAPIST PROGRESS NOTE  Session Time: 10:00-10:45 am   Participation Level: Active  Behavioral Response: Neat and Well GroomedAlertEuthymic  Type of Therapy: Individual Therapy  Treatment Goals addressed: Stress and School  Interventions: CBT, Solution Focused and Supportive  Summary: Christine Dunn is a 23 y.o. female who presents with euthymic mood and affect and reports being  An 8/10 with her happiness level with 10 being high. Pt processed progress in treatment and explored he possibility of reducing sessions to every other week or once monthly since Pt has met the majority of her treatment goals and has maintained stable mood over time. Pt said she felt uncertain and fearful due to this being a structured part of her wellness routine for so long. Pt agreed to think about it for discussion at next session. Pt is going to suspend counseling services until after writer returns from maternity leave and this will also give insight into Pts overall stability of using coping skills.  Pt requested a letter to take to her school to assist with the appeal process for her financial aid - see below for letter provided during session.    Date: Jan 07, 2014  RE: Christine Dunn DOB: 21-Oct-1990 Diagnosis: Depression Major Recurrent Moderate   To Whom It May Concern:  This letter is in regards to and her current diagnosis of Depression Major Recurrent Moderate.  Ms. Ponce has been receiving counseling services under the under the medical care of Gracelyn Nurse, MSW, LCSW from June 09, 2013 to present for depression and anxiety associated with both academic and personal stressors.  Ms. Purington has worked diligently to improve her academic standing following the difficult academic year she had in 2014, which was the onset of her seeking counseling services.  She has made significant progress over the past academic year with both professional and academic goals and has succeeded at improving  her overall GPA to a 3.0 with all of her hard work and effort.  I am aware that Ms. Baze is in the process of appealing the decision that was made to deny her student financial aid for the 2015-2016 school year and I am more than happy to provide any assistance I can provide to help with this appeal process. If you need further information from me, please let me know.  Thank you so much for your cooperation in this matter.    Sincerely,   Gracelyn Nurse, MSW, LCSW [phone]   ** If you require additional information, please contact the above patient to obtain a written authorization to release protected health information. Pensions consultant do not permit release of information without a signed authorization. The patient may stop by the Hospital to complete an Authorization to [place].  Thank you for your cooperation in this regard.     Suicidal/Homicidal: No   Therapist Response: Assessed overall level of functioning, reviewed progress in treatment and explored reducing sessions based on progress made, processed feelings of uncertain about that, wrote letter for Pt for school.   Plan: Return again in 1 week and then suspend counseling until writer returns from maternity leave.   Diagnosis: Axis I: Depression Major Recurrent      Detron Carras E, LCSW 01/07/2014

## 2014-01-09 ENCOUNTER — Emergency Department (HOSPITAL_COMMUNITY)
Admission: EM | Admit: 2014-01-09 | Discharge: 2014-01-09 | Disposition: A | Discharge status: Home / Self Care | Payer: Medicaid Other | Attending: Emergency Medicine | Admitting: Emergency Medicine

## 2014-01-09 ENCOUNTER — Encounter (HOSPITAL_COMMUNITY): Payer: Self-pay | Admitting: Emergency Medicine

## 2014-01-09 DIAGNOSIS — Z8739 Personal history of other diseases of the musculoskeletal system and connective tissue: Secondary | ICD-10-CM | POA: Diagnosis not present

## 2014-01-09 DIAGNOSIS — Z792 Long term (current) use of antibiotics: Secondary | ICD-10-CM | POA: Diagnosis not present

## 2014-01-09 DIAGNOSIS — Z8719 Personal history of other diseases of the digestive system: Secondary | ICD-10-CM | POA: Insufficient documentation

## 2014-01-09 DIAGNOSIS — F3289 Other specified depressive episodes: Secondary | ICD-10-CM | POA: Insufficient documentation

## 2014-01-09 DIAGNOSIS — Z79899 Other long term (current) drug therapy: Secondary | ICD-10-CM | POA: Diagnosis not present

## 2014-01-09 DIAGNOSIS — R000 Tachycardia, unspecified: Secondary | ICD-10-CM | POA: Diagnosis not present

## 2014-01-09 DIAGNOSIS — N390 Urinary tract infection, site not specified: Secondary | ICD-10-CM | POA: Diagnosis not present

## 2014-01-09 DIAGNOSIS — R110 Nausea: Secondary | ICD-10-CM | POA: Diagnosis not present

## 2014-01-09 DIAGNOSIS — E1142 Type 2 diabetes mellitus with diabetic polyneuropathy: Secondary | ICD-10-CM | POA: Insufficient documentation

## 2014-01-09 DIAGNOSIS — R51 Headache: Secondary | ICD-10-CM | POA: Insufficient documentation

## 2014-01-09 DIAGNOSIS — F329 Major depressive disorder, single episode, unspecified: Secondary | ICD-10-CM | POA: Diagnosis not present

## 2014-01-09 DIAGNOSIS — Z880 Allergy status to penicillin: Secondary | ICD-10-CM | POA: Diagnosis not present

## 2014-01-09 DIAGNOSIS — E1049 Type 1 diabetes mellitus with other diabetic neurological complication: Secondary | ICD-10-CM | POA: Insufficient documentation

## 2014-01-09 DIAGNOSIS — E1065 Type 1 diabetes mellitus with hyperglycemia: Secondary | ICD-10-CM | POA: Diagnosis not present

## 2014-01-09 DIAGNOSIS — Z794 Long term (current) use of insulin: Secondary | ICD-10-CM | POA: Diagnosis not present

## 2014-01-09 DIAGNOSIS — E162 Hypoglycemia, unspecified: Secondary | ICD-10-CM

## 2014-01-09 DIAGNOSIS — Z872 Personal history of diseases of the skin and subcutaneous tissue: Secondary | ICD-10-CM | POA: Insufficient documentation

## 2014-01-09 LAB — URINALYSIS, ROUTINE W REFLEX MICROSCOPIC
Bilirubin Urine: NEGATIVE
GLUCOSE, UA: 250 mg/dL — AB
HGB URINE DIPSTICK: NEGATIVE
NITRITE: POSITIVE — AB
Protein, ur: NEGATIVE mg/dL
SPECIFIC GRAVITY, URINE: 1.022 (ref 1.005–1.030)
Urobilinogen, UA: 1 mg/dL (ref 0.0–1.0)
pH: 6.5 (ref 5.0–8.0)

## 2014-01-09 LAB — COMPREHENSIVE METABOLIC PANEL
ALBUMIN: 3.4 g/dL — AB (ref 3.5–5.2)
ALK PHOS: 83 U/L (ref 39–117)
ALT: 14 U/L (ref 0–35)
AST: 19 U/L (ref 0–37)
BUN: 7 mg/dL (ref 6–23)
CALCIUM: 9.5 mg/dL (ref 8.4–10.5)
CO2: 22 mEq/L (ref 19–32)
Chloride: 104 mEq/L (ref 96–112)
Creatinine, Ser: 0.51 mg/dL (ref 0.50–1.10)
GFR calc non Af Amer: >90 mL/min (ref >90)
GLUCOSE: 141 mg/dL — AB (ref 70–99)
POTASSIUM: 4.7 meq/L (ref 3.7–5.3)
SODIUM: 142 meq/L (ref 137–147)
TOTAL PROTEIN: 7.4 g/dL (ref 6.0–8.3)
Total Bilirubin: 0.3 mg/dL (ref 0.3–1.2)

## 2014-01-09 LAB — CBG MONITORING, ED
GLUCOSE-CAPILLARY: 140 mg/dL — AB (ref 70–99)
Glucose-Capillary: 109 mg/dL — ABNORMAL HIGH (ref 70–99)
Glucose-Capillary: 228 mg/dL — ABNORMAL HIGH (ref 70–99)

## 2014-01-09 LAB — CBC
HCT: 42.5 % (ref 36.0–46.0)
Hemoglobin: 13.8 g/dL (ref 12.0–15.0)
MCH: 26 pg (ref 26.0–34.0)
MCHC: 32.5 g/dL (ref 30.0–36.0)
MCV: 80 fL (ref 78.0–100.0)
PLATELETS: 298 10*3/uL (ref 150–400)
RBC: 5.31 MIL/uL — ABNORMAL HIGH (ref 3.87–5.11)
RDW: 15.3 % (ref 11.5–15.5)
WBC: 14.8 10*3/uL — ABNORMAL HIGH (ref 4.0–10.5)

## 2014-01-09 LAB — URINE MICROSCOPIC-ADD ON

## 2014-01-09 MED ORDER — ONDANSETRON HCL 4 MG/2ML IJ SOLN
4.0000 mg | Freq: Once | INTRAMUSCULAR | Status: AC
Start: 2014-01-09 — End: 2014-01-09
  Administered 2014-01-09: 4 mg via INTRAVENOUS
  Filled 2014-01-09: qty 2

## 2014-01-09 MED ORDER — ACETAMINOPHEN 325 MG PO TABS
650.0000 mg | ORAL_TABLET | Freq: Once | ORAL | Status: AC
Start: 2014-01-09 — End: 2014-01-09
  Administered 2014-01-09: 650 mg via ORAL
  Filled 2014-01-09: qty 2

## 2014-01-09 MED ORDER — NITROFURANTOIN MONOHYD MACRO 100 MG PO CAPS: 100.0000 mg | ORAL_CAPSULE | Freq: Two times a day (BID) | ORAL | Status: DC

## 2014-01-09 NOTE — ED Provider Notes (Signed)
I saw and evaluated the patient, reviewed the resident's note and I agree with the findings and plan.   EKG Interpretation None       23 yo female with hx of Type 1 DM presenting with hypoglycemia.  She awoke this morning, felt that her sugar was high, and took additional insulin prior to checking her blood glucose.  Several hours later she awoke feeling groggy and called her mother.  On exam, well appearing, alert, sitting upright in bed, eating a meal, lungs clear, heart sounds normal.  Tolerated food and blood sugar stabilized. Plan DC home with return precautions and treatment for her UTI.  Clinical Impression: 1. UTI (lower urinary tract infection)   2. Hypoglycemia       Candyce ChurnJohn David Deletha Jaffee III, MD 01/10/14 [phone]

## 2014-01-09 NOTE — ED Notes (Signed)
[facility]. Pt has hx of Diabetes type 1 that has been uncontrolled. When EMS arrived CBG in the 30s. EMS administered 12.5 of D50 and CBG increased to 168. When looking at home glucometer pt had CBG hx anywhere between 40s-600.  BP-134/90 HR-118 O2sat-100% RA. Pt also c/o headache that comes and goes. EMS also administered Zofran 4mg  and a 500ml Bolus of fluid.

## 2014-01-09 NOTE — ED Provider Notes (Signed)
CSN: [id number]     Arrival date & time 01/09/14  1609 History   First MD Initiated Contact with Patient 01/09/14 1631     Chief Complaint  Patient presents with  . Hypoglycemia   Patient is a 23 year old female with past medical history relevant for type 1 diabetes who presents with hypoglycemia. Patient reports that she woke up this morning and was nauseated and felt as if blood sugar was high. She took 15 units of NovoLog. She was unable to check her blood sugar because the meter was broken. She reports falling asleep and awakening up around 1 PM. She reported that she was confused, sweating, and very fatigued. EMS was called and they reported a blood sugar of 30. Patient would not tolerate by mouth intake and thus was given D50. Blood sugar increased to 168 and patient transported here to the emergency department. Patient has not had any recent changes in her insulin regimen which includes 50 units of Lantus at night and sliding scale NovoLog.  She has no complaints other than increased urinary frequency.    (Consider location/radiation/quality/duration/timing/severity/associated sxs/prior Treatment) Patient is a 23 y.o. female presenting with general illness. The history is provided by the patient, a relative and a parent. No language interpreter was used.  Illness Severity:  Severe Onset quality:  Unable to specify Timing:  Intermittent Progression:  Improving Chronicity:  New Associated symptoms: headaches and nausea   Associated symptoms: no abdominal pain, no congestion, no cough, no loss of consciousness and no shortness of breath     Past Medical History  Diagnosis Date  . Foot deformity, acquired     little toes, toes nail are falling off, needs surgery, but cant happen till AIC < 9  . Fatty liver   . Diabetic neuropathy 01/08/2012  . Depression 12/25/2011  . Diabetes type 1, uncontrolled 08/27/1997  . Hyperlipidemia LDL goal < 100 12/19/2011  . Superficial skin infection  12/18/2011    Recurrent for past 2 years   . Insomnia 01/30/2012   Past Surgical History  Procedure Laterality Date  . Wrist surgery      rerouted tendons in wrist   Family History  Problem Relation Age of Onset  . Diabetes Maternal Grandfather   . Depression Mother    History  Substance Use Topics  . Smoking status: Never Smoker   . Smokeless tobacco: Not on file  . Alcohol Use: No   OB History   Grav Para Term Preterm Abortions TAB SAB Ect Mult Living                 Review of Systems  HENT: Negative for congestion.   Respiratory: Negative for cough and shortness of breath.   Gastrointestinal: Positive for nausea. Negative for abdominal pain.  Neurological: Positive for headaches. Negative for loss of consciousness.  All other systems reviewed and are negative.     Allergies  Gabapentin (phn) and Penicillins  Home Medications   Prior to Admission medications   Medication Sig Start Date End Date Taking? Authorizing Provider  estradiol cypionate (DEPO-ESTRADIOL) 5 MG/ML injection Inject into the muscle every 28 (twenty-eight) days.   Yes Historical Provider, MD  esomeprazole (NEXIUM) 40 MG capsule Take 40 mg by mouth daily.     Historical Provider, MD  insulin aspart (NOVOLOG) 100 UNIT/ML injection Inject 5 Units into the skin 3 (three) times daily before meals. Novolog TID. The goal of CBG is 150. Patient is to take additional 1 unit with every  50 of cbgs above 150. She is to take additional 1 unit for every 3 CHO per patient report( sees endocrinologist Dr.Smith). 10/30/12   Dede QueryNa Li, MD  insulin glargine (LANTUS) 100 UNIT/ML injection Inject 50 Units into the skin at bedtime.  01/08/12   Melida QuitterAmanjot Sidhu, MD  neomycin-bacitracin-polymyxin (NEOSPORIN) ointment Apply 1 application topically at bedtime. Apply to affected areas on arms    Historical Provider, MD  pregabalin (LYRICA) 50 MG capsule Take 50 mg by mouth 2 (two) times daily.    Historical Provider, MD  venlafaxine XR  (EFFEXOR-XR) 150 MG 24 hr capsule Take 1 capsule (150 mg total) by mouth daily. 11/25/13 11/25/14  Cleotis NipperSyed T Arfeen, MD   BP 119/83  Pulse 106  Temp(Src) 98.5 F (36.9 C) (Oral)  Resp 16  Ht 5\' 3"  (1.6 m)  Wt 147 lb (66.679 kg)  BMI 26.05 kg/m2  SpO2 97%  LMP 11/09/2013 Physical Exam  Nursing note and vitals reviewed. Constitutional: She is oriented to person, place, and time. She appears well-developed and well-nourished. No distress.  HENT:  Head: Normocephalic and atraumatic.  Right Ear: External ear normal.  Mouth/Throat: Oropharynx is clear and moist.  Eyes: Conjunctivae are normal. Pupils are equal, round, and reactive to light.  Neck: Normal range of motion. Neck supple.  Cardiovascular: Regular rhythm and intact distal pulses.  Tachycardia present.   Pulmonary/Chest: Effort normal and breath sounds normal.  Abdominal: Soft. Bowel sounds are normal. She exhibits no distension. There is no tenderness.  Musculoskeletal: Normal range of motion. She exhibits no edema and no tenderness.  Neurological: She is alert and oriented to person, place, and time.  Skin: Skin is warm and dry.  Psychiatric: She has a normal mood and affect.    ED Course  Procedures (including critical care time) Labs Review Labs Reviewed  CBC - Abnormal; Notable for the following:    WBC 14.8 (*)    RBC 5.31 (*)    All other components within normal limits  COMPREHENSIVE METABOLIC PANEL - Abnormal; Notable for the following:    Glucose, Bld 141 (*)    Albumin 3.4 (*)    All other components within normal limits  URINALYSIS, ROUTINE W REFLEX MICROSCOPIC - Abnormal; Notable for the following:    APPearance CLOUDY (*)    Glucose, UA 250 (*)    Ketones, ur >80 (*)    Nitrite POSITIVE (*)    Leukocytes, UA LARGE (*)    All other components within normal limits  URINE MICROSCOPIC-ADD ON - Abnormal; Notable for the following:    Squamous Epithelial / LPF MANY (*)    Bacteria, UA MANY (*)    All other  components within normal limits  CBG MONITORING, ED - Abnormal; Notable for the following:    Glucose-Capillary 140 (*)    All other components within normal limits  CBG MONITORING, ED - Abnormal; Notable for the following:    Glucose-Capillary 109 (*)    All other components within normal limits  CBG MONITORING, ED - Abnormal; Notable for the following:    Glucose-Capillary 228 (*)    All other components within normal limits  URINE CULTURE    Imaging Review No results found.   EKG Interpretation None      MDM   Final diagnoses:  UTI (lower urinary tract infection)  Hypoglycemia    Patient presents to the emergency department after EMS found patient to be hypoglycemic. Please see history of present illness for further details. Her  vital signs were remarkable for heart rate of 106 upon arrival but during my exam HR ess than 100 without intervention. Physical exam as above and unremarkable including no source of focal infection and no neurological deficits. Blood sugar check here after EMS gave D50 and 140.  Basic labs show no metabolic derangement.  Given urinary frequency, UA obtained and showed evidence of UTI.  Patient was given zofran and able to tolerate PO intake.  POCT BG checked multiple times and remained >100.  Likely etiology of hypoglycemia attributed to fact that patient took novolog but did not eat after.  Will discharge with Macrobid, close PCP/endocrine followup, and return precautions given.     Johnney Ou, MD 01/09/14 [phone]

## 2014-01-10 NOTE — ED Provider Notes (Signed)
I saw and evaluated the patient, reviewed the resident's note and I agree with the findings and plan.   EKG Interpretation None        Candyce ChurnJohn David Keaun Schnabel III, MD 01/10/14 [phone]

## 2014-01-12 LAB — URINE CULTURE

## 2014-01-13 ENCOUNTER — Ambulatory Visit (INDEPENDENT_AMBULATORY_CARE_PROVIDER_SITE_OTHER): Payer: No Typology Code available for payment source | Admitting: Psychiatry

## 2014-01-13 ENCOUNTER — Encounter: Payer: Self-pay | Admitting: Internal Medicine

## 2014-01-13 ENCOUNTER — Ambulatory Visit (INDEPENDENT_AMBULATORY_CARE_PROVIDER_SITE_OTHER): Payer: No Typology Code available for payment source | Admitting: Internal Medicine

## 2014-01-13 VITALS — BP 124/87 | HR 104 | Temp 97.7°F | Wt 146.9 lb

## 2014-01-13 DIAGNOSIS — F339 Major depressive disorder, recurrent, unspecified: Secondary | ICD-10-CM

## 2014-01-13 DIAGNOSIS — E1065 Type 1 diabetes mellitus with hyperglycemia: Secondary | ICD-10-CM

## 2014-01-13 DIAGNOSIS — IMO0002 Reserved for concepts with insufficient information to code with codable children: Secondary | ICD-10-CM

## 2014-01-13 LAB — POCT GLYCOSYLATED HEMOGLOBIN (HGB A1C): HEMOGLOBIN A1C: 7.3

## 2014-01-13 LAB — GLUCOSE, CAPILLARY: GLUCOSE-CAPILLARY: 206 mg/dL — AB (ref 70–99)

## 2014-01-13 NOTE — Progress Notes (Signed)
Subjective:   Patient ID: Christine Dunn female   DOB: 09/23/1990 23 y.o.   MRN: [id number]  Chief Complaint  Patient presents with  . Diabetes    HPI: [name] is a 23 y.o. woman with h/o DM1, HLD, Depression and fatty liver that presents for DM follow up.  Here with her mother today - applying for "really good insurance" - for disability with medicaid, has appeal coming up.  She is asking for a letter of support in needing extensive health care due to her ongoing medical concerns.  She has been diabetic since age 89.    Sat EMS brought her to ED on 01/09/14- CBG dropped to 40s.  She seems to be VERY brittle with her CBGs.  She has gone to several dermatologists for a skin rash, and gets different diagnosis by each one.    Has tried to get a job through a program for those with disability, but without result for 2 years.    Depo shot 2.5 months ago (Dr. Arelia SneddonMcComb)  Candace Apple's Office is handling her appeal: [address], [place], [place] [address] [phone]  Invokana by Dr. Katrinka BlazingSmith (Endocrinologist)  Review of Systems: Constitutional: Denies fever, chills, diaphoresis. Poor appetite and energy. HEENT: Recently left eye was sore to touch, but has resolved.  Denies photophobia, eye pain, redness, hearing loss, ear pain, congestion, sore throat, rhinorrhea, sneezing, mouth sores, trouble swallowing, neck pain, neck stiffness and tinnitus.  Respiratory: Denies SOB, DOE, cough, chest tightness, and wheezing.  Cardiovascular: Denies chest pain, palpitations and leg swelling.  Gastrointestinal: Denies abdominal pain, diarrhea, constipation,blood in stool and abdominal distention. N/V from hypoglycemia over the weekend, resolved. Genitourinary: Denies dysuria, urgency, frequency, hematuria, flank pain and difficulty urinating.  Musculoskeletal: Denies myalgias, back pain, joint swelling, arthralgias and gait problem.  Skin: scabs and hyperpigmented patches and papules on arms  and legs  Neurological: Denies dizziness, seizures, syncope, weakness, lightheadedness. +neuropathy in toes (pins & needles) Hematological: No obvious s/s of bleeding   Past Medical History  Diagnosis Date  . Foot deformity, acquired     little toes, toes nail are falling off, needs surgery, but cant happen till AIC < 9  . Fatty liver   . Diabetic neuropathy 01/08/2012  . Depression 12/25/2011  . Diabetes type 1, uncontrolled 08/27/1997  . Hyperlipidemia LDL goal < 100 12/19/2011  . Superficial skin infection 12/18/2011    Recurrent for past 2 years   . Insomnia 01/30/2012   Current Outpatient Prescriptions  Medication Sig Dispense Refill  . Canagliflozin (INVOKANA) 100 MG TABS Take 100 mg by mouth daily.      Marland Kitchen. esomeprazole (NEXIUM) 40 MG capsule Take 40 mg by mouth daily.       . insulin aspart (NOVOLOG) 100 UNIT/ML injection Inject 5 Units into the skin 3 (three) times daily before meals. Novolog TID. The goal of CBG is 150. Patient is to take additional 1 unit with every 50 of cbgs above 150. She is to take additional 1 unit for every 3 CHO per patient report( sees endocrinologist Dr.Smith).      . insulin glargine (LANTUS) 100 UNIT/ML injection Inject 50 Units into the skin at bedtime.       . medroxyPROGESTERone (DEPO-PROVERA) 150 MG/ML injection Inject 150 mg into the muscle every 3 (three) months.      . neomycin-bacitracin-polymyxin (NEOSPORIN) ointment Apply 1 application topically at bedtime. Apply to affected areas on arms      .  nitrofurantoin, macrocrystal-monohydrate, (MACROBID) 100 MG capsule Take 1 capsule (100 mg total) by mouth 2 (two) times daily. X 7 days  14 capsule  0  . pregabalin (LYRICA) 50 MG capsule Take 50 mg by mouth 2 (two) times daily.      Marland Kitchen. venlafaxine XR (EFFEXOR-XR) 150 MG 24 hr capsule Take 1 capsule (150 mg total) by mouth daily.  30 capsule  2   No current facility-administered medications for this visit.   Family History  Problem Relation Age of Onset    . Diabetes Maternal Grandfather   . Depression Mother    History   Social History  . Marital Status: Single    Spouse Name: N/A    Number of Children: N/A  . Years of Education: N/A   Social History Main Topics  . Smoking status: Never Smoker   . Smokeless tobacco: None  . Alcohol Use: No  . Drug Use: No  . Sexual Activity: None   Other Topics Concern  . None   Social History Narrative  . None    Objective:  Physical Exam: Filed Vitals:   01/13/14 1543 01/13/14 1551  BP: 124/87   Pulse: 122 104  Temp: 97.7 F (36.5 C)   TempSrc: Oral   Weight: 146 lb 14.4 oz (66.633 kg)   SpO2: 98%    HEENT: dilated pupils (just came from retina MD), EOMI, no scleral icterus Cardiac: RRR, no rubs, murmurs or gallops Pulm: clear to auscultation bilaterally, moving normal volumes of air Abd: soft, nontender, nondistended, BS normoactive Ext: warm and well perfused, no pedal edema Neuro: alert and oriented X3, cranial nerves II-XII grossly intact Skin: several hyperpigmented patches and papules on b/l arms with scabs  Assessment & Plan:  Case and care discussed with Dr. Meredith PelJoines.  Please see problem oriented charting for further details.

## 2014-01-13 NOTE — Progress Notes (Signed)
   THERAPIST PROGRESS NOTE  Session Time: 10:00-10:50 am   Participation Level: Active  Behavioral Response: Casual, Neat and Well GroomedAlertEuthymic  Type of Therapy: Individual Therapy  Treatment Goals addressed: Depression  Interventions: CBT and Supportive  Summary: Christine Dunn is a 23 y.o. female who presents with euthymic mood and affect. Pt reports stable mood but said she had a very stressful week. Pt described an incident that occurred where her blood sugar dropped into the 30's and she was taken by ambulance [facility]. Pt said they regulated her blood sugar and released her same day. Pt said she is in a new relationship with a guy that started this past week and is excited about it. Pt processed feelings of exciting and said she is looking forward to having more social outlets with him this summer. 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   Suicidal/Homicidal: No  Therapist Response: Assessed overall level of functioning per Pt self report, processed stressors from past week and provided supportive therapy, reviewed depression stability and maternity plan.   Plan: Return again in 1 weeks.  Diagnosis: Axis I: Depression Major Recurrent Moderate      Godfrey Tritschler E, LCSW 01/13/2014

## 2014-01-13 NOTE — Patient Instructions (Signed)
General Instructions: - I will work on a letter and leave it with the front desk for you  -Please stop invokana and discuss with your endocrinologist as soon as possible   Please try to bring all your medicines next time. This will help us keep you safe from mistakes.   Progress Toward Treatment Goals:  Treatment Goal 01/13/2014  Hemoglobin A1C improved    Self Care Goals & Plans:  Self Care Goal 04/20/2013  Manage my medications take my medicines as prescribed; bring my medications to every visit; follow the sick day instructions if I am sick  Monitor my health keep track of my blood glucose; keep track of my blood pressure; bring my glucose meter and log to each visit; check my feet daily  Eat healthy foods eat foods that are low in salt; drink diet soda or water instead of juice or soda  Be physically active -    Home Blood Glucose Monitoring 11/20/2012  Check my blood sugar 4 times a day  When to check my blood sugar before breakfast; before lunch; before dinner; at bedtime     Care Management & Community Referrals:  Referral 10/15/2013  Referrals made for care management support diabetes educator  Referrals made to community resources -

## 2014-01-13 NOTE — Assessment & Plan Note (Addendum)
Patient carb counts. She is followed by Dr. Katrinka BlazingSmith of [facility].  She takes novolog (amt dependent on carbs) with lantus 50u qHS.  She also takes invocana daily.  Given her recent UTI and that Invokana should not be used in Type 1 DM, I have asked her to stop this medication and discuss with her endocrinologist right away.  I tried to call Dr. Katrinka BlazingSmith, but it was after 5pm, will try tomorrow.  She also requests a letter of support for her medicaid/disability appeal.  I will need more information prior to providing this letter - she noted I could speak with her attorney, Candace Apple, but will need patient to sign release before I can do this.

## 2014-01-14 ENCOUNTER — Telehealth: Payer: Self-pay | Admitting: Internal Medicine

## 2014-01-14 ENCOUNTER — Telehealth (HOSPITAL_BASED_OUTPATIENT_CLINIC_OR_DEPARTMENT_OTHER): Payer: Self-pay | Admitting: Emergency Medicine

## 2014-01-14 NOTE — Progress Notes (Signed)
Case discussed with Dr. Sharda soon after the resident saw the patient.  We reviewed the resident's history and exam and pertinent patient test results.  I agree with the assessment, diagnosis, and plan of care documented in the resident's note. 

## 2014-01-14 NOTE — Telephone Encounter (Signed)
Post ED Visit - Positive Culture Follow-up  Culture report reviewed by antimicrobial stewardship pharmacist: [x]  Wes Dulaney, Pharm.D., BCPS []  Celedonio MiyamotoJeremy Frens, [address] BCPS []  Georgina PillionElizabeth Martin, Pharm.D., BCPS []  [place]h Pham, [place] BCPS, AAHIVP []  Estella HuskMichelle Turner, Pharm.D., BCPS, AAHIVP []  Harvie JuniorNathan Cope, Pharm.D.  Positive urine culture Treated with Macrobid, organism sensitive to the same and no further patient follow-up is required at this time.  Elsey Holts 01/14/2014, 10:22 AM

## 2014-01-14 NOTE — Telephone Encounter (Signed)
Left msg for patient at 1:50pm that she needs to sign release for me to be able to discuss the letter she requests with her attorney.  Also, called Dr. Michaelle CopasSmith's office (endocrinologist, [facility]) to let him know about her recent UTI, and my instructions for her to stop invokana.  He was unavailble - I left a msg to be sent to him, and left clinic number as well.   Stacy GardnerNeema Sriman Tally, MD ([id number])

## 2014-01-21 ENCOUNTER — Ambulatory Visit (HOSPITAL_COMMUNITY): Payer: Self-pay | Admitting: Psychiatry

## 2014-01-21 ENCOUNTER — Encounter: Payer: Self-pay | Admitting: Internal Medicine

## 2014-02-03 ENCOUNTER — Emergency Department (HOSPITAL_COMMUNITY)
Admission: EM | Admit: 2014-02-03 | Discharge: 2014-02-03 | Disposition: A | Discharge status: Home / Self Care | Payer: Medicaid Other | Attending: Emergency Medicine | Admitting: Emergency Medicine

## 2014-02-03 ENCOUNTER — Encounter (HOSPITAL_COMMUNITY): Payer: Self-pay | Admitting: Emergency Medicine

## 2014-02-03 DIAGNOSIS — F3289 Other specified depressive episodes: Secondary | ICD-10-CM | POA: Diagnosis not present

## 2014-02-03 DIAGNOSIS — Z792 Long term (current) use of antibiotics: Secondary | ICD-10-CM | POA: Diagnosis not present

## 2014-02-03 DIAGNOSIS — F329 Major depressive disorder, single episode, unspecified: Secondary | ICD-10-CM | POA: Insufficient documentation

## 2014-02-03 DIAGNOSIS — E1049 Type 1 diabetes mellitus with other diabetic neurological complication: Secondary | ICD-10-CM | POA: Insufficient documentation

## 2014-02-03 DIAGNOSIS — M25569 Pain in unspecified knee: Secondary | ICD-10-CM | POA: Diagnosis not present

## 2014-02-03 DIAGNOSIS — Z8719 Personal history of other diseases of the digestive system: Secondary | ICD-10-CM | POA: Insufficient documentation

## 2014-02-03 DIAGNOSIS — B350 Tinea barbae and tinea capitis: Secondary | ICD-10-CM | POA: Diagnosis not present

## 2014-02-03 DIAGNOSIS — Z880 Allergy status to penicillin: Secondary | ICD-10-CM | POA: Diagnosis not present

## 2014-02-03 DIAGNOSIS — Z872 Personal history of diseases of the skin and subcutaneous tissue: Secondary | ICD-10-CM | POA: Insufficient documentation

## 2014-02-03 DIAGNOSIS — G8929 Other chronic pain: Secondary | ICD-10-CM | POA: Diagnosis not present

## 2014-02-03 DIAGNOSIS — E1142 Type 2 diabetes mellitus with diabetic polyneuropathy: Secondary | ICD-10-CM | POA: Insufficient documentation

## 2014-02-03 DIAGNOSIS — Z3202 Encounter for pregnancy test, result negative: Secondary | ICD-10-CM | POA: Insufficient documentation

## 2014-02-03 DIAGNOSIS — F32A Depression, unspecified: Secondary | ICD-10-CM

## 2014-02-03 DIAGNOSIS — E119 Type 2 diabetes mellitus without complications: Secondary | ICD-10-CM | POA: Diagnosis present

## 2014-02-03 DIAGNOSIS — Z79899 Other long term (current) drug therapy: Secondary | ICD-10-CM | POA: Insufficient documentation

## 2014-02-03 DIAGNOSIS — Z794 Long term (current) use of insulin: Secondary | ICD-10-CM | POA: Insufficient documentation

## 2014-02-03 DIAGNOSIS — M25561 Pain in right knee: Secondary | ICD-10-CM

## 2014-02-03 HISTORY — DX: Pain in right knee: M25.561

## 2014-02-03 HISTORY — DX: Other chronic pain: G89.29

## 2014-02-03 LAB — CBC WITH DIFFERENTIAL/PLATELET
BASOS ABS: 0 10*3/uL (ref 0.0–0.1)
Basophils Relative: 0 % (ref 0–1)
Eosinophils Absolute: 0 10*3/uL (ref 0.0–0.7)
Eosinophils Relative: 0 % (ref 0–5)
HEMATOCRIT: 44 % (ref 36.0–46.0)
HEMOGLOBIN: 14.9 g/dL (ref 12.0–15.0)
LYMPHS PCT: 32 % (ref 12–46)
Lymphs Abs: 3.3 10*3/uL (ref 0.7–4.0)
MCH: 27.5 pg (ref 26.0–34.0)
MCHC: 33.9 g/dL (ref 30.0–36.0)
MCV: 81.2 fL (ref 78.0–100.0)
MONOS PCT: 7 % (ref 3–12)
Monocytes Absolute: 0.8 10*3/uL (ref 0.1–1.0)
NEUTROS ABS: 6.5 10*3/uL (ref 1.7–7.7)
Neutrophils Relative %: 61 % (ref 43–77)
Platelets: 319 10*3/uL (ref 150–400)
RBC: 5.42 MIL/uL — ABNORMAL HIGH (ref 3.87–5.11)
RDW: 14.9 % (ref 11.5–15.5)
WBC: 10.6 10*3/uL — AB (ref 4.0–10.5)

## 2014-02-03 LAB — COMPREHENSIVE METABOLIC PANEL
ALBUMIN: 4 g/dL (ref 3.5–5.2)
ALT: 12 U/L (ref 0–35)
AST: 15 U/L (ref 0–37)
Alkaline Phosphatase: 93 U/L (ref 39–117)
BILIRUBIN TOTAL: 0.5 mg/dL (ref 0.3–1.2)
BUN: 10 mg/dL (ref 6–23)
CHLORIDE: 98 meq/L (ref 96–112)
CO2: 24 meq/L (ref 19–32)
Calcium: 10 mg/dL (ref 8.4–10.5)
Creatinine, Ser: 0.58 mg/dL (ref 0.50–1.10)
GFR calc Af Amer: >90 mL/min (ref >90)
GFR calc non Af Amer: >90 mL/min (ref >90)
Glucose, Bld: 95 mg/dL (ref 70–99)
Potassium: 3.9 mEq/L (ref 3.7–5.3)
Sodium: 140 mEq/L (ref 137–147)
Total Protein: 8.4 g/dL — ABNORMAL HIGH (ref 6.0–8.3)

## 2014-02-03 LAB — URINE MICROSCOPIC-ADD ON

## 2014-02-03 LAB — CBG MONITORING, ED
GLUCOSE-CAPILLARY: 50 mg/dL — AB (ref 70–99)
GLUCOSE-CAPILLARY: 129 mg/dL — AB (ref 70–99)
Glucose-Capillary: 116 mg/dL — ABNORMAL HIGH (ref 70–99)

## 2014-02-03 LAB — URINALYSIS, ROUTINE W REFLEX MICROSCOPIC
BILIRUBIN URINE: NEGATIVE
HGB URINE DIPSTICK: NEGATIVE
Ketones, ur: 40 mg/dL — AB
LEUKOCYTES UA: NEGATIVE
Nitrite: NEGATIVE
PROTEIN: NEGATIVE mg/dL
Specific Gravity, Urine: 1.027 (ref 1.005–1.030)
Urobilinogen, UA: 0.2 mg/dL (ref 0.0–1.0)
pH: 6 (ref 5.0–8.0)

## 2014-02-03 LAB — POC URINE PREG, ED: PREG TEST UR: NEGATIVE

## 2014-02-03 MED ORDER — TERBINAFINE HCL 250 MG PO TABS: 250.0000 mg | ORAL_TABLET | Freq: Every day | ORAL | Status: DC

## 2014-02-03 NOTE — ED Notes (Signed)
CBG 129. RN notified. 

## 2014-02-03 NOTE — ED Notes (Signed)
Pt verbalizes she is looking for a new PCP and came to [facility] c/o R [place]nee pain, fluctuating blood sugars, and depression. Pt recently started Invocana.

## 2014-02-03 NOTE — ED Notes (Signed)
Pt denies nausea after eating, no distress noted

## 2014-02-03 NOTE — ED Notes (Signed)
Case management at bedside.

## 2014-02-03 NOTE — ED Notes (Signed)
Patient Christine Dunn is 79, she had requested a recheck.  She has snacks.  Will relocate to exam area

## 2014-02-03 NOTE — ED Notes (Signed)
CBG 116  

## 2014-02-03 NOTE — ED Provider Notes (Signed)
CSN: [id number]     Arrival date & time 02/03/14  1227 History   First MD Initiated Contact with Patient 02/03/14 1502     Chief Complaint  Patient presents with  . Hyperglycemia      HPI Pt was seen at 1510. Per pt and her mother, c/o gradual onset and persistence of constant scalp "rash" for the past several weeks. Pt describes the rash as "flaking." Pt's mother states pt "picks at it." Pt's mother also c/o gradual onset and persistence of waxing and waning home CBG's for the past 1 month, as well as acute flair of her chronic right knee "pain" for the past 1 year. Pt has been followed by Ortho Dr. Eulah Pont for her chronic knee pain, and [facility] clinic for her chronic medical conditions. Pt's mother states they came to the ED to "get help making an appointment with the [facility] clinic because it's the time of year the residents are turning over." Pt also requesting to see [facility] "so they can refer [place] again to psychiatry." States she was last seen by Psych MD several weeks ago but "needs another appointment." Denies any change in pt's usual chronic symptoms. Pt has been able to tol PO well without N/V, no diarrhea, no fevers, no rash, no abd pain, no CP/SOB, no SI/HI/hallucinations.    Past Medical History  Diagnosis Date  . Foot deformity, acquired     little toes, toes nail are falling off, needs surgery, but cant happen till AIC < 9  . Fatty liver   . Diabetic neuropathy 01/08/2012  . Depression 12/25/2011  . Diabetes type 1, uncontrolled 08/27/1997  . Hyperlipidemia LDL goal < 100 12/19/2011  . Superficial skin infection 12/18/2011    Recurrent for past 2 years   . Insomnia 01/30/2012   Past Surgical History  Procedure Laterality Date  . Wrist surgery      rerouted tendons in wrist   Family History  Problem Relation Age of Onset  . Diabetes Maternal Grandfather   . Depression Mother    History  Substance Use Topics  . Smoking status: Never Smoker   . Smokeless tobacco: Not on file  . Alcohol  Use: No    Review of Systems ROS: Statement: All systems negative except as marked or noted in the HPI; Constitutional: Negative for fever and chills. ; ; Eyes: Negative for eye pain, redness and discharge. ; ; ENMT: Negative for ear pain, hoarseness, nasal congestion, sinus pressure and sore throat. ; ; Cardiovascular: Negative for chest pain, palpitations, diaphoresis, dyspnea and peripheral edema. ; ; Respiratory: Negative for cough, wheezing and stridor. ; ; Gastrointestinal: Negative for nausea, vomiting, diarrhea, abdominal pain, blood in stool, hematemesis, jaundice and rectal bleeding. . ; ; Genitourinary: Negative for dysuria, flank pain and hematuria. ; ; Musculoskeletal: +chronic knee pain. Negative for back pain and neck pain. Negative for swelling and trauma.; ; Skin: +rash. Negative for abrasions, blisters, bruising and +chronic skin lesions bilat UE's and LE's..; ; Neuro: Negative for headache, lightheadedness and neck stiffness. Negative for weakness, altered level of consciousness , altered mental status, extremity weakness, paresthesias, involuntary movement, seizure and syncope.; Psych:  +chronic depression. No SI, no SA, no HI, no hallucinations.       Allergies  Gabapentin (phn) and Penicillins  Home Medications   Prior to Admission medications   Medication Sig Start Date End Date Taking? Authorizing Provider  esomeprazole (NEXIUM) 40 MG capsule Take 40 mg by mouth daily.    Yes  Historical Provider, MD  insulin aspart (NOVOLOG) 100 UNIT/ML injection Inject 5 Units into the skin 3 (three) times daily before meals. Novolog TID. The goal of CBG is 150. Patient is to take additional 1 unit with every 50 of cbgs above 150. She is to take additional 1 unit for every 3 CHO per patient report( sees endocrinologist Dr.Smith). 10/30/12  Yes Na Li, MD  insulin glargine (LANTUS) 100 UNIT/ML injection Inject 50 Units into the skin at bedtime.  01/08/12  Yes Melida Quitter, MD   neomycin-bacitracin-polymyxin (NEOSPORIN) ointment Apply 1 application topically at bedtime. Apply to affected areas on arms   Yes Historical Provider, MD  pregabalin (LYRICA) 50 MG capsule Take 50 mg by mouth 2 (two) times daily.   Yes Historical Provider, MD  venlafaxine XR (EFFEXOR-XR) 150 MG 24 hr capsule Take 1 capsule (150 mg total) by mouth daily. 11/25/13 11/25/14 Yes Cleotis Nipper, MD  medroxyPROGESTERone (DEPO-PROVERA) 150 MG/ML injection Inject 150 mg into the muscle every 3 (three) months.    Historical Provider, MD   BP 108/78  Pulse 115  Temp(Src) 98.7 F (37.1 C) (Oral)  Resp 22  SpO2 100%  LMP 11/09/2013 Physical Exam 1515: Physical examination:  Nursing notes reviewed; Vital signs and O2 SAT reviewed;  Constitutional: Well developed, Well nourished, Well hydrated, In no acute distress; Head:  Normocephalic, atraumatic; Eyes: EOMI, PERRL, No scleral icterus; ENMT: Mouth and pharynx normal, Mucous membranes moist; Neck: Supple, Full range of motion, No lymphadenopathy; Cardiovascular: Regular rate and rhythm, No gallop; Respiratory: Breath sounds clear & equal bilaterally, No rales, rhonchi, wheezes.  Speaking full sentences with ease, Normal respiratory effort/excursion; Chest: Nontender, Movement normal; Abdomen: Soft, Nontender, Nondistended, Normal bowel sounds; Genitourinary: No CVA tenderness; Extremities: Pulses normal, No tenderness, No edema, No deformity. No calf edema or asymmetry.; Neuro: AA&Ox3, Major CN grossly intact.  Speech clear. No gross focal motor or sensory deficits in extremities.; Skin: Color normal, Warm, Dry. +scattered scabs and hyperpigmented patches on bilat UE's and LE's. +several areas of tinea rash on scalp.; Psych:  Calm, cooperative.    ED Course  Procedures     EKG Interpretation None      MDM  MDM Reviewed: previous chart, nursing note and vitals Reviewed previous: labs Interpretation: labs    Results for orders placed during the  hospital encounter of 02/03/14  CBC WITH DIFFERENTIAL      Result Value Ref Range   WBC 10.6 (*) 4.0 - 10.5 K/uL   RBC 5.42 (*) 3.87 - 5.11 MIL/uL   Hemoglobin 14.9  12.0 - 15.0 g/dL   HCT 61.6  83.7 - 29.0 %   MCV 81.2  78.0 - 100.0 fL   MCH 27.5  26.0 - 34.0 pg   MCHC 33.9  30.0 - 36.0 g/dL   RDW 21.1  15.5 - 20.8 %   Platelets 319  150 - 400 K/uL   Neutrophils Relative % 61  43 - 77 %   Neutro Abs 6.5  1.7 - 7.7 K/uL   Lymphocytes Relative 32  12 - 46 %   Lymphs Abs 3.3  0.7 - 4.0 K/uL   Monocytes Relative 7  3 - 12 %   Monocytes Absolute 0.8  0.1 - 1.0 K/uL   Eosinophils Relative 0  0 - 5 %   Eosinophils Absolute 0.0  0.0 - 0.7 K/uL   Basophils Relative 0  0 - 1 %   Basophils Absolute 0.0  0.0 - 0.1 K/uL  COMPREHENSIVE METABOLIC PANEL      Result Value Ref Range   Sodium 140  137 - 147 mEq/L   Potassium 3.9  3.7 - 5.3 mEq/L   Chloride 98  96 - 112 mEq/L   CO2 24  19 - 32 mEq/L   Glucose, Bld 95  70 - 99 mg/dL   BUN 10  6 - 23 mg/dL   Creatinine, Ser 1.610.58  0.50 - 1.10 mg/dL   Calcium 09.610.0  8.4 - 04.510.5 mg/dL   Total Protein 8.4 (*) 6.0 - 8.3 g/dL   Albumin 4.0  3.5 - 5.2 g/dL   AST 15  0 - 37 U/L   ALT 12  0 - 35 U/L   Alkaline Phosphatase 93  39 - 117 U/L   Total Bilirubin 0.5  0.3 - 1.2 mg/dL   GFR calc non Af Amer >90  >90 mL/min   GFR calc Af Amer >90  >90 mL/min  URINALYSIS, ROUTINE W REFLEX MICROSCOPIC      Result Value Ref Range   Color, Urine YELLOW  YELLOW   APPearance HAZY (*) CLEAR   Specific Gravity, Urine 1.027  1.005 - 1.030   pH 6.0  5.0 - 8.0   Glucose, UA >1000 (*) NEGATIVE mg/dL   Hgb urine dipstick NEGATIVE  NEGATIVE   Bilirubin Urine NEGATIVE  NEGATIVE   Ketones, ur 40 (*) NEGATIVE mg/dL   Protein, ur NEGATIVE  NEGATIVE mg/dL   Urobilinogen, UA 0.2  0.0 - 1.0 mg/dL   Nitrite NEGATIVE  NEGATIVE   Leukocytes, UA NEGATIVE  NEGATIVE  URINE MICROSCOPIC-ADD ON      Result Value Ref Range   Squamous Epithelial / LPF FEW (*) RARE   WBC, UA 0-2  <3  WBC/hpf   RBC / HPF 0-2  <3 RBC/hpf   Bacteria, UA FEW (*) RARE  POC URINE PREG, ED      Result Value Ref Range   Preg Test, Ur NEGATIVE  NEGATIVE  CBG MONITORING, ED      Result Value Ref Range   Glucose-Capillary 116 (*) 70 - 99 mg/dL   Comment 1 Notify RN     Comment 2 Documented in Chart    CBG MONITORING, ED      Result Value Ref Range   Glucose-Capillary 50 (*) 70 - 99 mg/dL  CBG MONITORING, ED      Result Value Ref Range   Glucose-Capillary 129 (*) 70 - 99 mg/dL   Comment 1 Notify RN     Comment 2 Documented in Chart       1600:  Pt has tol PO well while in the ED without N/V. VS per baseline per EPIC chart review. Scalp rash appears tinea, will tx with PO antifungal.  Pt referred back to Ortho MD for her chronic knee pain. [facility], case discussed, including:  HPI, pertinent PM/SHx, VS/PE, dx testing, ED course and treatment:  Resident has made an appt for pt tomorrow at 2:45pm. EPIC chart reviewed: pt already has Psych MD appt on 7/1 and Psych Counsellor on 7/6. Pt and her mother reminded of this. Pt and her mother verb understanding. Pt states she is ready to go home now. Dx and testing d/w pt and family.  Questions answered.  Verb understanding, agreeable to d/c home with outpt f/u tomorrow.       Laray AngerKathleen M Miyoshi Ligas, DO 02/05/14 1718

## 2014-02-03 NOTE — ED Notes (Signed)
MD at bedside. 

## 2014-02-03 NOTE — Discharge Instructions (Signed)
°Emergency Department Resource Guide °1) Find a Doctor and Pay Out of Pocket °Although you won't have to find out who is covered by your insurance plan, it is a good idea to ask around and get recommendations. You will then need to call the office and see if the doctor you have chosen will accept you as a new patient and what types of options they offer for patients who are self-pay. Some doctors offer discounts or will set up payment plans for their patients who do not have insurance, but you will need to ask so you aren't surprised when you get to your appointment. ° °2) Contact Your Local Health Department °Not all health departments have doctors that can see patients for sick visits, but many do, so it is worth a call to see if yours does. If you don't know where your local health department is, you can check in your phone book. The CDC also has a tool to help you locate your state's health department, and many state websites also have listings of all of their local health departments. ° °3) Find a Walk-in Clinic °If your illness is not likely to be very severe or complicated, you may want to try a walk in clinic. These are popping up all over the country in pharmacies, drugstores, and shopping centers. They're usually staffed by nurse practitioners or physician assistants that have been trained to treat common illnesses and complaints. They're usually fairly quick and inexpensive. However, if you have serious medical issues or chronic medical problems, these are probably not your best option. ° °No Primary Care Doctor: °- Call Health Connect at  832-8000 - they can help you locate a primary care doctor that  accepts your insurance, provides certain services, etc. °- Physician Referral Service- 1-800-533-3463 ° °Chronic Pain Problems: °Organization         Address  Phone   Notes  °[facility]  ([phone]) [phone] Patients need to be referred by their primary care doctor.  ° °Medication  Assistance: °Organization         Address  Phone   Notes  °[place] [facility] [address] °[place], [place] [address] (336) 641-8030 --Must be a resident of [place] °-- Must have NO insurance coverage whatsoever (no Medicaid/ Medicare, etc.) °-- The pt. MUST have a primary care doctor that directs their care regularly and follows them in the community °  °[facility]  (866) 331-1348   °[employer]  (888) 892-1162   ° °Agencies that provide inexpensive medical care: °Organization         Address  Phone   Notes  °[facility]  (336) 832-8035   °[facility]    (336) 832-7272   °[facility] [address] °[place], [place] [address] (336) 832-4777   °[facility] [address], °[place] (336) 271-4999   °[facility]    (336) 373-0678   °[facility]    (336) 272-1050   °[facility] ° [address], [place] Phone:  (336) 832-4444, Fax:  (336) 832-4440 Hours of Operation:  9 am - 6 pm, M-F.  Also accepts Medicaid/Medicare and self-pay.  °[facility] ° [address], [place] Phone: (336) [phone], Fax: (336) [phone]. Hours of Operation:  8:30 am - 5:30 pm, M-F.  Also accepts Medicaid and self-pay.  °[facility] [place] [address], [place] Phone: (336) 878-6027   °[facility] [address], [place], [place] (336)723-1848, Ext. 123 Mondays & Thursdays: 7-9 AM.  First 15 patients are seen on a first come, first serve basis. °  ° °Medicaid-accepting [place] Providers: ° °Organization         Address  Phone   Notes  °[facility] [address], [place] (336) 641-2100 Also accepts self-pay patients.  °[facility] [address], [place] ° (336) [phone]   °[facility] [address], [place]  (336) 288-8857   °[facility] [address] [place] [address], [place] (336) 299-7000   °Veita Bland [address], [place]  ° (336) [phone] Only accepts [place] Access Medicaid patients after they have their name applied to their card.  ° °Self-Pay (no insurance) in [place]: ° °Organization         Address  Phone   Notes  °Sickle Cell Patients, [facility] [address], [place] (336) 832-1970   °[facility] [address], [place] (336) 832-4400   °[facility] ° [address], [place] (336) 992-4800   °[facility]/Dr. Osei-Bonsu ° [address] [place] [address], [place] or [address], [place] (336) [phone] Phone number for both [place] and [place] locations is the same.  °[facility] [address], [place] (336) 299-0000   °[facility] [address] [place] [address], [place] or [address] (336) 852-7530 °(336) 878-2260   °[facility] [address], [place] (336) 350-1642, phone; (336) 294-5005, fax Sees patients 1st and 3rd Saturday of every month.  Must not qualify for public or private insurance (i.e. Medicaid, Medicare, [place] Health Choice, Veterans' Benefits) • Household income should be no more than 200% of the poverty level •The clinic cannot treat you if you are pregnant or think you are pregnant • Sexually transmitted diseases are not treated at the clinic.  ° ° °Dental Care: °Organization         Address  Phone  Notes  °[place] [facility] [address], [place] (336) 641-6152 Accepts children up to age 21 who are enrolled in Medicaid or [place] Health Choice; pregnant women with a Medicaid card; and children who have applied for Medicaid or [place] Health Choice, but were declined, whose parents can pay a reduced fee at time of service.  °[place]  [facility] [place]  [address], [place] (336) 641-7733 Accepts children up to age 21 who are enrolled in Medicaid or [place] Health Choice; pregnant women with a Medicaid card; and children who have applied for Medicaid or [place] Health Choice, but were declined, whose parents can pay a reduced fee at time of service.  °[facility] ° [address], [place] (336) 641-4533 Patients are seen by appointment only. Walk-ins are not accepted. [facility] will see patients 18 years of age and older. °Monday - Tuesday (8am-5pm) °Most Wednesdays (8:30-5pm) °$30 per visit, cash only  °[facility] ° [address], [place] (336) 641-4533 Patients are seen by appointment only. Walk-ins are not accepted. [facility] will see patients 18 years of age and older. °One   Wednesday Evening (Monthly: Volunteer Based).  $30 per visit, cash only  °[facility]  (919) 537-3737 for adults; Children under age 4, call Graduate Pediatric Dentistry at (919) [phone]. Children aged 4-14, please call (919) [phone] to request a pediatric application. ° Dental services are provided in all areas of dental care including fillings, crowns and bridges, complete and partial dentures, implants, gum treatment, root canals, and extractions. Preventive care is also provided. Treatment is provided to both adults and children. °Patients are selected via a lottery and there is often a waiting list. °  °[facility] [address], °[place] ° (336) 763-8833 [email or web address] °  °[facility] [address], [place], [place] (336)723-1848, Ext. 123 Second and Fourth Thursday of each month, opens at 6:30 AM; Clinic ends at 9 AM.  Patients are seen on a first-come first-served basis, and a limited number are seen during each clinic.  ° °[facility] ° [address], [place], [place] (336) [phone]    Eligibility Requirements °You must have lived in [place], [place], or [place] counties for at least the last three months. °  You cannot be eligible for state or federal sponsored healthcare insurance, including Veterans Administration, Medicaid, or Medicare. °  You generally cannot be eligible for healthcare insurance through your employer.  °  How to apply: °Eligibility screenings are held every Tuesday and Wednesday afternoon from 1:00 pm until 4:00 pm. You do not need an appointment for the interview!  °[facility] [address], [place], [place] 336-631-2330   °[facility]  336-342-8273   °[place]  336-703-3100   °[facility]  336-570-6415   ° °Behavioral Health Resources in the Community: °Intensive Outpatient Programs °Organization         Address  Phone  Notes  °[facility] [address], [place], [place] 336-878-6098   °[facility] [address], [place], [place] 336-832-9800   °[facility] [address], [place], [place] ° 336-882-2125   °[facility] [address],  °[place], [place] 1-800-853-5163 or 336-641-4981   °Substance Abuse Resources °Organization         Address  Phone  Notes  °[facility]  336-882-2125   °[facility]  336-784-9470   °[facility]  336-285-9073   °[facility]  336-845-3988   °Residential & Outpatient Substance Abuse Program  1-800-659-3381   °Psychological Services °Organization         Address  Phone  Notes  °[facility]  336- 832-9600   °[facility]  336- 378-7881   °[facility] [address], [place] 1-800-853-5163 or 336-641-4981   ° °Mobile Crisis Teams °Organization         Address  Phone  Notes  °[facility], Mobile Crisis Care Unit  1-877-626-1772   °Assertive °[facility] ° [address]  [place], [place] 336-834-9664   °Sharon DeEsch [address] °[place] 336-554-5454   ° °Self-Help/Support Groups °Organization         Address  Phone             Notes  °[facility]  - variety of support groups  336- 373-1402 Call for more information  °Narcotics Anonymous (NA), [facility] [address], °[place]  2 meetings at this location  ° °  Residential Treatment Programs Organization         Address  Phone  Notes  [facility] [address]    [place]  3-016-010-9323   [facility]  [address] [place] [address], [place], [place] 025-427-0623   [facility] [address] [place], [place] 762-831-5176 Admissions: 8am-3pm M-F  [facility] [address]    [place], [place] 160-737-1062   [facility] [address] [place], [place], [place] 694-854-6270   [facility] [address]  [place], [place] 350-093-8182   [facility] - Intensive Outpatient [address], [place], [place] 993-716-9678   [facility] ([facility] [address] [place]  [place], [place] 9-381-017-5102 or 805-529-0152   [facility] ([facility] [address] [place], [place] 353-614-4315 Accepts Medicaid  [facility] [address]  [place] 4-008-676-1950 Substance Abuse/Addiction Treatment   [facility]         Address  Phone  Notes  [facility]  732-319-8948   Angie Fava, PhD [address] [place], [place]   281 141 0706 or (909)234-6526   [facility]   [address] [place], [place] 720-457-3408   [facility] [address], [place], [place] (778)436-6416 Insurance/Medicaid/sponsorship through [facility] [address]                                    [place], [place] [phone] Therapy/tele-psych/case    [facility] [address], [place] [phone]    Dr. Lolly Mustache  [phone]   [facility]  [employer] [facility] Dept. 1) [address] [place] 2) [address] [place] 3)  [address], [place] [phone]  [phone]   [facility] Child Abuse Hotline (684)350-2169 or 754-191-4312 (After Hours)       Take the prescription as directed.  Keep your appointments as previously scheduled. Return to the Emergency Department immediately sooner if worsening.

## 2014-02-03 NOTE — ED Notes (Signed)
Pt presents to department for evaluation of hyperglycemia and nausea. Pt also states bilateral leg cramping. CBG 299 at home this morning. Pt is alert and oriented x4.

## 2014-02-04 ENCOUNTER — Ambulatory Visit: Payer: Self-pay | Admitting: Internal Medicine

## 2014-02-05 ENCOUNTER — Encounter: Payer: Self-pay | Admitting: Licensed Clinical Social Worker

## 2014-02-05 ENCOUNTER — Encounter: Payer: Self-pay | Admitting: Internal Medicine

## 2014-02-05 ENCOUNTER — Ambulatory Visit (INDEPENDENT_AMBULATORY_CARE_PROVIDER_SITE_OTHER): Payer: No Typology Code available for payment source | Admitting: Internal Medicine

## 2014-02-05 VITALS — BP 122/82 | HR 118 | Temp 98.4°F | Ht 63.0 in | Wt 146.8 lb

## 2014-02-05 DIAGNOSIS — IMO0002 Reserved for concepts with insufficient information to code with codable children: Secondary | ICD-10-CM

## 2014-02-05 DIAGNOSIS — E1065 Type 1 diabetes mellitus with hyperglycemia: Secondary | ICD-10-CM

## 2014-02-05 DIAGNOSIS — E11319 Type 2 diabetes mellitus with unspecified diabetic retinopathy without macular edema: Secondary | ICD-10-CM

## 2014-02-05 DIAGNOSIS — F339 Major depressive disorder, recurrent, unspecified: Secondary | ICD-10-CM

## 2014-02-05 DIAGNOSIS — E1039 Type 1 diabetes mellitus with other diabetic ophthalmic complication: Secondary | ICD-10-CM

## 2014-02-05 DIAGNOSIS — E785 Hyperlipidemia, unspecified: Secondary | ICD-10-CM

## 2014-02-05 LAB — GLUCOSE, CAPILLARY: Glucose-Capillary: 147 mg/dL — ABNORMAL HIGH (ref 70–99)

## 2014-02-05 NOTE — Assessment & Plan Note (Signed)
Repeat Lipid Panel today

## 2014-02-05 NOTE — Assessment & Plan Note (Addendum)
Patient still with significant depressive symptoms; sadness, sleep disturbances, agitation, anxiety, difficulty concentrating, decreased energy level, lack of motivation. However, patient denies suicidal ideations. 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 This is somewhat worrisome. Anxiety seems to be a large component to this as well. Has taken Zoloft in the past but felt this did not help her much. Changed to Effexor which she claims helped at first but now is not doing much either. Patient [facility] and has [facility] appointment on 02/24/14, however, mother is concerned and felt she needs to be seen sooner. Some concern that mother may contribute to patient's anxiety and depression (please see details in HPI).   -Discussed w/ [facility], patient to be seen 02/09/14 for further management of depression and anxiety.  -Continue Effexor for now. -Follow up in 4 weeks.

## 2014-02-05 NOTE — Assessment & Plan Note (Addendum)
Lab Results  Component Value Date   HGBA1C 7.3 01/13/2014   HGBA1C 7.9 10/15/2013   HGBA1C 8.0 04/20/2013     Assessment: Diabetes control: good control (HgbA1C at goal) Progress toward A1C goal:  deteriorated Comments: Patient w/ good control according to most recent HbA1c (see above). According to home glucose monitoring, patient w/ significant highs and significant lows at varying times during the day. Recent ED visit 2/2 CBG in the 40's. Takes Lantus 50 units in PM + Novolog SS based on carb counting (1 unit:3 g carbs). Says she has not been eating well, has been eating chocolate. Blood sugar control is likely heavily affected by her underlying depression as well. Patient has lost 14 lbs over the past ~6 months, therefore her TTD would be less at this point.   Plan: Medications:  continue current medications (see above). Patient to see Dr. Katrinka BlazingSmith (endocrinologist) today. If continues to have significant LOWS, would decrease total Lantus dose in the future.  Home glucose monitoring: Frequency: 3 times a day Timing: before meals Instruction/counseling given: reminded to bring blood glucose meter & log to each visit Educational resources provided: brochure Self management tools provided: copy of home glucose meter download Other plans: Follow up in 4 weeks.  -As described above, would suggest decrease in Lantus at next visit if patient continues to have significant lows.

## 2014-02-05 NOTE — Assessment & Plan Note (Signed)
Patient supposed to have retinal camera exam today, however, patient claims she has recently been seen by an ophthalmologist [facility]?).  -Will attempt to get records from here regarding DM retinopathy

## 2014-02-05 NOTE — Progress Notes (Signed)
Subjective:   Patient ID: Christine Dunn female   DOB: 1990-11-13 23 y.o.   MRN: [id number]  HPI: [name] is a 23 y.o. y/o female w/ PMHx of DM type I, HLD, and depression, presents to the clinic today accompanied by her mother, for a follow-up visit after being seen in the ED on 02/03/14 for hypoglycemia and tinea capitis. Most recent HbA1c was 7.3 on 01/13/14. Patient takes Lantus 50 units qhs + Novolog SS of 1:50 for CBG's >150 as well as 1 units per 3 g carbohydrates. She claims to have been very compliant w/ her medications and says she checks her blood sugars regularly, however, she is still having significant difficulty controlling her blood sugars. After careful review of home CBG's, patient has very large variations in AM fasting blood sugars, from 40-500. The patient claims she is unclear why these variations occur, however, it seems that it is most likely 2/2 eating chocolate and overcompensating w/ Novolog, or not compensating at all. The patient's uncontrolled DM is complicated by her significant depression and anxiety. The patient has been taking Effexor XR 150 mg po qd for quite some time, but still claims to have overwhelming sadness, decreased energy, poor appetite, difficulty concentrating, sleep disturbance, agitation, anxiety, and irritability. When asked about suicidal ideations, patient says "I sometimes feel like cutting myself, but never would because it would disappoint my mom". [name] follows w/ Dr. Lolly Mustache at [facility] and has an appointment scheduled on 02/24/14.  OF NOTE; Almost entire interview was conducted w/ the mother outside the room. At the beginning of the interview, she seemed to dominate the conversation, interrupting the patient, answering most questions, making it difficult for the patient to speak for herself. On two separate occasions, the mother said "she shuts down and won't talk when she's alone" and on another occasion said "she has trouble answering  questions because she spaces out a lot". After the mother was asked to leave the room, the patient was quite interactive, answered questions appropriately, was honest, and made eye contact.  Lastly, the patient's tinea capitis is still present and pruritic. The patient has been taking Terbinafine 250 mg qd since she was seen in the ED on 6/10, therefore has only had 3 total doses. On exam, two large patches that appear mildly erythematous and flaky in nature. No tenderness.   Past Medical History  Diagnosis Date  . Foot deformity, acquired     little toes, toes nail are falling off, needs surgery, but cant happen till AIC < 9  . Fatty liver   . Diabetic neuropathy 01/08/2012  . Depression 12/25/2011  . Diabetes type 1, uncontrolled 08/27/1997  . Hyperlipidemia LDL goal < 100 12/19/2011  . Superficial skin infection 12/18/2011    Recurrent for past 2 years   . Insomnia 01/30/2012  . Chronic pain of right knee 02/2013    followed by Orthopedist Dr. Eulah Pont   Current Outpatient Prescriptions  Medication Sig Dispense Refill  . esomeprazole (NEXIUM) 40 MG capsule Take 40 mg by mouth daily.       . insulin aspart (NOVOLOG) 100 UNIT/ML injection Inject 5 Units into the skin 3 (three) times daily before meals. Novolog TID. The goal of CBG is 150. Patient is to take additional 1 unit with every 50 of cbgs above 150. She is to take additional 1 unit for every 3 CHO per patient report( sees endocrinologist Dr.Smith).      . insulin glargine (LANTUS) 100  UNIT/ML injection Inject 50 Units into the skin at bedtime.       . medroxyPROGESTERone (DEPO-PROVERA) 150 MG/ML injection Inject 150 mg into the muscle every 3 (three) months.      . neomycin-bacitracin-polymyxin (NEOSPORIN) ointment Apply 1 application topically at bedtime. Apply to affected areas on arms      . pregabalin (LYRICA) 50 MG capsule Take 50 mg by mouth 2 (two) times daily.      Marland Kitchen. terbinafine (LAMISIL) 250 MG tablet Take 1 tablet (250 mg total) by  mouth daily. For the next 4 weeks  30 tablet  0  . venlafaxine XR (EFFEXOR-XR) 150 MG 24 hr capsule Take 1 capsule (150 mg total) by mouth daily.  30 capsule  2   No current facility-administered medications for this visit.   Family History  Problem Relation Age of Onset  . Diabetes Maternal Grandfather   . Depression Mother    History   Social History  . Marital Status: Single    Spouse Name: N/A    Number of Children: N/A  . Years of Education: N/A   Social History Main Topics  . Smoking status: Never Smoker   . Smokeless tobacco: None  . Alcohol Use: No  . Drug Use: No  . Sexual Activity: None   Other Topics Concern  . None   Social History Narrative  . None    Review of Systems: General: Positive for appetite change and fatigue. Denies fever, chills, diaphoresis.  Respiratory: Denies SOB, DOE, cough, chest tightness, and wheezing.   Cardiovascular: Denies chest pain and palpitations.  Gastrointestinal: Denies nausea, vomiting, abdominal pain, diarrhea, constipation, blood in stool and abdominal distention.  Genitourinary: Denies dysuria, urgency, frequency, hematuria, and flank pain. Endocrine: Denies hot or cold intolerance, polyuria, and polydipsia. Musculoskeletal: Positive for right knee pain. Denies myalgias, back pain, joint swelling, and gait problem.  Skin: Positive for scalp rash and old skin wounds. Denies pallor.  Neurological: Denies dizziness, seizures, syncope, weakness, lightheadedness, numbness and headaches.  Psychiatric/Behavioral: Positive for depressed mood, nervousness, sleep disturbance, and mild agitation. Denies confusion.   Objective:   Physical Exam: Filed Vitals:   02/05/14 1006  BP: 122/82  Pulse: 118  Temp: 98.4 F (36.9 C)  TempSrc: Oral  Height: 5\' 3"  (1.6 m)  Weight: 146 lb 12.8 oz (66.588 kg)  SpO2: 100%   General: Vital signs reviewed.  Patient is a well-developed and well-nourished, in no acute distress and cooperative  with exam.  Head: Normocephalic and atraumatic. Two scaly, mildly erythematous patches on the head. No tenderness.  Eyes: PERRL, EOMI, conjunctivae normal, No scleral icterus.  Neck: Supple, trachea midline, normal ROM, No JVD, masses, thyromegaly, or carotid bruit present.  Cardiovascular: Tachycardic, S1 normal, S2 normal, no murmurs, gallops, or rubs. Pulmonary/Chest: Air entry equal bilaterally, no wheezes, rales, or rhonchi. Abdominal: Soft, non-tender, non-distended, BS +, no masses, organomegaly, or guarding present.  Musculoskeletal: No joint deformities, erythema, or stiffness, ROM full. Mild tenderness in the right knee joint.  Extremities: No swelling or edema, pulses symmetric and intact bilaterally. No cyanosis or clubbing. Neurological: A&O x3, Strength is normal and symmetric bilaterally, cranial nerve II-XII are grossly intact, no focal motor deficit, sensory intact to light touch bilaterally.  Skin: Warm, dry and intact. Several hyperpigmented lesions on arms and legs (chronic lesions according to the patient). Psychiatric: Depressed mood, flat affect. Speech and behavior is normal. Cognition and memory are normal.   Assessment & Plan:   Please see problem  based assessment and plan.

## 2014-02-05 NOTE — Patient Instructions (Signed)
General Instructions:  1. Please schedule a follow up appointment for 1 month.   Please see Dr. Katrinka BlazingSmith today!  [facility] if [facility] today (or Monday)  2. Please take all medications as prescribed. Continue Terbenafine until course is completed.  3. If you have worsening of your symptoms or new symptoms arise, please call the clinic [phone]), or go to the ER immediately if symptoms are severe.  You have done a great job in taking all your medications. I appreciate it very much. Please continue doing that.   Please bring your medicines with you each time you come to clinic.  Medicines may include prescription medications, over-the-counter medications, herbal remedies, eye drops, vitamins, or other pills.   Progress Toward Treatment Goals:  Treatment Goal 02/05/2014  Hemoglobin A1C deteriorated  Prevent falls at goal    Self Care Goals & Plans:  Self Care Goal 02/05/2014  Manage my medications take my medicines as prescribed; bring my medications to every visit; refill my medications on time  Monitor my health keep track of my blood glucose; bring my glucose meter and log to each visit  Eat healthy foods drink diet soda or water instead of juice or soda; eat more vegetables; eat foods that are low in salt; eat baked foods instead of fried foods; eat fruit for snacks and desserts  Be physically active find an activity I enjoy; find time in my schedule    Home Blood Glucose Monitoring 02/05/2014  Check my blood sugar 3 times a day  When to check my blood sugar before meals     Care Management & Community Referrals:  Referral 02/05/2014  Referrals made for care management support diabetes educator  Referrals made to community resources none

## 2014-02-06 LAB — LIPID PANEL
CHOLESTEROL: 229 mg/dL — AB (ref 0–200)
HDL: 61 mg/dL (ref >39)
LDL Cholesterol: 139 mg/dL — ABNORMAL HIGH (ref 0–99)
TRIGLYCERIDES: 145 mg/dL (ref <150)
Total CHOL/HDL Ratio: 3.8 Ratio
VLDL: 29 mg/dL (ref 0–40)

## 2014-02-08 NOTE — Progress Notes (Signed)
Case discussed with Dr. Jones at the time of the visit.  We reviewed the resident's history and exam and pertinent patient test results.  I agree with the assessment, diagnosis, and plan of care documented in the resident's note. 

## 2014-02-09 ENCOUNTER — Ambulatory Visit (INDEPENDENT_AMBULATORY_CARE_PROVIDER_SITE_OTHER): Payer: No Typology Code available for payment source | Admitting: Psychiatry

## 2014-02-09 ENCOUNTER — Encounter (HOSPITAL_COMMUNITY): Payer: Self-pay | Admitting: Psychiatry

## 2014-02-09 VITALS — BP 137/84 | HR 122 | Wt 143.0 lb

## 2014-02-09 DIAGNOSIS — F329 Major depressive disorder, single episode, unspecified: Secondary | ICD-10-CM

## 2014-02-09 DIAGNOSIS — F339 Major depressive disorder, recurrent, unspecified: Secondary | ICD-10-CM

## 2014-02-09 MED ORDER — LORAZEPAM 0.5 MG PO TABS: 0.5000 mg | ORAL_TABLET | Freq: Two times a day (BID) | ORAL | Status: DC | PRN

## 2014-02-09 MED ORDER — SERTRALINE HCL 50 MG PO TABS: ORAL_TABLET | ORAL | Status: DC

## 2014-02-09 NOTE — Progress Notes (Signed)
[facility] [phone] Progress Note  Christine Dunn [id number] 23 y.o.  02/09/2014 4:57 PM  Chief Complaint:  Medication management and followup.  History of Present Illness:  Christine Dunn came earlier than her scheduled appointment .  She's been feeling more depressed anxious and isolated.  She described that she has limited desire to do anything.  She admitted to crying spells lack of motivation.  She reported poor sleep .  She has been unable to see her therapist because her therapist is on maternity leave .  She is compliant with Effexor and denies any side effects.  She denies any active or passive suicidal thoughts but admitted feelings of hopelessness and worthlessness.  She denies any significant panic attack in recent weeks but endorsed increased anxiety and lack of energy.  Her energy level is poor.  She is not taking anything for sleep.  She denies any paranoia or any hallucination.  She is not drinking or using any illegal substances.  She is going to [facility] regularly.  She is checking her blood sugar and denies any recent worsening of blood sugar.  She was recently seen by her primary care physician.  She has blood work.  Her hemoglobin A1c is 7.3.  Suicidal Ideation: No Plan Formed: No Patient has means to carry out plan: No  Homicidal Ideation: No Plan Formed: No Patient has means to carry out plan: No  Medical History; Patient has diabetes mellitus, neuropathy, hyperlipidemia.  Her primary care physician is Dr. Truman Hayward and she is seeing Dr. Tamala Julian for the management of diabetes at [facility].  Psychosocial History; Patient was born and raised in [place].  Her parents are divorced.  She lives with her mother.  She has an older sister and a brother.  Patient has a good relationship with her boyfriend was very supportive.  Patient has no children.  Past Psychiatric History/Hospitalization(s) Patient denies any history of suicidal attempt, inpatient psychiatric treatment,  mania, psychosis or any hallucination.  She was seen by Dr. Dwyane Dee for depression a few years ago and prescribed Zoloft until 2 years ago it stopped working.  She endorses history of scratching herself causing injury to her skin. She denies any history of mood swing, aggression, violence or any impulsive behavior. In the past she has taken trazodone but did not work. Anxiety: No Bipolar Disorder: No Depression: No Mania: No Psychosis: No Schizophrenia: No Personality Disorder: No Hospitalization for psychiatric illness: No History of Electroconvulsive Shock Therapy: No Prior Suicide Attempts: No   Review of Systems: Psychiatric: Agitation: No Hallucination: No Depressed Mood: Yes Insomnia: Yes Hypersomnia: No Altered Concentration: No Feels Worthless: Yes Grandiose Ideas: No Belief In Special Powers: No New/Increased Substance Abuse: No Compulsions: No  Neurologic: Headache: No Seizure: No Paresthesias: Yes    Outpatient Encounter Prescriptions as of 02/09/2014  Medication Sig  . esomeprazole (NEXIUM) 40 MG capsule Take 40 mg by mouth daily.   . insulin aspart (NOVOLOG) 100 UNIT/ML injection Inject 5 Units into the skin 3 (three) times daily before meals. Novolog TID. The goal of CBG is 150. Patient is to take additional 1 unit with every 50 of cbgs above 150. She is to take additional 1 unit for every 3 CHO per patient report( sees endocrinologist Dr.Smith).  . insulin glargine (LANTUS) 100 UNIT/ML injection Inject 50 Units into the skin at bedtime.   . medroxyPROGESTERone (DEPO-PROVERA) 150 MG/ML injection Inject 150 mg into the muscle every 3 (three) months.  . pregabalin (LYRICA) 50 MG capsule  Take 50 mg by mouth 2 (two) times daily.  Marland Kitchen terbinafine (LAMISIL) 250 MG tablet Take 1 tablet (250 mg total) by mouth daily. For the next 4 weeks  . venlafaxine XR (EFFEXOR-XR) 150 MG 24 hr capsule Take 1 capsule (150 mg total) by mouth daily.  Marland Kitchen LORazepam (ATIVAN) 0.5 MG tablet  Take 1 tablet (0.5 mg total) by mouth 2 (two) times daily as needed for anxiety.  Marland Kitchen neomycin-bacitracin-polymyxin (NEOSPORIN) ointment Apply 1 application topically at bedtime. Apply to affected areas on arms  . sertraline (ZOLOFT) 50 MG tablet Take 1/2 tab daily for 1 week and than full tab daily    Recent Results (from the past 2160 hour(s))  CBG MONITORING, ED     Status: Abnormal   Collection Time    01/09/14  4:30 PM      Result Value Ref Range   Glucose-Capillary 140 (*) 70 - 99 mg/dL  CBC     Status: Abnormal   Collection Time    01/09/14  4:50 PM      Result Value Ref Range   WBC 14.8 (*) 4.0 - 10.5 K/uL   RBC 5.31 (*) 3.87 - 5.11 MIL/uL   Hemoglobin 13.8  12.0 - 15.0 g/dL   HCT 42.5  36.0 - 46.0 %   MCV 80.0  78.0 - 100.0 fL   MCH 26.0  26.0 - 34.0 pg   MCHC 32.5  30.0 - 36.0 g/dL   RDW 15.3  11.5 - 15.5 %   Platelets 298  150 - 400 K/uL  COMPREHENSIVE METABOLIC PANEL     Status: Abnormal   Collection Time    01/09/14  4:50 PM      Result Value Ref Range   Sodium 142  137 - 147 mEq/L   Potassium 4.7  3.7 - 5.3 mEq/L   Chloride 104  96 - 112 mEq/L   CO2 22  19 - 32 mEq/L   Glucose, Bld 141 (*) 70 - 99 mg/dL   BUN 7  6 - 23 mg/dL   Creatinine, Ser 0.51  0.50 - 1.10 mg/dL   Calcium 9.5  8.4 - 10.5 mg/dL   Total Protein 7.4  6.0 - 8.3 g/dL   Albumin 3.4 (*) 3.5 - 5.2 g/dL   AST 19  0 - 37 U/L   ALT 14  0 - 35 U/L   Alkaline Phosphatase 83  39 - 117 U/L   Total Bilirubin 0.3  0.3 - 1.2 mg/dL   GFR calc non Af Amer >90  >90 mL/min   GFR calc Af Amer >90  >90 mL/min   Comment: (NOTE)     The eGFR has been calculated using the CKD EPI equation.     This calculation has not been validated in all clinical situations.     eGFR's persistently <90 mL/min signify possible Chronic Kidney     Disease.  CBG MONITORING, ED     Status: Abnormal   Collection Time    01/09/14  5:30 PM      Result Value Ref Range   Glucose-Capillary 109 (*) 70 - 99 mg/dL  URINALYSIS, ROUTINE  W REFLEX MICROSCOPIC     Status: Abnormal   Collection Time    01/09/14  5:43 PM      Result Value Ref Range   Color, Urine YELLOW  YELLOW   APPearance CLOUDY (*) CLEAR   Specific Gravity, Urine 1.022  1.005 - 1.030   pH 6.5  5.0 -  8.0   Glucose, UA 250 (*) NEGATIVE mg/dL   Hgb urine dipstick NEGATIVE  NEGATIVE   Bilirubin Urine NEGATIVE  NEGATIVE   Ketones, ur >80 (*) NEGATIVE mg/dL   Protein, ur NEGATIVE  NEGATIVE mg/dL   Urobilinogen, UA 1.0  0.0 - 1.0 mg/dL   Nitrite POSITIVE (*) NEGATIVE   Leukocytes, UA LARGE (*) NEGATIVE  URINE MICROSCOPIC-ADD ON     Status: Abnormal   Collection Time    01/09/14  5:43 PM      Result Value Ref Range   Squamous Epithelial / LPF MANY (*) RARE   WBC, UA 21-50  <3 WBC/hpf   Bacteria, UA MANY (*) RARE  URINE CULTURE     Status: None   Collection Time    01/09/14  5:43 PM      Result Value Ref Range   Specimen Description URINE, CLEAN CATCH     Special Requests ADDED 1945     Culture  Setup Time       Value: 01/10/2014 03:32     Performed at [facility]       Value: >=100,000 COLONIES/ML     Performed at [facility]   Culture       Value: STAPHYLOCOCCUS SPECIES (COAGULASE NEGATIVE)     Note: RIFAMPIN AND GENTAMICIN SHOULD NOT BE USED AS SINGLE DRUGS FOR TREATMENT OF STAPH INFECTIONS.     Performed at [facility]   Report Status 01/12/2014 FINAL     Organism ID, Bacteria STAPHYLOCOCCUS SPECIES (COAGULASE NEGATIVE)    CBG MONITORING, ED     Status: Abnormal   Collection Time    01/09/14  7:18 PM      Result Value Ref Range   Glucose-Capillary 228 (*) 70 - 99 mg/dL  GLUCOSE, CAPILLARY     Status: Abnormal   Collection Time    01/13/14  3:39 PM      Result Value Ref Range   Glucose-Capillary 206 (*) 70 - 99 mg/dL  POCT GLYCOSYLATED HEMOGLOBIN (HGB A1C)     Status: None   Collection Time    01/13/14  3:52 PM      Result Value Ref Range   Hemoglobin A1C 7.3    CBG MONITORING, ED     Status:  Abnormal   Collection Time    02/03/14 12:42 PM      Result Value Ref Range   Glucose-Capillary 116 (*) 70 - 99 mg/dL   Comment 1 Notify RN     Comment 2 Documented in Chart    URINALYSIS, ROUTINE W REFLEX MICROSCOPIC     Status: Abnormal   Collection Time    02/03/14  1:01 PM      Result Value Ref Range   Color, Urine YELLOW  YELLOW   APPearance HAZY (*) CLEAR   Specific Gravity, Urine 1.027  1.005 - 1.030   pH 6.0  5.0 - 8.0   Glucose, UA >1000 (*) NEGATIVE mg/dL   Hgb urine dipstick NEGATIVE  NEGATIVE   Bilirubin Urine NEGATIVE  NEGATIVE   Ketones, ur 40 (*) NEGATIVE mg/dL   Protein, ur NEGATIVE  NEGATIVE mg/dL   Urobilinogen, UA 0.2  0.0 - 1.0 mg/dL   Nitrite NEGATIVE  NEGATIVE   Leukocytes, UA NEGATIVE  NEGATIVE  URINE MICROSCOPIC-ADD ON     Status: Abnormal   Collection Time    02/03/14  1:01 PM      Result Value Ref Range  Squamous Epithelial / LPF FEW (*) RARE   WBC, UA 0-2  <3 WBC/hpf   RBC / HPF 0-2  <3 RBC/hpf   Bacteria, UA FEW (*) RARE  CBC WITH DIFFERENTIAL     Status: Abnormal   Collection Time    02/03/14  1:05 PM      Result Value Ref Range   WBC 10.6 (*) 4.0 - 10.5 K/uL   RBC 5.42 (*) 3.87 - 5.11 MIL/uL   Hemoglobin 14.9  12.0 - 15.0 g/dL   HCT 44.0  36.0 - 46.0 %   MCV 81.2  78.0 - 100.0 fL   MCH 27.5  26.0 - 34.0 pg   MCHC 33.9  30.0 - 36.0 g/dL   RDW 14.9  11.5 - 15.5 %   Platelets 319  150 - 400 K/uL   Neutrophils Relative % 61  43 - 77 %   Neutro Abs 6.5  1.7 - 7.7 K/uL   Lymphocytes Relative 32  12 - 46 %   Lymphs Abs 3.3  0.7 - 4.0 K/uL   Monocytes Relative 7  3 - 12 %   Monocytes Absolute 0.8  0.1 - 1.0 K/uL   Eosinophils Relative 0  0 - 5 %   Eosinophils Absolute 0.0  0.0 - 0.7 K/uL   Basophils Relative 0  0 - 1 %   Basophils Absolute 0.0  0.0 - 0.1 K/uL  COMPREHENSIVE METABOLIC PANEL     Status: Abnormal   Collection Time    02/03/14  1:05 PM      Result Value Ref Range   Sodium 140  137 - 147 mEq/L   Potassium 3.9  3.7 - 5.3  mEq/L   Chloride 98  96 - 112 mEq/L   CO2 24  19 - 32 mEq/L   Glucose, Bld 95  70 - 99 mg/dL   BUN 10  6 - 23 mg/dL   Creatinine, Ser 0.58  0.50 - 1.10 mg/dL   Calcium 10.0  8.4 - 10.5 mg/dL   Total Protein 8.4 (*) 6.0 - 8.3 g/dL   Albumin 4.0  3.5 - 5.2 g/dL   AST 15  0 - 37 U/L   ALT 12  0 - 35 U/L   Alkaline Phosphatase 93  39 - 117 U/L   Total Bilirubin 0.5  0.3 - 1.2 mg/dL   GFR calc non Af Amer >90  >90 mL/min   GFR calc Af Amer >90  >90 mL/min   Comment: (NOTE)     The eGFR has been calculated using the CKD EPI equation.     This calculation has not been validated in all clinical situations.     eGFR's persistently <90 mL/min signify possible Chronic Kidney     Disease.  POC URINE PREG, ED     Status: None   Collection Time    02/03/14  1:24 PM      Result Value Ref Range   Preg Test, Ur NEGATIVE  NEGATIVE   Comment:            THE SENSITIVITY OF THIS     METHODOLOGY IS >24 mIU/mL  CBG MONITORING, ED     Status: Abnormal   Collection Time    02/03/14  2:55 PM      Result Value Ref Range   Glucose-Capillary 50 (*) 70 - 99 mg/dL  CBG MONITORING, ED     Status: Abnormal   Collection Time    02/03/14  3:58 PM  Result Value Ref Range   Glucose-Capillary 129 (*) 70 - 99 mg/dL   Comment 1 Notify RN     Comment 2 Documented in Chart    GLUCOSE, CAPILLARY     Status: Abnormal   Collection Time    02/05/14 10:36 AM      Result Value Ref Range   Glucose-Capillary 147 (*) 70 - 99 mg/dL  LIPID PANEL     Status: Abnormal   Collection Time    02/05/14 11:24 AM      Result Value Ref Range   Cholesterol 229 (*) 0 - 200 mg/dL   Comment: ATP III Classification:           < 200        mg/dL        Desirable          200 - 239     mg/dL        Borderline High          >= 240        mg/dL        High         Triglycerides 145  <150 mg/dL   HDL 61  >39 mg/dL   Total CHOL/HDL Ratio 3.8     VLDL 29  0 - 40 mg/dL   LDL Cholesterol 139 (*) 0 - 99 mg/dL   Comment:        Total Cholesterol/HDL Ratio:CHD Risk                            Coronary Heart Disease Risk Table                                            Men       Women              1/2 Average Risk              3.4        3.3                  Average Risk              5.0        4.4               2X Average Risk              9.6        7.1               3X Average Risk             23.4       11.0     Use the calculated Patient Ratio above and the CHD Risk table      to determine the patient's CHD Risk.     ATP III Classification (LDL):           < 100        mg/dL         Optimal          100 - 129     mg/dL         Near or Above Optimal          130 - 159     mg/dL  Borderline High          160 - 189     mg/dL         High           > 190        mg/dL         Very High            Physical Exam: Constitutional:  BP 137/84  Pulse 122  Wt 143 lb (64.864 kg)  Musculoskeletal: Strength & Muscle Tone: within normal limits Gait & Station: normal Patient leans: N/A  Mental Status Examination;  Patient is a young female who is well groomed and well dressed. She appears to be in stated age. She maintained fair eye contact. She describes her mood depressed and anxious and her affect is constricted.  She denies any active or passive suicidal partial homicidal thought. Her fund of knowledge is adequate. There were no tremors or shakes. She denies any hallucination, paranoia or any delusions. There were no flight of ideas or any loose association. Her thought processes is logical and goal directed. Her speech is clear and coherent. She is alert and oriented x3. Her insight judgment and impulse control is okay.    Established Problem, Stable/Improving (1), Review or order clinical lab tests (1), Decision to obtain old records (1), Established Problem, Worsening (2), Review of Last Therapy Session (1), Review of Medication Regimen & Side Effects (2) and Review of New Medication or Change in Dosage  (2)  Assessment: Axis I: Maj. depressive disorder, recurrent  Axis II: Deferred  Axis III:  Past Medical History  Diagnosis Date  . Foot deformity, acquired     little toes, toes nail are falling off, needs surgery, but cant happen till AIC < 9  . Fatty liver   . Diabetic neuropathy 01/08/2012  . Depression 12/25/2011  . Diabetes type 1, uncontrolled 08/27/1997  . Hyperlipidemia LDL goal < 100 12/19/2011  . Superficial skin infection 12/18/2011    Recurrent for past 2 years   . Insomnia 01/30/2012  . Chronic pain of right knee 02/2013    followed by Orthopedist Dr. Percell Miller    Axis IV: Mild to moderate   Plan:  Patient is slowly decompensating.  I will add Ativan 0.5 mg twice a day for severe anxiety and panic attack.  In the past he had a good response to Zoloft.  I will add Zoloft 50 mg and she was taught 25 mg for one week and then gradually increased to 50 mg.  She will continue Effexor 150 mg daily.  Recommended to call us back if she has any question or any concern that if she postmenopausal symptoms.  I also reviewed records from his primary care physician and recent blood work results.  I will see her again in 3 weeks.  Time spent 25 minutes.  More than 50% of the time spent in psychoeducation, counseling and coordination of care.  Discuss safety plan that anytime having active suicidal thoughts or homicidal thoughts then patient need to call 911 or go to the local emergency room.    ARFEEN,SYED T., MD 02/09/2014

## 2014-02-19 ENCOUNTER — Encounter: Payer: Self-pay | Admitting: Internal Medicine

## 2014-02-19 ENCOUNTER — Ambulatory Visit (INDEPENDENT_AMBULATORY_CARE_PROVIDER_SITE_OTHER): Payer: No Typology Code available for payment source | Admitting: Internal Medicine

## 2014-02-19 VITALS — BP 111/78 | HR 108 | Temp 97.5°F | Ht 63.0 in | Wt 143.8 lb

## 2014-02-19 DIAGNOSIS — Z794 Long term (current) use of insulin: Secondary | ICD-10-CM

## 2014-02-19 DIAGNOSIS — L219 Seborrheic dermatitis, unspecified: Secondary | ICD-10-CM

## 2014-02-19 DIAGNOSIS — L281 Prurigo nodularis: Secondary | ICD-10-CM

## 2014-02-19 DIAGNOSIS — E1142 Type 2 diabetes mellitus with diabetic polyneuropathy: Secondary | ICD-10-CM

## 2014-02-19 DIAGNOSIS — E1049 Type 1 diabetes mellitus with other diabetic neurological complication: Secondary | ICD-10-CM

## 2014-02-19 DIAGNOSIS — E785 Hyperlipidemia, unspecified: Secondary | ICD-10-CM

## 2014-02-19 DIAGNOSIS — F339 Major depressive disorder, recurrent, unspecified: Secondary | ICD-10-CM

## 2014-02-19 DIAGNOSIS — L218 Other seborrheic dermatitis: Secondary | ICD-10-CM

## 2014-02-19 DIAGNOSIS — E103299 Type 1 diabetes mellitus with mild nonproliferative diabetic retinopathy without macular edema, unspecified eye: Secondary | ICD-10-CM

## 2014-02-19 DIAGNOSIS — E11329 Type 2 diabetes mellitus with mild nonproliferative diabetic retinopathy without macular edema: Secondary | ICD-10-CM

## 2014-02-19 DIAGNOSIS — L280 Lichen simplex chronicus: Secondary | ICD-10-CM

## 2014-02-19 DIAGNOSIS — L309 Dermatitis, unspecified: Secondary | ICD-10-CM

## 2014-02-19 DIAGNOSIS — E1039 Type 1 diabetes mellitus with other diabetic ophthalmic complication: Secondary | ICD-10-CM

## 2014-02-19 LAB — GLUCOSE, CAPILLARY: GLUCOSE-CAPILLARY: 133 mg/dL — AB (ref 70–99)

## 2014-02-19 MED ORDER — HYDROCORTISONE 1 % EX CREA: TOPICAL_CREAM | CUTANEOUS | Status: AC

## 2014-02-19 NOTE — Progress Notes (Signed)
Patient ID: Christine Dunn, female   DOB: 1991/08/21, 23 y.o.   MRN: [id number]    Subjective:   Patient ID: Christine Dunn female   DOB: 1991/08/21 23 y.o.   MRN: [id number]  HPI: Christine Dunn is a 23 y.o. woman with past medical history of Type I DM complicated by retinopathy and neuroapthy, hyperlipemia , depression, and chronic upper/lower extremity skin lesions since 2010.   She has history of multiple skin erosions and sores on the arms and legs since 2010. She has had 2 biopsies done for pathology and DIF. DIF was negative, and pathology showed spongiotic dermatitis only and was most [facility] dermatology on 12/05/12 where she was diagnosed with prurigo nodularis and told to apply OTC  cream/moisturizer. At that time she did not have any lesions on her scalp.   She states that about 4 weeks ago she began to have multiple nickel to dime sized areas of redness, scaliness, tenderness, and mild pruritis without hair loss on her scalp. She was seen in the ED on 6/10 after which she was diagnosed with tinea capitis and put her on oral terbinafine 250 mg daily for 4 weeks which she reports compliance with and has about 4 pills remaining. Since taking the medication, she believes the lesions are worse and have grown in size and now are more painful and "raw." She has also been using baby shampoo, selsun blue, and head and shoulders shampoo without relief. She denies fever, chills, oily skin, blisters, or discharge from the lesions.   She was recently seen by psychiatry on 6/16 and started on Ativan 0.5 BID as needed and zoloft 25 mg (to increase after 1 week). She continues to take venlafaxine 150 mg daily. She reports her mood is not any better and continues to be depressed on most days with decreased energy, insomnia, and decreased appetite. She denies suicide ideation.   Her last A1c was 7.3 on 01/13/14 and is compliant with taking Lantus 50 U and Novolog SSI 5U TID and 1  unit per 3 carbs. She frequently monitors her glucose at home with average in the 100's but occasionally has symptomatic hypoglycemia (in 60's). She has chronic blurry vision, polydipsia, polyuria, and neuropathy (in her toes) and is compliant with taking lyrica. She denies polyphagia or foot injury/infection. She has decreased appetite and recent weight loss. She tries to stay physically active.          Past Medical History  Diagnosis Date  . Foot deformity, acquired     little toes, toes nail are falling off, needs surgery, but cant happen till AIC < 9  . Fatty liver   . Diabetic neuropathy 01/08/2012  . Depression 12/25/2011  . Diabetes type 1, uncontrolled 08/27/1997  . Hyperlipidemia LDL goal < 100 12/19/2011  . Superficial skin infection 12/18/2011    Recurrent for past 2 years   . Insomnia 01/30/2012  . Chronic pain of right knee 02/2013    followed by Orthopedist Dr. Eulah PontMurphy   Current Outpatient Prescriptions  Medication Sig Dispense Refill  . esomeprazole (NEXIUM) 40 MG capsule Take 40 mg by mouth daily.       . insulin aspart (NOVOLOG) 100 UNIT/ML injection Inject 5 Units into the skin 3 (three) times daily before meals. Novolog TID. The goal of CBG is 150. Patient is to take additional 1 unit with every 50 of cbgs above 150. She is to take additional 1 unit for every 3  CHO per patient report( sees endocrinologist Dr.Smith).      . insulin glargine (LANTUS) 100 UNIT/ML injection Inject 50 Units into the skin at bedtime.       Marland Kitchen. LORazepam (ATIVAN) 0.5 MG tablet Take 1 tablet (0.5 mg total) by mouth 2 (two) times daily as needed for anxiety.  30 tablet  0  . medroxyPROGESTERone (DEPO-PROVERA) 150 MG/ML injection Inject 150 mg into the muscle every 3 (three) months.      . neomycin-bacitracin-polymyxin (NEOSPORIN) ointment Apply 1 application topically at bedtime. Apply to affected areas on arms      . pregabalin (LYRICA) 50 MG capsule Take 50 mg by mouth 2 (two) times daily.      .  sertraline (ZOLOFT) 50 MG tablet Take 1/2 tab daily for 1 week and than full tab daily  30 tablet  0  . terbinafine (LAMISIL) 250 MG tablet Take 1 tablet (250 mg total) by mouth daily. For the next 4 weeks  30 tablet  0  . venlafaxine XR (EFFEXOR-XR) 150 MG 24 hr capsule Take 1 capsule (150 mg total) by mouth daily.  30 capsule  2   No current facility-administered medications for this visit.   Family History  Problem Relation Age of Onset  . Diabetes Maternal Grandfather   . Depression Mother    History   Social History  . Marital Status: Single    Spouse Name: N/A    Number of Children: N/A  . Years of Education: N/A   Social History Main Topics  . Smoking status: Never Smoker   . Smokeless tobacco: Not on file  . Alcohol Use: No  . Drug Use: No  . Sexual Activity: Not on file   Other Topics Concern  . Not on file   Social History Narrative  . No narrative on file   Review of Systems: Review of Systems  Constitutional: Positive for weight loss and malaise/fatigue. Negative for fever, chills and diaphoresis.  HENT: Negative for congestion and sore throat.   Eyes: Positive for blurred vision (chronic at baseline).  Respiratory: Negative for cough, shortness of breath and wheezing.   Cardiovascular: Negative for chest pain, palpitations and leg swelling.  Gastrointestinal: Negative for nausea, vomiting, abdominal pain, diarrhea, constipation and blood in stool.  Genitourinary: Negative for dysuria, urgency, frequency and hematuria.  Musculoskeletal: Negative for joint pain and myalgias.  Skin: Positive for itching and rash (on scalp, b/l arms and legs).  Neurological: Positive for sensory change (chronic in toes). Negative for dizziness, weakness and headaches.  Endo/Heme/Allergies: Positive for polydipsia (chronic at baseline). Does not bruise/bleed easily.  Psychiatric/Behavioral: Positive for depression. Negative for suicidal ideas. The patient is nervous/anxious and  has insomnia.      Objective:  Physical Exam: Filed Vitals:   02/19/14 1325  BP: 111/78  Pulse: 108  Temp: 97.5 F (36.4 C)  TempSrc: Oral  Height: 5\' 3"  (1.6 m)  Weight: 143 lb 12.8 oz (65.227 kg)  SpO2: 98%    Physical Exam  Constitutional: She is oriented to person, place, and time. She appears well-developed and well-nourished. No distress.  Pt itching upper and lower extremity lesions throughout the interview   HENT:  Head: Normocephalic and atraumatic.  Right Ear: External ear normal.  Left Ear: External ear normal.  Nose: Nose normal.  Mouth/Throat: Oropharynx is clear and moist. No oropharyngeal exudate.  Eyes: Conjunctivae and EOM are normal. Pupils are equal, round, and reactive to light. Right eye exhibits no discharge. Left  eye exhibits no discharge. No scleral icterus.  Neck: Normal range of motion. Neck supple.  Cardiovascular: Regular rhythm and normal heart sounds.   Tachycardic   Pulmonary/Chest: Effort normal and breath sounds normal. No respiratory distress. She has no wheezes. She has no rales.  Abdominal: Soft. Bowel sounds are normal. She exhibits no distension. There is no tenderness. There is no rebound and no guarding.  Musculoskeletal: Normal range of motion. She exhibits no edema and no tenderness.  Neurological: She is alert and oriented to person, place, and time.  Skin: Skin is warm and dry. Rash (Multiple areas of tender, erythematous, scaly patches on scalp and posterior neck without bleeding or hair loss) noted. She is not diaphoretic. No pallor.  Erythematous papules/nodules with excoriations on b/l upper and lower extremities   Psychiatric: She has a normal mood and affect. Her behavior is normal. Judgment and thought content normal.    Assessment & Plan:   Please see problem list for problem-based assessment and plan

## 2014-02-19 NOTE — Patient Instructions (Addendum)
-  Start using neutrogena T gel shampoo daily for the next 2 weeks -Start using topical hydrocortisone 1% twice a day for the next 2 weeks -Will see you back in 2 weeks, nice meeting you!   General Instructions:   Please try to bring all your medicines next time. This will help us keep you safe from mistakes.   Progress Toward Treatment Goals:  Treatment Goal 02/05/2014  Hemoglobin A1C deteriorated  Prevent falls at goal    Self Care Goals & Plans:  Self Care Goal 02/05/2014  Manage my medications take my medicines as prescribed; bring my medications to every visit; refill my medications on time; follow the sick day instructions if I am sick  Monitor my health keep track of my blood glucose; bring my glucose meter and log to each visit  Eat healthy foods drink diet soda or water instead of juice or soda; eat more vegetables; eat foods that are low in salt; eat baked foods instead of fried foods; eat fruit for snacks and desserts  Be physically active find an activity I enjoy; find time in my schedule    Home Blood Glucose Monitoring 02/05/2014  Check my blood sugar 3 times a day  When to check my blood sugar before meals     Care Management & Community Referrals:  Referral 02/05/2014  Referrals made for care management support diabetes educator  Referrals made to community resources none

## 2014-02-20 DIAGNOSIS — L219 Seborrheic dermatitis, unspecified: Secondary | ICD-10-CM | POA: Insufficient documentation

## 2014-02-20 NOTE — Assessment & Plan Note (Signed)
Assessment: Pt with last lipid panel on 02/05/14 with hypercholesteremia not on statin thearpy with lifetime ASCVD risk of 39%.   Plan: -Consider starting moderate intensity statin at next visit  -Last CMP 02/03/14 was normal

## 2014-02-20 NOTE — Assessment & Plan Note (Addendum)
Assessment: Pt with major depressive disorder compliant with SNRI therapy and recently started SSRI and anxiolytic therapy who presents with no change in mood.   Plan:  -Continue Ativan 0.5 mg BID PRN -Continue sertraline 50 mg daily, to increase to 50 mg daily per Dr. Lolly MustacheArfeen -Continue venlafaxine 150 mg daily  -Pt to follow-up with Dr Lolly MustacheArfeen in 2 weeks

## 2014-02-20 NOTE — Assessment & Plan Note (Addendum)
Assessment: Pt with history of multiple skin lesions on b/l upper and lower extremities since 2010 with 2 biopsies revealing negative DIF and pathology with spongiotic dermatitis, most recently seen at [facility] on 12/05/12 and diagnosed with prurigo nodularis, who presents with no improvement in lesions.    Plan:  -Pt instructed to apply topical 1% hydrocortisone cream BID to extremity lesions for next 2 weeks, consider higher potency steroid cream at next visit    -Pt to return in 2 weeks to assess for improvement with current treatment

## 2014-02-20 NOTE — Assessment & Plan Note (Addendum)
Assessment: Pt with 7629-month history of scaly, painful, and erythematous patches on the scalp who presents with worsening of symptoms since starting anti-fungal treatment without superimposed infection.   Plan:  -Discontinue terbinafine 250 mg daily due to treatment failure -Pt instructed to apply Neutrogena T-gel shampoo daily for the next 2 weeks -Pt instructed to apply topical 1% hydrocortisone cream BID to scalp for next 2 weeks, consider higher potency steroid cream at next visit    -Pt to return in 2 weeks to assess for improvement with current treatment -Consider referral to dermatology

## 2014-02-20 NOTE — Assessment & Plan Note (Addendum)
Assessment: Pt with well-controlled Type 1 DM complicated by non-proliferative retinopathy and peripheral neuropathy with last A1c 7.3 on 01/13/14 compliant with insulin therapy with occasional symptomatic hypoglycemia who presents with CBG of 133.   Plan: -Last A1c 7.3 near goal <7, continue Lantus 50 U and Novolog SSI 5 U TID with carb counting, consider decreasing Lantus at next visit if hypoglycemia persists   -BP 111/78 at goal <140/90 -LDL 139 not at goal <100, consider initiating statin therapy at next visit -Last annual foot exam on 10/15/13 -Last annual eye exam on 02/13/13, obtain outside records from recent imaging  -Continue pregabalin 50 mg BID for peripheral neuropathy -BMI 25.48 near goal <25

## 2014-02-22 NOTE — Progress Notes (Signed)
I saw and evaluated the patient.  I personally confirmed the key portions of the history and exam documented by Dr. Johna Rolesabbani and I reviewed pertinent patient test results.  The assessment, diagnosis, and plan were formulated together and I agree with the documentation in the resident's note, with the following additional comments.  Plan is for patient to shampoo with T-gel shampoo daily, and apply hydrocortisone 1% cream twice a day to affected areas, with return in 2 weeks.

## 2014-02-24 ENCOUNTER — Ambulatory Visit (HOSPITAL_COMMUNITY): Payer: Self-pay | Admitting: Psychiatry

## 2014-02-25 ENCOUNTER — Ambulatory Visit (INDEPENDENT_AMBULATORY_CARE_PROVIDER_SITE_OTHER): Payer: No Typology Code available for payment source | Admitting: Psychiatry

## 2014-02-25 ENCOUNTER — Encounter (HOSPITAL_COMMUNITY): Payer: Self-pay | Admitting: Psychiatry

## 2014-02-25 VITALS — BP 114/85 | HR 120 | Ht 63.0 in | Wt 141.0 lb

## 2014-02-25 DIAGNOSIS — F339 Major depressive disorder, recurrent, unspecified: Secondary | ICD-10-CM

## 2014-02-25 DIAGNOSIS — F331 Major depressive disorder, recurrent, moderate: Secondary | ICD-10-CM

## 2014-02-25 MED ORDER — VENLAFAXINE HCL ER 75 MG PO CP24: 75.0000 mg | ORAL_CAPSULE | Freq: Every day | ORAL | Status: DC

## 2014-02-25 MED ORDER — LORAZEPAM 0.5 MG PO TABS: 0.5000 mg | ORAL_TABLET | Freq: Two times a day (BID) | ORAL | Status: DC | PRN

## 2014-02-25 MED ORDER — SERTRALINE HCL 100 MG PO TABS: 100.0000 mg | ORAL_TABLET | Freq: Every day | ORAL | Status: DC

## 2014-02-25 NOTE — Progress Notes (Signed)
[facility] [phone] Progress Note  Christine Dunn [id number] 23 y.o.  02/25/2014 10:54 AM  Chief Complaint:  Medication management and followup.  History of Present Illness:  Christine Dunn came for her appointment.  We started her on Zoloft on her last visit.  She is taking 50 mg Zoloft and Effexor 150 mg daily.  She has not seen a major improvement other than she has less crying spells and she is sleeping better.  She continues to have depressive thoughts , social isolation and decreased motivation to do things.  She is tolerating her medication.  She denies any side effects.  She has noticed irritability and frustration.  However she denied any suicidal thoughts or homicidal thoughts.  She is scheduled to see therapist on July sixth .  She will see Jarrett Soho .  Patient denies any paranoia or any hallucination.  She was to give more time to Zoloft.  She like the Ativan is helping her anxiety and nervousness.  She denies any active or passive suicidal thoughts but admitted feelings of hopelessness and worthlessness.  She denies any significant panic attack in recent weeks but endorsed increased anxiety and lack of energy.  Recently she seen her primary care physician .  She is not drinking or using any illegal substances.  She is going to [facility] regularly.   Suicidal Ideation: No Plan Formed: No Patient has means to carry out plan: No  Homicidal Ideation: No Plan Formed: No Patient has means to carry out plan: No  Medical History; Patient has diabetes mellitus, neuropathy, hyperlipidemia.  Her primary care physician is Dr. Truman Hayward and she is seeing Dr. Tamala Julian for the management of diabetes at [facility].  Psychosocial History; Patient was born and raised in [place].  Her parents are divorced.  She lives with her mother.  She has an older sister and a brother.  Patient has a good relationship with her boyfriend was very supportive.  Patient has no children.  Past Psychiatric  History/Hospitalization(s) Patient denies any history of suicidal attempt, inpatient psychiatric treatment, mania, psychosis or any hallucination.  She was seen by Dr. Dwyane Dee for depression a few years ago and prescribed Zoloft until 2 years ago it stopped working.  She endorses history of scratching herself causing injury to her skin. She denies any history of mood swing, aggression, violence or any impulsive behavior. In the past she has taken trazodone but did not work. Anxiety: No Bipolar Disorder: No Depression: No Mania: No Psychosis: No Schizophrenia: No Personality Disorder: No Hospitalization for psychiatric illness: No History of Electroconvulsive Shock Therapy: No Prior Suicide Attempts: No   Review of Systems: Psychiatric: Agitation: No Hallucination: No Depressed Mood: Yes Insomnia: Yes Hypersomnia: No Altered Concentration: No Feels Worthless: Yes Grandiose Ideas: No Belief In Special Powers: No New/Increased Substance Abuse: No Compulsions: No  Neurologic: Headache: No Seizure: No Paresthesias: Yes    Outpatient Encounter Prescriptions as of 02/25/2014  Medication Sig  . esomeprazole (NEXIUM) 40 MG capsule Take 40 mg by mouth daily.   . hydrocortisone cream 1 % Apply to affected area 2 times daily  . insulin aspart (NOVOLOG) 100 UNIT/ML injection Inject 5 Units into the skin 3 (three) times daily before meals. Novolog TID. The goal of CBG is 150. Patient is to take additional 1 unit with every 50 of cbgs above 150. She is to take additional 1 unit for every 3 CHO per patient report( sees endocrinologist Dr.Smith).  . insulin glargine (LANTUS) 100 UNIT/ML injection Inject 50  Units into the skin at bedtime.   Marland Kitchen LORazepam (ATIVAN) 0.5 MG tablet Take 1 tablet (0.5 mg total) by mouth 2 (two) times daily as needed for anxiety.  . medroxyPROGESTERone (DEPO-PROVERA) 150 MG/ML injection Inject 150 mg into the muscle every 3 (three) months.  .  neomycin-bacitracin-polymyxin (NEOSPORIN) ointment Apply 1 application topically at bedtime. Apply to affected areas on arms  . pregabalin (LYRICA) 50 MG capsule Take 50 mg by mouth 2 (two) times daily.  . sertraline (ZOLOFT) 100 MG tablet Take 1 tablet (100 mg total) by mouth daily.  Marland Kitchen venlafaxine XR (EFFEXOR-XR) 75 MG 24 hr capsule Take 1 capsule (75 mg total) by mouth daily.  . [DISCONTINUED] LORazepam (ATIVAN) 0.5 MG tablet Take 1 tablet (0.5 mg total) by mouth 2 (two) times daily as needed for anxiety.  . [DISCONTINUED] sertraline (ZOLOFT) 100 MG tablet Take 1 tablet (100 mg total) by mouth daily.  . [DISCONTINUED] sertraline (ZOLOFT) 50 MG tablet Take 1/2 tab daily for 1 week and than full tab daily  . [DISCONTINUED] venlafaxine XR (EFFEXOR-XR) 150 MG 24 hr capsule Take 1 capsule (150 mg total) by mouth daily.    Recent Results (from the past 2160 hour(s))  CBG MONITORING, ED     Status: Abnormal   Collection Time    01/09/14  4:30 PM      Result Value Ref Range   Glucose-Capillary 140 (*) 70 - 99 mg/dL  CBC     Status: Abnormal   Collection Time    01/09/14  4:50 PM      Result Value Ref Range   WBC 14.8 (*) 4.0 - 10.5 K/uL   RBC 5.31 (*) 3.87 - 5.11 MIL/uL   Hemoglobin 13.8  12.0 - 15.0 g/dL   HCT 42.5  36.0 - 46.0 %   MCV 80.0  78.0 - 100.0 fL   MCH 26.0  26.0 - 34.0 pg   MCHC 32.5  30.0 - 36.0 g/dL   RDW 15.3  11.5 - 15.5 %   Platelets 298  150 - 400 K/uL  COMPREHENSIVE METABOLIC PANEL     Status: Abnormal   Collection Time    01/09/14  4:50 PM      Result Value Ref Range   Sodium 142  137 - 147 mEq/L   Potassium 4.7  3.7 - 5.3 mEq/L   Chloride 104  96 - 112 mEq/L   CO2 22  19 - 32 mEq/L   Glucose, Bld 141 (*) 70 - 99 mg/dL   BUN 7  6 - 23 mg/dL   Creatinine, Ser 0.51  0.50 - 1.10 mg/dL   Calcium 9.5  8.4 - 10.5 mg/dL   Total Protein 7.4  6.0 - 8.3 g/dL   Albumin 3.4 (*) 3.5 - 5.2 g/dL   AST 19  0 - 37 U/L   ALT 14  0 - 35 U/L   Alkaline Phosphatase 83  39 - 117  U/L   Total Bilirubin 0.3  0.3 - 1.2 mg/dL   GFR calc non Af Amer >90  >90 mL/min   GFR calc Af Amer >90  >90 mL/min   Comment: (NOTE)     The eGFR has been calculated using the CKD EPI equation.     This calculation has not been validated in all clinical situations.     eGFR's persistently <90 mL/min signify possible Chronic Kidney     Disease.  CBG MONITORING, ED     Status: Abnormal   Collection  Time    01/09/14  5:30 PM      Result Value Ref Range   Glucose-Capillary 109 (*) 70 - 99 mg/dL  URINALYSIS, ROUTINE W REFLEX MICROSCOPIC     Status: Abnormal   Collection Time    01/09/14  5:43 PM      Result Value Ref Range   Color, Urine YELLOW  YELLOW   APPearance CLOUDY (*) CLEAR   Specific Gravity, Urine 1.022  1.005 - 1.030   pH 6.5  5.0 - 8.0   Glucose, UA 250 (*) NEGATIVE mg/dL   Hgb urine dipstick NEGATIVE  NEGATIVE   Bilirubin Urine NEGATIVE  NEGATIVE   Ketones, ur >80 (*) NEGATIVE mg/dL   Protein, ur NEGATIVE  NEGATIVE mg/dL   Urobilinogen, UA 1.0  0.0 - 1.0 mg/dL   Nitrite POSITIVE (*) NEGATIVE   Leukocytes, UA LARGE (*) NEGATIVE  URINE MICROSCOPIC-ADD ON     Status: Abnormal   Collection Time    01/09/14  5:43 PM      Result Value Ref Range   Squamous Epithelial / LPF MANY (*) RARE   WBC, UA 21-50  <3 WBC/hpf   Bacteria, UA MANY (*) RARE  URINE CULTURE     Status: None   Collection Time    01/09/14  5:43 PM      Result Value Ref Range   Specimen Description URINE, CLEAN CATCH     Special Requests ADDED 1945     Culture  Setup Time       Value: 01/10/2014 03:32     Performed at [facility]       Value: >=100,000 COLONIES/ML     Performed at [facility]   Culture       Value: STAPHYLOCOCCUS SPECIES (COAGULASE NEGATIVE)     Note: RIFAMPIN AND GENTAMICIN SHOULD NOT BE USED AS SINGLE DRUGS FOR TREATMENT OF STAPH INFECTIONS.     Performed at [facility]   Report Status 01/12/2014 FINAL     Organism ID, Bacteria  STAPHYLOCOCCUS SPECIES (COAGULASE NEGATIVE)    CBG MONITORING, ED     Status: Abnormal   Collection Time    01/09/14  7:18 PM      Result Value Ref Range   Glucose-Capillary 228 (*) 70 - 99 mg/dL  GLUCOSE, CAPILLARY     Status: Abnormal   Collection Time    01/13/14  3:39 PM      Result Value Ref Range   Glucose-Capillary 206 (*) 70 - 99 mg/dL  POCT GLYCOSYLATED HEMOGLOBIN (HGB A1C)     Status: None   Collection Time    01/13/14  3:52 PM      Result Value Ref Range   Hemoglobin A1C 7.3    CBG MONITORING, ED     Status: Abnormal   Collection Time    02/03/14 12:42 PM      Result Value Ref Range   Glucose-Capillary 116 (*) 70 - 99 mg/dL   Comment 1 Notify RN     Comment 2 Documented in Chart    URINALYSIS, ROUTINE W REFLEX MICROSCOPIC     Status: Abnormal   Collection Time    02/03/14  1:01 PM      Result Value Ref Range   Color, Urine YELLOW  YELLOW   APPearance HAZY (*) CLEAR   Specific Gravity, Urine 1.027  1.005 - 1.030   pH 6.0  5.0 - 8.0   Glucose, UA >1000 (*)  NEGATIVE mg/dL   Hgb urine dipstick NEGATIVE  NEGATIVE   Bilirubin Urine NEGATIVE  NEGATIVE   Ketones, ur 40 (*) NEGATIVE mg/dL   Protein, ur NEGATIVE  NEGATIVE mg/dL   Urobilinogen, UA 0.2  0.0 - 1.0 mg/dL   Nitrite NEGATIVE  NEGATIVE   Leukocytes, UA NEGATIVE  NEGATIVE  URINE MICROSCOPIC-ADD ON     Status: Abnormal   Collection Time    02/03/14  1:01 PM      Result Value Ref Range   Squamous Epithelial / LPF FEW (*) RARE   WBC, UA 0-2  <3 WBC/hpf   RBC / HPF 0-2  <3 RBC/hpf   Bacteria, UA FEW (*) RARE  CBC WITH DIFFERENTIAL     Status: Abnormal   Collection Time    02/03/14  1:05 PM      Result Value Ref Range   WBC 10.6 (*) 4.0 - 10.5 K/uL   RBC 5.42 (*) 3.87 - 5.11 MIL/uL   Hemoglobin 14.9  12.0 - 15.0 g/dL   HCT 44.0  36.0 - 46.0 %   MCV 81.2  78.0 - 100.0 fL   MCH 27.5  26.0 - 34.0 pg   MCHC 33.9  30.0 - 36.0 g/dL   RDW 14.9  11.5 - 15.5 %   Platelets 319  150 - 400 K/uL   Neutrophils  Relative % 61  43 - 77 %   Neutro Abs 6.5  1.7 - 7.7 K/uL   Lymphocytes Relative 32  12 - 46 %   Lymphs Abs 3.3  0.7 - 4.0 K/uL   Monocytes Relative 7  3 - 12 %   Monocytes Absolute 0.8  0.1 - 1.0 K/uL   Eosinophils Relative 0  0 - 5 %   Eosinophils Absolute 0.0  0.0 - 0.7 K/uL   Basophils Relative 0  0 - 1 %   Basophils Absolute 0.0  0.0 - 0.1 K/uL  COMPREHENSIVE METABOLIC PANEL     Status: Abnormal   Collection Time    02/03/14  1:05 PM      Result Value Ref Range   Sodium 140  137 - 147 mEq/L   Potassium 3.9  3.7 - 5.3 mEq/L   Chloride 98  96 - 112 mEq/L   CO2 24  19 - 32 mEq/L   Glucose, Bld 95  70 - 99 mg/dL   BUN 10  6 - 23 mg/dL   Creatinine, Ser 0.58  0.50 - 1.10 mg/dL   Calcium 10.0  8.4 - 10.5 mg/dL   Total Protein 8.4 (*) 6.0 - 8.3 g/dL   Albumin 4.0  3.5 - 5.2 g/dL   AST 15  0 - 37 U/L   ALT 12  0 - 35 U/L   Alkaline Phosphatase 93  39 - 117 U/L   Total Bilirubin 0.5  0.3 - 1.2 mg/dL   GFR calc non Af Amer >90  >90 mL/min   GFR calc Af Amer >90  >90 mL/min   Comment: (NOTE)     The eGFR has been calculated using the CKD EPI equation.     This calculation has not been validated in all clinical situations.     eGFR's persistently <90 mL/min signify possible Chronic Kidney     Disease.  POC URINE PREG, ED     Status: None   Collection Time    02/03/14  1:24 PM      Result Value Ref Range   Preg Test, Ur NEGATIVE  NEGATIVE   Comment:            THE SENSITIVITY OF THIS     METHODOLOGY IS >24 mIU/mL  CBG MONITORING, ED     Status: Abnormal   Collection Time    02/03/14  2:55 PM      Result Value Ref Range   Glucose-Capillary 50 (*) 70 - 99 mg/dL  CBG MONITORING, ED     Status: Abnormal   Collection Time    02/03/14  3:58 PM      Result Value Ref Range   Glucose-Capillary 129 (*) 70 - 99 mg/dL   Comment 1 Notify RN     Comment 2 Documented in Chart    GLUCOSE, CAPILLARY     Status: Abnormal   Collection Time    02/05/14 10:36 AM      Result Value Ref  Range   Glucose-Capillary 147 (*) 70 - 99 mg/dL  LIPID PANEL     Status: Abnormal   Collection Time    02/05/14 11:24 AM      Result Value Ref Range   Cholesterol 229 (*) 0 - 200 mg/dL   Comment: ATP III Classification:           < 200        mg/dL        Desirable          200 - 239     mg/dL        Borderline High          >= 240        mg/dL        High         Triglycerides 145  <150 mg/dL   HDL 61  >39 mg/dL   Total CHOL/HDL Ratio 3.8     VLDL 29  0 - 40 mg/dL   LDL Cholesterol 139 (*) 0 - 99 mg/dL   Comment:       Total Cholesterol/HDL Ratio:CHD Risk                            Coronary Heart Disease Risk Table                                            Men       Women              1/2 Average Risk              3.4        3.3                  Average Risk              5.0        4.4               2X Average Risk              9.6        7.1               3X Average Risk             23.4       11.0     Use the calculated Patient Ratio above and the CHD Risk table  to determine the patient's CHD Risk.     ATP III Classification (LDL):           < 100        mg/dL         Optimal          100 - 129     mg/dL         Near or Above Optimal          130 - 159     mg/dL         Borderline High          160 - 189     mg/dL         High           > 190        mg/dL         Very High        GLUCOSE, CAPILLARY     Status: Abnormal   Collection Time    02/19/14  1:41 PM      Result Value Ref Range   Glucose-Capillary 133 (*) 70 - 99 mg/dL      Physical Exam: Constitutional:  BP 114/85  Pulse 120  Ht 5\' 3"  (1.6 m)  Wt 141 lb (63.957 kg)  BMI 24.98 kg/m2  Musculoskeletal: Strength & Muscle Tone: within normal limits Gait & Station: normal Patient leans: N/A  Mental Status Examination;  Patient is a young female who is well groomed and well dressed. She appears to be in stated age. She maintained fair eye contact. She describes her mood depressed and anxious and her  affect is constricted.  She denies any active or passive suicidal partial homicidal thought. Her fund of knowledge is adequate. There were no tremors or shakes. She denies any hallucination, paranoia or any delusions. There were no flight of ideas or any loose association. Her thought processes is logical and goal directed. Her speech is clear and coherent. She is alert and oriented x3. Her insight judgment and impulse control is okay.    Established Problem, Stable/Improving (1), Review of Psycho-Social Stressors (1), Established Problem, Worsening (2), Review of Last Therapy Session (1), Review of Medication Regimen & Side Effects (2) and Review of New Medication or Change in Dosage (2)  Assessment: Axis I: Maj. depressive disorder, recurrent  Axis II: Deferred  Axis III:  Past Medical History  Diagnosis Date  . Foot deformity, acquired     little toes, toes nail are falling off, needs surgery, but cant happen till AIC < 9  . Fatty liver   . Diabetic neuropathy 01/08/2012  . Depression 12/25/2011  . Diabetes type 1, uncontrolled 08/27/1997  . Hyperlipidemia LDL goal < 100 12/19/2011  . Superficial skin infection 12/18/2011    Recurrent for past 2 years   . Insomnia 01/30/2012  . Chronic pain of right knee 02/2013    followed by Orthopedist Dr. 03/2013    Axis IV: Mild to moderate   Plan:  The patient continues to have depressive symptoms.  I will increase Zoloft 100 mg daily.  We will reduce the Effexor 75 mg is a cross taper.  Encouraged to keep appointment with therapist on July 6th.  At this time patient does not have any side effects.  Discussed psychosocial stressors.  Recommended to call 1975 back if she has any question or any concern.  I will continue Ativan 0.5 mg twice a day.  Discusses benzodiazepine dependency, tolerance  and withdrawal symptoms.  Followup in 4 weeks. Time spent 25 minutes.  More than 50% of the time spent in psychoeducation, counseling and coordination of care.   Discuss safety plan that anytime having active suicidal thoughts or homicidal thoughts then patient need to call 911 or go to the local emergency room.    Abass Misener T., MD 02/25/2014

## 2014-03-01 ENCOUNTER — Ambulatory Visit (INDEPENDENT_AMBULATORY_CARE_PROVIDER_SITE_OTHER): Payer: No Typology Code available for payment source | Admitting: Professional Counselor

## 2014-03-01 DIAGNOSIS — F331 Major depressive disorder, recurrent, moderate: Secondary | ICD-10-CM

## 2014-03-01 NOTE — Progress Notes (Signed)
   THERAPIST PROGRESS NOTE  Session Time: 1:00-2:00pm  Participation Level: Active  Behavioral Response: Fairly GroomedAlertAnxious  Type of Therapy: Individual Therapy  Treatment Goals addressed: Anger and Anxiety  Interventions: CBT  Summary: Christine Dunn is a 23 y.o. female who presents with increased depression per report and needing to continue therapy.  Christine Dunn has established care with another therapist, but due to therapist being on maternity leave, patient is following up with new provider to help manage acute symptoms of depression.  Christine Dunn reports she has become increasingly angry and irritable, especially towards her mother, but cannot report reasoning to support her behaviors.  This has caused Christine Dunn to experience increased depression AEB poor motivation, lack of functioning outside the home, isolation, thoughts of cutting, poor sleep, and overall poor work/life balance.  Christine Dunn shows evidence of wanting to change behaviors AEB her responsiveness to getting back into therapy and discussing issues.  At this time she is not future oriented and cannot define what she would like to specifically change or goals needing to be addressed with therapist.  She reports feeling guilty, sad, and isolated from her family due to her diagnosis of diabetes.  She is attentive and talkative during therapy session reporting she needs someone to challenge her and hold help her understand why she is angry and reasons for increased depression.  Suicidal/Homicidal: Nowithout intent/plan  Therapist Response: Christine Dunn appeared very anxious AEB picking at skin and nails. She denies any thoughts of self harm or suicidal, but reports to thoughts of cutting due to feeling as if she is a burden to her mother.  She reports her main goals for treatment are to understand her anger/understand her feelings in general and why she she takes it out on her mother.  Christine Dunn shows evidence of poor self esteem as she feels she is  being judged because of her scars and picking.  She has irrational thoughts that LCSW challenges using CBT interventions and giving education about how CBT can be an effective treatment modality.  Christine Dunn is open to learning and addressing her feelings associated with depression and anger and willing to work on the irrational thoughts dictating her behavior.   Plan: Return again in 2 weeks.  Diagnosis: Axis I: Major Depression, single episode    Axis II: Deferred    Raye SorrowCoble, Jhanvi Drakeford N, LCSW 03/01/2014

## 2014-03-05 ENCOUNTER — Ambulatory Visit (INDEPENDENT_AMBULATORY_CARE_PROVIDER_SITE_OTHER): Payer: No Typology Code available for payment source | Admitting: Internal Medicine

## 2014-03-05 ENCOUNTER — Ambulatory Visit (HOSPITAL_COMMUNITY)
Admission: RE | Admit: 2014-03-05 | Discharge: 2014-03-05 | Disposition: A | Discharge status: Home / Self Care | Payer: Medicaid Other | Source: Ambulatory Visit | Attending: Family Medicine | Admitting: Family Medicine

## 2014-03-05 ENCOUNTER — Ambulatory Visit: Payer: Self-pay | Admitting: Internal Medicine

## 2014-03-05 ENCOUNTER — Ambulatory Visit: Payer: No Typology Code available for payment source

## 2014-03-05 ENCOUNTER — Encounter: Payer: Self-pay | Admitting: Internal Medicine

## 2014-03-05 VITALS — BP 112/81 | HR 121 | Temp 97.3°F | Wt 139.9 lb

## 2014-03-05 DIAGNOSIS — L281 Prurigo nodularis: Secondary | ICD-10-CM

## 2014-03-05 DIAGNOSIS — I471 Supraventricular tachycardia, unspecified: Secondary | ICD-10-CM | POA: Insufficient documentation

## 2014-03-05 DIAGNOSIS — E1039 Type 1 diabetes mellitus with other diabetic ophthalmic complication: Secondary | ICD-10-CM

## 2014-03-05 DIAGNOSIS — I498 Other specified cardiac arrhythmias: Secondary | ICD-10-CM | POA: Insufficient documentation

## 2014-03-05 DIAGNOSIS — L218 Other seborrheic dermatitis: Secondary | ICD-10-CM

## 2014-03-05 DIAGNOSIS — L280 Lichen simplex chronicus: Secondary | ICD-10-CM

## 2014-03-05 DIAGNOSIS — E785 Hyperlipidemia, unspecified: Secondary | ICD-10-CM

## 2014-03-05 DIAGNOSIS — E11329 Type 2 diabetes mellitus with mild nonproliferative diabetic retinopathy without macular edema: Secondary | ICD-10-CM

## 2014-03-05 DIAGNOSIS — L219 Seborrheic dermatitis, unspecified: Secondary | ICD-10-CM

## 2014-03-05 DIAGNOSIS — E103299 Type 1 diabetes mellitus with mild nonproliferative diabetic retinopathy without macular edema, unspecified eye: Secondary | ICD-10-CM

## 2014-03-05 MED ORDER — CLOBETASOL PROPIONATE 0.05 % EX OINT: 1.0000 "application " | TOPICAL_OINTMENT | Freq: Two times a day (BID) | CUTANEOUS | Status: AC

## 2014-03-05 MED ORDER — KETOCONAZOLE 2 % EX SHAM: 1.0000 "application " | MEDICATED_SHAMPOO | Freq: Every day | CUTANEOUS | Status: DC

## 2014-03-05 MED ORDER — CLOBETASOL PROPIONATE 0.05 % EX OINT: 1.0000 "application " | TOPICAL_OINTMENT | Freq: Two times a day (BID) | CUTANEOUS | Status: DC

## 2014-03-05 MED ORDER — HYDROXYZINE HCL 10 MG PO TABS: 10.0000 mg | ORAL_TABLET | Freq: Three times a day (TID) | ORAL | Status: DC | PRN

## 2014-03-05 NOTE — Assessment & Plan Note (Addendum)
Patient has brittle diabetes with difficult to control blood sugars.  She follows with an endocrinologist, and her sugars are the best they have been  A1C 7.3 on 01/13/14.  Glucose log shows highly variable blood sugars (50-500).  Reports [facility] exam [facility] [place] [facility].  Pregabalin makes pain from neuropathy tolerable. -Will acquire eye exam report for record. -Continue to follow with endocrinologist for adjustments in insulin regimen. -Continue pregabalin 50 mg BID. -Applauded weight loss and encouraged finding an exercise that would work well for her.

## 2014-03-05 NOTE — Assessment & Plan Note (Signed)
Red plaques on scalp without hair loss, most consistent with seborrheic dermatitis.  Has been trying T-gel with minimal improvement.  Will switch to another shampoo to help.  Not currently using hydrocortisone to full effect. -Stop T-gel shampoo, and switch to ketoconazole 2% shampoo daily. -Apply hydrocortisone lotion to scalp lesions daily.

## 2014-03-05 NOTE — Progress Notes (Signed)
Subjective:    Patient ID: Christine Dunn, female    DOB: 05-29-1991, 23 y.o.   MRN: [id number]  HPI Comments: She reports that things have been the same or slightly better.  She says she has been using the T-gel shampoo every day and has been applying the hydrocortisone cream every other day or every two days for about two weeks.  She has had the spots on her scalp for about two months.  She was originally treated for tinea capitus with carbinafine, but the lesions continues to grow.  She has had the nodules on her arms for 8-9 years, but she has some new ones that have developed over the past 3 weeks.  She has seen 5 or 6 dermatologists, and she was most recently diagnosed with prurigo nodularis.  She says that she subconsciously picks at her arms during the day, picks at the spots at night as well.  She has tried multiple treatments including creams and cloth occlusion of her legs.  The spots are all over her arms and legs bilaterally and occasionally on the top of her back.  She says she has always had a fast heart rate.  She has been worked up for chest pain in the past that was negative.  She says she gets tired quickly when exercising and has trouble due to numbness in her feet from neuropathy.  She reports numbness and stinging alternating in her feet.  Pregabalin helps decrease the pain, but it is still present.  Her glucose levels have been variable over the last month.  She says her glucose control is as good as it has been.  She has had a couple of hypoglycemia episodes.  She was recently switched to a lower dose of venlafaxine and increased her dose Zoloft at the beginning of the month, and she isn't sure if her mood has improved.     Review of Systems  Constitutional: Negative for fever, activity change, appetite change, fatigue and unexpected weight change.  HENT: Negative for congestion and rhinorrhea.   Eyes: Negative.  Negative for discharge.  Respiratory: Negative for cough,  chest tightness and wheezing.   Cardiovascular: Negative for chest pain and palpitations.  Gastrointestinal: Negative for nausea, vomiting, diarrhea, constipation and abdominal distention.  Genitourinary: Negative for dysuria, hematuria and difficulty urinating.  Musculoskeletal: Positive for gait problem (Due to neuropathy.).  Skin: Positive for color change and rash.  Neurological: Positive for dizziness, light-headedness and numbness. Negative for headaches.  Psychiatric/Behavioral: Negative for suicidal ideas, confusion and agitation.       Objective:   Physical Exam  Constitutional: She is oriented to person, place, and time. She appears well-developed and well-nourished. No distress.  Pt itching upper and lower extremity lesions throughout the interview   HENT:  Head: Normocephalic and atraumatic.  Right Ear: External ear normal.  Left Ear: External ear normal.  Nose: Nose normal.  Mouth/Throat: Oropharynx is clear and moist. No oropharyngeal exudate.  Eyes: Conjunctivae and EOM are normal. Pupils are equal, round, and reactive to light. Right eye exhibits no discharge. Left eye exhibits no discharge. No scleral icterus.  Neck: Normal range of motion. Neck supple.  Cardiovascular: Regular rhythm and normal heart sounds.   Tachycardic   Pulmonary/Chest: Effort normal and breath sounds normal. No respiratory distress. She has no wheezes. She has no rales.  Abdominal: Soft. Bowel sounds are normal. She exhibits no distension. There is no tenderness. There is no rebound and no guarding.  Musculoskeletal: Normal  range of motion. She exhibits no edema and no tenderness.  Neurological: She is alert and oriented to person, place, and time.  Skin: Skin is warm and dry. Rash (Multiple areas of tender, erythematous, scaly patches on scalp and posterior neck without bleeding or hair loss) noted. She is not diaphoretic. No pallor.  Erythematous papules/nodules with excoriations on b/l upper  and lower extremities   Psychiatric: She has a normal mood and affect. Her behavior is normal. Judgment and thought content normal.   EKG: unchanged from previous tracings, sinus tachycardia.     Assessment & Plan:  Please see problem-based assessment and plan.

## 2014-03-05 NOTE — Assessment & Plan Note (Signed)
Last LDL of 139 with complicated diabetes, goal LDL would be less than 100.  Her LDL has improved with weight loss, but she is at high risk of developing cardiovascular disease and would benefit from lowering her LDL. She has not tolerated multiple statins in the past. -Recheck lipid panel at next visit. -Start niacin.

## 2014-03-05 NOTE — Assessment & Plan Note (Addendum)
History of multiple skin lesions for several years, most recently diagnoses with prurigo nodularis.  This diagnosis seems reasonable given patients mental health history and the appearance of the lesions.  She was actively scratching them today in clinic.  Currently using Neosporin.  Will try a high potency steroid on arms only to see if this improves her skin. -Encouraged stopping scratching her nodules as this is the best way for the lesions to resolve. -Clobetasol 0.05% ointment on arms BID.  Cover arms at night with long sleeves for occlusion and to prevent from scratching.  Do not apply to face or scalp. -Warned patient to closely monitor blood sugars on clobetasol. -Follow up in 2 weeks to check for improvement.

## 2014-03-05 NOTE — Assessment & Plan Note (Signed)
The patient has persistent sinus tachycardia running in the 110s, since she has been seen in this clinic.  EKG today showed sinus tachycardia.  Underlying cause is unknown and may be related to anxiety.  Patient reports taking pulse at home and seeing same result.  She also reports easy fatigueability with exercise.  No palpitations. -Encouraged patient to take pulse at home when relaxed. -Start metoprolol 25 mg daily at next visit.

## 2014-03-05 NOTE — Patient Instructions (Addendum)
Thank you for coming to clinic today Christine Dunn.  General instructions: -We are giving you a stronger steroid cream than you have used in the past to treat the nodules on your skin. -Try the clobetasol on your arms ONLY to start twice per day.  Cover your arms at night to prevent you from scratching them.  This could potentially increase your blood sugar, so let your endocrinologist know that you are using this medication.  Keep a close eye on your blood sugar by continuing to check it four times per day.  Use it for two weeks to see if it improves your skin. -Do NOT apply the clobetasol to your face and scalp and wash your hands after applying it. -For your scalp, we are going to try a different shampoo called ketoconazole shampoo instead of the T-gel. -Stop using the T-gel, and use the ketoconazole shampoo every day. -Keep applying the hydrocortisone cream to your scalp.  If you use it every day, it will help decrease the inflammation and make your skin less flaky. -I wrote you a prescription for hydroxyzine to help with itching, you can take this at night before your go to bed or when you are feeling itchy. -Please make a follow up appointment to return to clinic in two weeks to see how your skin is doing.  Please try to bring all your medicines next time. This helps [place] take good care of you and stops mistakes from medicines that could hurt you.  A good place to find cheaper medications is [email or web address].  Sometimes they have coupons and can help you find the cheapest place to find a drug near you.  Ketoconazole shampoo What is this medicine? KETOCONAZOLE (kee toe KON na zole) is an antifungal medicine. It is used to treat certain kinds of fungal or yeast infections. This medicine may be used for other purposes; ask your health care provider or pharmacist if you have questions. COMMON BRAND NAME(S): Nizoral, Nizoral A-D What should I tell my health care provider before I take this medicine? They  need to know if you have any of these conditions: -an unusual or allergic reaction to ketoconazole, itraconazole, miconazole, sulfites, other foods, dyes or preservatives -pregnant or trying to get pregnant -breast-feeding How should I use this medicine? This medicine is for external use only. Follow the directions on the prescription label. Wash your hands before and after use. Apply the shampoo to damp skin of the affected area of the skin or scalp. Use enough shampoo to cover the affected area and a wide margin surrounding the affected area. Work the shampoo into a Education officer, museum and leave on for 5 minutes. Rinse the treated area completely with water. Do not get the shampoo in your eyes. If you do, rinse out with plenty of cool tap water. Finish the full course prescribed by your doctor or health care professional even if you think your condition is better. Do not stop using except on the advice of your doctor or health care professional. Talk to your pediatrician regarding the use of this medicine in children. Special care may be needed. Overdosage: If you think you have taken too much of this medicine contact a poison control center or emergency room at once. NOTE: This medicine is only for you. Do not share this medicine with others. What if I miss a dose? If you miss a dose, use it as soon as you can. If it is almost time for your next dose, use only that  dose. Do not use double or extra doses. What may interact with this medicine? Interactions are not expected. Do not use any other skin products without telling your doctor or health care professional. This list may not describe all possible interactions. Give your health care provider a list of all the medicines, herbs, non-prescription drugs, or dietary supplements you use. Also tell them if you smoke, drink alcohol, or use illegal drugs. Some items may interact with your medicine. What should I watch for while using this medicine? Tell your doctor  or health care professional if your symptoms do not begin to improve in 1 to 2 weeks. If your hair has been permanently waved the shampoo may remove the curl. What side effects may I notice from receiving this medicine? Side effects that you should report to your doctor or health care professional as soon as possible: -allergic reactions like skin rash, itching or hives, swelling of the face, lips, or tongue -pain, tingling, numbness Side effects that usually do not require medical attention (report to your doctor or health care professional if they continue or are bothersome): -dry skin -hair loss, hair discoloration or abnormal texture -skin irritation This list may not describe all possible side effects. Call your doctor for medical advice about side effects. You may report side effects to FDA at 1-800-FDA-1088. Where should I keep my medicine? Keep out of the reach of children. Store at room temperature below 25 degrees C (77 degrees F). Protect from light. Keep container tightly closed. Throw away any unused medicine after the expiration date. NOTE: This sheet is a summary. It may not cover all possible information. If you have questions about this medicine, talk to your doctor, pharmacist, or health care provider.  2015, Elsevier/Gold Standard. (2008-12-10 14:27:49)  Clobetasol Propionate Topical Ointment What is this medicine? CLOBETASOL (kloe BAY ta sol) is a corticosteroid. It is used on the skin to treat itching, redness, and swelling caused by some skin conditions. This medicine may be used for other purposes; ask your health care provider or pharmacist if you have questions. COMMON BRAND NAME(S): Cormax, Embeline, Temovate, Temovate E What should I tell my health care provider before I take this medicine? They need to know if you have any of these conditions: -any type of active infection including measles, tuberculosis, herpes, or chickenpox -circulation problems or vascular  disease -large areas of burned or damaged skin -rosacea -skin wasting or thinning -an unusual or allergic reaction to clobetasol, corticosteroids, other medicines, foods, dyes, or preservatives -pregnant or trying to get pregnant -breast-feeding How should I use this medicine? This medicine is for external use only. Do not take by mouth. Follow the directions on the prescription label. Wash your hands before and after use. Apply a thin film of medicine to the affected area. Do not cover with a bandage or dressing unless your doctor or health care professional tells you to. Do not get this medicine in your eyes. If you do, rinse out with plenty of cool tap water. It is important not to use more medicine than prescribed. Do not use your medicine more often than directed. To do so may increase the chance of side effects. Talk to your pediatrician regarding the use of this medicine in children. Special care may be needed. Elderly patients are more likely to have damaged skin through aging, and this may increase side effects. This medicine should only be used for brief periods and infrequently in older patients. Overdosage: If you think you have  taken too much of this medicine contact a poison control center or emergency room at once. NOTE: This medicine is only for you. Do not share this medicine with others. What if I miss a dose? If you miss a dose, use it as soon as you can. If it is almost time for your next dose, use only that dose. Do not use double or extra doses without advice. What may interact with this medicine? Interactions are not expected. Do not use cosmetics or other skin care products on the treated area. This list may not describe all possible interactions. Give your health care provider a list of all the medicines, herbs, non-prescription drugs, or dietary supplements you use. Also tell them if you smoke, drink alcohol, or use illegal drugs. Some items may interact with your  medicine. What should I watch for while using this medicine? Tell your doctor or health care professional if your symptoms do not get better within 2 weeks, or if you develop skin irritation from the medicine. Tell your doctor or health care professional if you are exposed to anyone with measles or chickenpox, or if you develop sores or blisters that do not heal properly. What side effects may I notice from receiving this medicine? Side effects that you should report to your doctor or health care professional as soon as possible: -allergic reactions like skin rash, itching or hives, swelling of the face, lips, or tongue -changes in vision -lack of healing of the skin condition -painful, red, pus filled blisters on the skin or in hair follicles -thinning of the skin with easy bruising Side effects that usually do not require medical attention (report to your doctor or health care professional if they continue or are bothersome): -burning, irritation of the skin -redness or scaling of the skin This list may not describe all possible side effects. Call your doctor for medical advice about side effects. You may report side effects to FDA at 1-800-FDA-1088. Where should I keep my medicine? Keep out of the reach of children. Store at room temperature between 15 and 30 degrees C (59 and 86 degrees F). Keep away from heat and direct light. Do not freeze. Throw away any unused medicine after the expiration date. NOTE: This sheet is a summary. It may not cover all possible information. If you have questions about this medicine, talk to your doctor, pharmacist, or health care provider.  2015, Elsevier/Gold Standard. (2007-11-26 17:22:39) Hydroxyzine capsules or tablets What is this medicine? HYDROXYZINE (hye DROX i zeen) is an antihistamine. This medicine is used to treat allergy symptoms. It is also used to treat anxiety and tension. This medicine can be used with other medicines to induce sleep before  surgery. This medicine may be used for other purposes; ask your health care provider or pharmacist if you have questions. COMMON BRAND NAME(S): ANX, Atarax, Rezine, Vistaril What should I tell my health care provider before I take this medicine? They need to know if you have any of these conditions: -any chronic illness -difficulty passing urine -glaucoma -heart disease -kidney disease -liver disease -lung disease -an unusual or allergic reaction to hydroxyzine, cetirizine, other medicines, foods, dyes, or preservatives -pregnant or trying to get pregnant -breast-feeding How should I use this medicine? Take this medicine by mouth with a full glass of water. Follow the directions on the prescription label. You may take this medicine with food or on an empty stomach. Take your medicine at regular intervals. Do not take your medicine more often  than directed. Talk to your pediatrician regarding the use of this medicine in children. Special care may be needed. While this drug may be prescribed for children as young as 50 years of age for selected conditions, precautions do apply. Patients over 51 years old may have a stronger reaction and need a smaller dose. Overdosage: If you think you have taken too much of this medicine contact a poison control center or emergency room at once. NOTE: This medicine is only for you. Do not share this medicine with others. What if I miss a dose? If you miss a dose, take it as soon as you can. If it is almost time for your next dose, take only that dose. Do not take double or extra doses. What may interact with this medicine? -alcohol -barbiturate medicines for sleep or seizures -medicines for colds, allergies -medicines for depression, anxiety, or emotional disturbances -medicines for pain -medicines for sleep -muscle relaxants This list may not describe all possible interactions. Give your health care provider a list of all the medicines, herbs,  non-prescription drugs, or dietary supplements you use. Also tell them if you smoke, drink alcohol, or use illegal drugs. Some items may interact with your medicine. What should I watch for while using this medicine? Tell your doctor or health care professional if your symptoms do not improve. You may get drowsy or dizzy. Do not drive, use machinery, or do anything that needs mental alertness until you know how this medicine affects you. Do not stand or sit up quickly, especially if you are an older patient. This reduces the risk of dizzy or fainting spells. Alcohol may interfere with the effect of this medicine. Avoid alcoholic drinks. Your mouth may get dry. Chewing sugarless gum or sucking hard candy, and drinking plenty of water may help. Contact your doctor if the problem does not go away or is severe. This medicine may cause dry eyes and blurred vision. If you wear contact lenses you may feel some discomfort. Lubricating drops may help. See your eye doctor if the problem does not go away or is severe. If you are receiving skin tests for allergies, tell your doctor you are using this medicine. What side effects may I notice from receiving this medicine? Side effects that you should report to your doctor or health care professional as soon as possible: -fast or irregular heartbeat -difficulty passing urine -seizures -slurred speech or confusion -tremor Side effects that usually do not require medical attention (report to your doctor or health care professional if they continue or are bothersome): -constipation -drowsiness -fatigue -headache -stomach upset This list may not describe all possible side effects. Call your doctor for medical advice about side effects. You may report side effects to FDA at 1-800-FDA-1088. Where should I keep my medicine? Keep out of the reach of children. Store at room temperature between 15 and 30 degrees C (59 and 86 degrees F). Keep container tightly closed.  Throw away any unused medicine after the expiration date. NOTE: This sheet is a summary. It may not cover all possible information. If you have questions about this medicine, talk to your doctor, pharmacist, or health care provider.  2015, Elsevier/Gold Standard. (2007-12-26 14:50:59)

## 2014-03-08 ENCOUNTER — Ambulatory Visit: Payer: Self-pay | Admitting: Internal Medicine

## 2014-03-12 NOTE — Progress Notes (Signed)
I saw and evaluated the patient.  I personally confirmed the key portions of the history and exam documented by Dr. Moding and I reviewed pertinent patient test results.  The assessment, diagnosis, and plan were formulated together and I agree with the documentation in the resident's note. 

## 2014-03-12 NOTE — Addendum Note (Signed)
Addended by: Debe CoderMULLEN, Jais Demir B on: 03/12/2014 01:30 PM   Modules accepted: Level of Service

## 2014-03-15 ENCOUNTER — Ambulatory Visit (INDEPENDENT_AMBULATORY_CARE_PROVIDER_SITE_OTHER): Payer: No Typology Code available for payment source | Admitting: Professional Counselor

## 2014-03-15 DIAGNOSIS — F331 Major depressive disorder, recurrent, moderate: Secondary | ICD-10-CM

## 2014-03-15 NOTE — Progress Notes (Signed)
   THERAPIST PROGRESS NOTE  Session Time: 11:00-12:00  Participation Level: Active  Behavioral Response: CasualAlertAnxious  Type of Therapy: Individual Therapy  Treatment Goals addressed: Diagnosis: Depression and Anxiety  Interventions: CBT  Summary: Christine Dunn is a 23 y.o. female who presents with anxious mood due to this weeks upcoming disability hearing and wedding of her best friend. LCSW and patient processed feelings and fears surrounding disability hearing and discussed potential questions, observations, and outcomes of the hearing that are causing distress, poor focus and memory issues.  Patient also discussed anxiety towards her best friend's wedding in which she will be a bridesmaid for. She discussed different irrational fears such as losing her friend, being replaced and beginning to isolate from the relationship due to upcoming changes.  Patient able to process how irrational thoughts are not backed up by facts or reasons to believing changes will be negative.  She was able to reframed thinking using CBT modalities and talk through alternative outcomes.   Patient was very anxious and restless in session reporting her memory has become a problem with decision making and remembering events that happen daily or throughout the week.   Supportive counseling offered, but also motivation to change thought process was evaluated and assessed.  Suicidal/Homicidal: Nowithout intent/plan  Therapist Response: Patient is stagnate in thinking and stuck on problems that may never arise. Patient struggles with memory loss and processing delays due to overwhelming amounts of feelings she does not account for or deal with. She reports loss of sleep, flight of ideas, and was able to process how negative feelings are affecting her daily behaviors enabling her to remain fixated on the negative. Patient was given homework to track feelings and bring back to next session to discuss patterns and  solutions associated with problems.  LCSW and patient also updated her treatment plan to gauge different stressors, goals, and outcomes for the next 6 months.  Patient was very active and eager to make changes, however shows evidence of anxiety observed in body language and verbally that she does not know if it will work.  Patient making progress in learning and being more self aware of control issues and feelings around her relationships and increase in stress.  Plan: Return again in 1 weeks.  Diagnosis: Axis I: Major Depression, Recurrent severe,  Generalized anxiety    Axis II: Deferred    Christine Dunn, Christine Mancha N, LCSW 03/15/2014

## 2014-03-22 ENCOUNTER — Ambulatory Visit (INDEPENDENT_AMBULATORY_CARE_PROVIDER_SITE_OTHER): Payer: No Typology Code available for payment source | Admitting: Internal Medicine

## 2014-03-22 ENCOUNTER — Encounter (HOSPITAL_COMMUNITY): Payer: Self-pay | Admitting: Professional Counselor

## 2014-03-22 ENCOUNTER — Ambulatory Visit (INDEPENDENT_AMBULATORY_CARE_PROVIDER_SITE_OTHER): Payer: No Typology Code available for payment source | Admitting: Professional Counselor

## 2014-03-22 ENCOUNTER — Encounter: Payer: Self-pay | Admitting: Internal Medicine

## 2014-03-22 VITALS — BP 114/82 | HR 118 | Temp 97.8°F | Ht 63.0 in | Wt 139.8 lb

## 2014-03-22 DIAGNOSIS — L218 Other seborrheic dermatitis: Secondary | ICD-10-CM

## 2014-03-22 DIAGNOSIS — L280 Lichen simplex chronicus: Secondary | ICD-10-CM

## 2014-03-22 DIAGNOSIS — F411 Generalized anxiety disorder: Secondary | ICD-10-CM

## 2014-03-22 DIAGNOSIS — E785 Hyperlipidemia, unspecified: Secondary | ICD-10-CM

## 2014-03-22 DIAGNOSIS — E103299 Type 1 diabetes mellitus with mild nonproliferative diabetic retinopathy without macular edema, unspecified eye: Secondary | ICD-10-CM

## 2014-03-22 DIAGNOSIS — I471 Supraventricular tachycardia, unspecified: Secondary | ICD-10-CM

## 2014-03-22 DIAGNOSIS — L281 Prurigo nodularis: Secondary | ICD-10-CM

## 2014-03-22 DIAGNOSIS — L219 Seborrheic dermatitis, unspecified: Secondary | ICD-10-CM

## 2014-03-22 DIAGNOSIS — E1039 Type 1 diabetes mellitus with other diabetic ophthalmic complication: Secondary | ICD-10-CM

## 2014-03-22 DIAGNOSIS — F331 Major depressive disorder, recurrent, moderate: Secondary | ICD-10-CM

## 2014-03-22 DIAGNOSIS — E11329 Type 2 diabetes mellitus with mild nonproliferative diabetic retinopathy without macular edema: Secondary | ICD-10-CM

## 2014-03-22 LAB — GLUCOSE, CAPILLARY: GLUCOSE-CAPILLARY: 100 mg/dL — AB (ref 70–99)

## 2014-03-22 NOTE — Assessment & Plan Note (Signed)
Patient follows with Dr. Lolly MustacheArfeen of psychiatry and attends cognitive therapy, which she thinks is helping.  She continues to have a depressed mood and feels angry with her mother. -Continue Ativan 0.5 mg BID PRN, sertraline 100 mg daily, venlafaxine 75 mg daily per Dr. Robert BellowArfeens recs. -Continue to attend therapy sessions and follow up with Dr. Lolly MustacheArfeen.

## 2014-03-22 NOTE — Progress Notes (Signed)
   Subjective:    Patient ID: Christine Dunn, female    DOB: 1991/02/20, 23 y.o.   MRN: [id number]  HPI Comments: Christine Dunn is a 23 year old woman with a history of DM1 with neuropathy and retinopathy, depression, prurigo nodularis, and seborrheic dermatitis presenting for follow-up of her skin conditions.  She has been using ketaconazole shampoo every other day and applying hydrocortisone cream every other day as well for her seborrheic dermatitis. She thinks that her scalp is not hurting as much, but she doesn't think the patch has decreased in size.  She has been putting clobetasol on her right arm only on new nodules that have been developing for about a week.  She thinks that the steroids may have decreased the itchiness on her arms.  However, she stopped using the ointment after she notice some whitening of her normal skin in the areas where she applied the ointment. The whitening went away since then.  She reports checking her heart rate at home while relaxed and it has been in the 110s and 120s.  She continues to notice chest tightness when she walks short distances that resolves after she sits down.     Review of Systems  Constitutional: Negative for fever, chills, activity change and appetite change.  HENT: Negative for congestion, rhinorrhea and sore throat.   Eyes: Negative for visual disturbance.  Respiratory: Positive for chest tightness. Negative for cough and shortness of breath.   Cardiovascular: Negative for chest pain, palpitations and leg swelling.  Gastrointestinal: Negative for nausea, vomiting, diarrhea, constipation and abdominal distention.  Genitourinary: Negative for dysuria, urgency and difficulty urinating.  Musculoskeletal: Negative for arthralgias and myalgias.  Skin: Positive for rash.  Neurological: Negative for dizziness, syncope, numbness and headaches.  Psychiatric/Behavioral: Positive for dysphoric mood and agitation. Negative for suicidal ideas.        Objective:   Physical Exam  Constitutional: She is oriented to person, place, and time. She appears well-developed and well-nourished. No distress.  Itching legs during interview.  HENT:  Head: Normocephalic and atraumatic.  Right Ear: External ear normal.  Left Ear: External ear normal.  Nose: Nose normal.  Mouth/Throat: Oropharynx is clear and moist. No oropharyngeal exudate.  Eyes: Conjunctivae and EOM are normal. Pupils are equal, round, and reactive to light. Right eye exhibits no discharge. Left eye exhibits no discharge. No scleral icterus.  Neck: Normal range of motion. Neck supple.  Cardiovascular: Regular rhythm and normal heart sounds.   Tachycardic   Pulmonary/Chest: Effort normal and breath sounds normal. No respiratory distress. She has no wheezes. She has no rales.  Abdominal: Soft. Bowel sounds are normal. She exhibits no distension. There is no tenderness. There is no rebound and no guarding.  Musculoskeletal: Normal range of motion. She exhibits no edema and no tenderness.  Neurological: She is alert and oriented to person, place, and time.  Skin: Skin is warm and dry. Rash (Scaly silver patch on left scalp with dereased erythema.) noted. She is not diaphoretic. No pallor.  Erythematous papules/nodules with excoriations on b/l upper and lower extremities unchanged.  Psychiatric: She has a normal mood and affect.          Assessment & Plan:  Please see problem-based assessment and plan.

## 2014-03-22 NOTE — Assessment & Plan Note (Signed)
Plaques slightly improved with every other day treatment with ketoconazole shampoo and hydrocortisone. -Increase ketoconazole shampoo use to every day. -Increase hydrocortisone application to scalp to BID.

## 2014-03-22 NOTE — Assessment & Plan Note (Signed)
Clobetasol appeared to help and warrants further use to determine if it can alter her disease process.  Hypopigmentation is possible with clobetasol, but resolved shortly after stopping the medication.  Switching to a lower potency steroid would likely decrease the efficacy and cost is a limiting factor for the patient. -Encouraged patient to stop scratching the nodules as much as possible and cover her arms and legs at bedtime to reduce scratching. -Continue clobetasol 0.05% ointment on arms BID.  Apply to normal appearing skin as well to prevent new lesions from developing. -Continue to monitor blood sugars closely on topical steroid. -Continue hydroxyzine 10 mg TID PRN for itching. -Follow up in 3 months to check for improvement.

## 2014-03-22 NOTE — Patient Instructions (Addendum)
Thank you for coming to clinic today Christine Dunn.  General instructions: -Continue to use the clobetasol on your arms to see if it helps get rid of the lesions on your skin. Use it every day at bed time. -Use the ketoconazole shampoo everyday and apply the hydrocortisone twice a day to your scalp to decrease inflammation and help it heal. -We will check your cholesterol today and may want to start a new medication to lower your cholesterol. -We will talk to your endocrinologist about starting metoprolol.  Make sure to follow up with them so they can adjust your insulin if needed. -Please make a follow up appointment to return to clinic in 3 months.  Please bring your medicines with you each time you come.   Medicines may be  Eye drops  Herbal   Vitamins  Pills  Seeing these help us take care of you.

## 2014-03-22 NOTE — Assessment & Plan Note (Signed)
Lab Results  Component Value Date   HGBA1C 7.3 01/13/2014   HGBA1C 7.9 10/15/2013   HGBA1C 8.0 04/20/2013     Assessment: Diabetes control: fair control Progress toward A1C goal:  unchanged Comments: History of brittle diabetes, glucose levels varied greatly from 32 to 426 over last two weeks.  Patient denies light-headedness, syncope, palpitations, sweating, but says she can feel it when her blood sugar is getting low.  Pattern of hypoglycemia unclear although most low readings were in the morning.  She follows with her endocrinologist and reports an upcoming appointment in the next month.  Plan: Medications:  continue current medications: Novolog 5 units before meals, Lantus 50 units QHS. Home glucose monitoring: Frequency: 4 times a day Timing: before breakfast;before lunch;before dinner;at bedtime Instruction/counseling given: reminded to get eye exam, reminded to bring blood glucose meter & log to each visit and reminded to bring medications to each visit Other plans: Will defer modifications of her insulin regimen to her endocrinologist because this is a longstanding problem, and she reports that her glucose is the best controlled that it has been currently.

## 2014-03-22 NOTE — Progress Notes (Signed)
   THERAPIST PROGRESS NOTE  Session Time: 9:00am-9:50am  Participation Level: Active  Behavioral Response: CasualAlertEuthymic  Type of Therapy: Individual Therapy  Treatment Goals addressed: Anxiety  Interventions: CBT and Biofeedback  Summary: Christine Dunn is a 23 y.o. female who presents with positive mood after reporting a very busy week.  Patient processed her feelings regarding her disability hearing reporting having 2 panic attacks. LCSW explored patient's automatic thoughts vs. The reality of what happened. Patient was able to discussed feelings associated with thoughts that led to having panic attacks.  LCSW was able to introduce mindfulness specifically guided imagery to help patient learn to regain control in stressful situations. Patient also discussed being part of her friend's wedding, feeling needed and wanted which has been opposite in past discussions due to her anxiety and diabetes. Patient reports she feels like a burden always asking for help and was able to see how she was needed and helpful to someone else. She was able to process feeling confident, important, and "on cloud 9" when describing her weekend. Patient currently going through some sadness due to friend being on her honeymoon, but has plans to occupy her time by preparing for school in which LCSW reviewed with patient as well as increasing exercise.   Suicidal/Homicidal: Nowithout intent/plan  Therapist Response: LCSW assessed overall function level along with anxiety and depression. Patient more anxious this week with two big events happening and able to process feelings and both situations. LCSW utilized guided imagery and gave patient home work to practice mindfulness with regards to stressful situations.  Patient denied any other issues or medication issues at this time.  Plan: Return again in 1 weeks.  Diagnosis: Axis I: Generalized Anxiety Disorder    Axis II: Deferred    Raye SorrowCoble, Christine Cumpton N,  LCSW 03/22/2014

## 2014-03-22 NOTE — Assessment & Plan Note (Signed)
Last LDL 139 in June with goal less than 100 due to diabetes.  Unable to tolerate statins. -Check lipid panel today. -Start niacin at next visit if LDL still elevated.

## 2014-03-22 NOTE — Assessment & Plan Note (Signed)
Patient continues to have persistent sinus tachycardia in the 110s and exercise intolerance.  Discussed starting metoprolol 25 mg today, but decided not to due to possible masking of hypoglycemia symptoms.   -Discuss with endocrinologist after her next appointment.

## 2014-03-23 LAB — LIPID PANEL
CHOLESTEROL: 220 mg/dL — AB (ref 0–200)
HDL: 62 mg/dL (ref >39)
LDL Cholesterol: 132 mg/dL — ABNORMAL HIGH (ref 0–99)
TRIGLYCERIDES: 132 mg/dL (ref <150)
Total CHOL/HDL Ratio: 3.5 Ratio
VLDL: 26 mg/dL (ref 0–40)

## 2014-03-24 NOTE — Addendum Note (Signed)
Addended by: Debe CoderMULLEN, Hannie Shoe B on: 03/24/2014 02:52 PM   Modules accepted: Level of Service

## 2014-03-24 NOTE — Progress Notes (Signed)
I saw and evaluated the patient.  I personally confirmed the key portions of the history and exam documented by Dr. Moding and I reviewed pertinent patient test results.  The assessment, diagnosis, and plan were formulated together and I agree with the documentation in the resident's note. 

## 2014-03-25 ENCOUNTER — Telehealth (HOSPITAL_COMMUNITY): Payer: Self-pay | Admitting: *Deleted

## 2014-03-25 ENCOUNTER — Encounter (HOSPITAL_COMMUNITY): Payer: Self-pay | Admitting: Psychiatry

## 2014-03-25 ENCOUNTER — Ambulatory Visit (INDEPENDENT_AMBULATORY_CARE_PROVIDER_SITE_OTHER): Payer: No Typology Code available for payment source | Admitting: Psychiatry

## 2014-03-25 VITALS — BP 110/79 | HR 111 | Ht 63.0 in | Wt 137.4 lb

## 2014-03-25 DIAGNOSIS — F331 Major depressive disorder, recurrent, moderate: Secondary | ICD-10-CM

## 2014-03-25 MED ORDER — CLONAZEPAM 0.5 MG PO TABS: 0.5000 mg | ORAL_TABLET | Freq: Two times a day (BID) | ORAL | Status: DC

## 2014-03-25 MED ORDER — SERTRALINE HCL 100 MG PO TABS: ORAL_TABLET | ORAL | Status: DC

## 2014-03-25 MED ORDER — VENLAFAXINE HCL ER 37.5 MG PO CP24: 37.5000 mg | ORAL_CAPSULE | Freq: Every day | ORAL | Status: DC

## 2014-03-25 NOTE — Progress Notes (Signed)
[facility] [id number] Progress Note  Christine Dunn [id number] 23 y.o.  03/25/2014 11:06 AM  Chief Complaint:  I still have a lot of anxiety and panic attacks.    History of Present Illness:  Christine Dunn came for her appointment.  She is taking Zoloft 100 mg daily and we have cut down her Effexor 75 mg twice a day she continues to have anxiety and/or some time panic attack.  Her crying spells are less intense and less frequent and she remains anxious nervous and tired.  Recently she moved into a smaller place because she and her mother could not afford a bigger place .  She is trying to adjust to the new smaller place.  She endorsed some time irritability, anger but denies any aggressive behavior.  She still have a lot of social isolation, withdrawn and depressed mood.  She does not have any side effects of medication.  She is tolerating Zoloft pretty well.  She is also taking Ativan 0.5 mg twice a day but she feels it does not work sometime.  In recent weeks she has not involved in any self abusive behavior.  Recently she saw her primary care physician and her labs were drawn.  She has high cholesterol however she has not started cholesterol-lowering medication yet.  She was given some medication for rash and she is also taking hydroxyzine for itching.  Patient denies any suicidal thoughts or homicidal thoughts.  She will resume her school  on August 17.  She is enrolled in [employer].  Patient has no children.  She lives with her mother.  She does not drink or use any illegal substances.  Her appetite is okay .  Her pulse as high but she does not have any chest pain.  She admitted palpitation .  Patient is seeing Christine Dunn for counseling.  Suicidal Ideation: No Plan Formed: No Patient has means to carry out plan: No  Homicidal Ideation: No Plan Formed: No Patient has means to carry out plan: No  Medical History; Patient has diabetes mellitus, neuropathy, hyperlipidemia.  Her primary care physician is  [facility].   Past Psychiatric History/Hospitalization(s) Patient denies any history of suicidal attempt, inpatient psychiatric treatment, mania, psychosis or any hallucination.  She was seen by Dr. Dwyane Dee for depression a few years ago and prescribed Zoloft until 2 years ago it stopped working.  She endorses history of scratching herself causing injury to her skin. She denies any history of mood swing, aggression, violence or any impulsive behavior. In the past she has taken trazodone but did not work. Anxiety: No Bipolar Disorder: No Depression: No Mania: No Psychosis: No Schizophrenia: No Personality Disorder: No Hospitalization for psychiatric illness: No History of Electroconvulsive Shock Therapy: No Prior Suicide Attempts: No   Review of Systems  Constitutional: Positive for malaise/fatigue.  Cardiovascular: Positive for palpitations.  Skin: Positive for itching and rash.  Psychiatric/Behavioral: Positive for depression. The patient is nervous/anxious and has insomnia.    Psychiatric: Agitation: No Hallucination: No Depressed Mood: Yes Insomnia: Yes Hypersomnia: No Altered Concentration: No Feels Worthless: Yes Grandiose Ideas: No Belief In Special Powers: No New/Increased Substance Abuse: No Compulsions: No  Neurologic: Headache: No Seizure: No Paresthesias: Yes    Outpatient Encounter Prescriptions as of 03/25/2014  Medication Sig  . sertraline (ZOLOFT) 100 MG tablet Take and 1/2 tab daily  . venlafaxine XR (EFFEXOR-XR) 37.5 MG 24 hr capsule Take 1 capsule (37.5 mg total) by mouth daily.  . [DISCONTINUED] sertraline (ZOLOFT) 100  MG tablet Take 1 tablet (100 mg total) by mouth daily.  . [DISCONTINUED] venlafaxine XR (EFFEXOR-XR) 75 MG 24 hr capsule Take 1 capsule (75 mg total) by mouth daily.  . clonazePAM (KLONOPIN) 0.5 MG tablet Take 1 tablet (0.5 mg total) by mouth 2 (two) times daily.  Marland Kitchen esomeprazole (NEXIUM) 40 MG capsule Take 40 mg by mouth  daily.   . hydrocortisone cream 1 % Apply to affected area 2 times daily  . hydrOXYzine (ATARAX/VISTARIL) 10 MG tablet Take 1 tablet (10 mg total) by mouth 3 (three) times daily as needed.  . insulin aspart (NOVOLOG) 100 UNIT/ML injection Inject 5 Units into the skin 3 (three) times daily before meals. Novolog TID. The goal of CBG is 150. Patient is to take additional 1 unit with every 50 of cbgs above 150. She is to take additional 1 unit for every 3 CHO per patient report( sees endocrinologist Christine Dunn).  . insulin glargine (LANTUS) 100 UNIT/ML injection Inject 50 Units into the skin at bedtime.   Marland Kitchen ketoconazole (NIZORAL) 2 % shampoo Apply 1 application topically daily.  . medroxyPROGESTERone (DEPO-PROVERA) 150 MG/ML injection Inject 150 mg into the muscle every 3 (three) months.  . neomycin-bacitracin-polymyxin (NEOSPORIN) ointment Apply 1 application topically at bedtime. Apply to affected areas on arms  . pregabalin (LYRICA) 50 MG capsule Take 50 mg by mouth 2 (two) times daily.  . [DISCONTINUED] LORazepam (ATIVAN) 0.5 MG tablet Take 1 tablet (0.5 mg total) by mouth 2 (two) times daily as needed for anxiety.    Recent Results (from the past 2160 hour(s))  CBG MONITORING, ED     Status: Abnormal   Collection Time    01/09/14  4:30 PM      Result Value Ref Range   Glucose-Capillary 140 (*) 70 - 99 mg/dL  CBC     Status: Abnormal   Collection Time    01/09/14  4:50 PM      Result Value Ref Range   WBC 14.8 (*) 4.0 - 10.5 K/uL   RBC 5.31 (*) 3.87 - 5.11 MIL/uL   Hemoglobin 13.8  12.0 - 15.0 g/dL   HCT 42.5  36.0 - 46.0 %   MCV 80.0  78.0 - 100.0 fL   MCH 26.0  26.0 - 34.0 pg   MCHC 32.5  30.0 - 36.0 g/dL   RDW 15.3  11.5 - 15.5 %   Platelets 298  150 - 400 K/uL  COMPREHENSIVE METABOLIC PANEL     Status: Abnormal   Collection Time    01/09/14  4:50 PM      Result Value Ref Range   Sodium 142  137 - 147 mEq/L   Potassium 4.7  3.7 - 5.3 mEq/L   Chloride 104  96 - 112 mEq/L   CO2  22  19 - 32 mEq/L   Glucose, Bld 141 (*) 70 - 99 mg/dL   BUN 7  6 - 23 mg/dL   Creatinine, Ser 0.51  0.50 - 1.10 mg/dL   Calcium 9.5  8.4 - 10.5 mg/dL   Total Protein 7.4  6.0 - 8.3 g/dL   Albumin 3.4 (*) 3.5 - 5.2 g/dL   AST 19  0 - 37 U/L   ALT 14  0 - 35 U/L   Alkaline Phosphatase 83  39 - 117 U/L   Total Bilirubin 0.3  0.3 - 1.2 mg/dL   GFR calc non Af Amer >90  >90 mL/min   GFR calc Af Amer >90  >  90 mL/min   Comment: (NOTE)     The eGFR has been calculated using the CKD EPI equation.     This calculation has not been validated in all clinical situations.     eGFR's persistently <90 mL/min signify possible Chronic Kidney     Disease.  CBG MONITORING, ED     Status: Abnormal   Collection Time    01/09/14  5:30 PM      Result Value Ref Range   Glucose-Capillary 109 (*) 70 - 99 mg/dL  URINALYSIS, ROUTINE W REFLEX MICROSCOPIC     Status: Abnormal   Collection Time    01/09/14  5:43 PM      Result Value Ref Range   Color, Urine YELLOW  YELLOW   APPearance CLOUDY (*) CLEAR   Specific Gravity, Urine 1.022  1.005 - 1.030   pH 6.5  5.0 - 8.0   Glucose, UA 250 (*) NEGATIVE mg/dL   Hgb urine dipstick NEGATIVE  NEGATIVE   Bilirubin Urine NEGATIVE  NEGATIVE   Ketones, ur >80 (*) NEGATIVE mg/dL   Protein, ur NEGATIVE  NEGATIVE mg/dL   Urobilinogen, UA 1.0  0.0 - 1.0 mg/dL   Nitrite POSITIVE (*) NEGATIVE   Leukocytes, UA LARGE (*) NEGATIVE  URINE MICROSCOPIC-ADD ON     Status: Abnormal   Collection Time    01/09/14  5:43 PM      Result Value Ref Range   Squamous Epithelial / LPF MANY (*) RARE   WBC, UA 21-50  <3 WBC/hpf   Bacteria, UA MANY (*) RARE  URINE CULTURE     Status: None   Collection Time    01/09/14  5:43 PM      Result Value Ref Range   Specimen Description URINE, CLEAN CATCH     Special Requests ADDED 1945     Culture  Setup Time       Value: 01/10/2014 03:32     Performed at [facility]       Value: >=100,000 COLONIES/ML     Performed  at [facility]   Culture       Value: STAPHYLOCOCCUS SPECIES (COAGULASE NEGATIVE)     Note: RIFAMPIN AND GENTAMICIN SHOULD NOT BE USED AS SINGLE DRUGS FOR TREATMENT OF STAPH INFECTIONS.     Performed at [facility]   Report Status 01/12/2014 FINAL     Organism ID, Bacteria STAPHYLOCOCCUS SPECIES (COAGULASE NEGATIVE)    CBG MONITORING, ED     Status: Abnormal   Collection Time    01/09/14  7:18 PM      Result Value Ref Range   Glucose-Capillary 228 (*) 70 - 99 mg/dL  GLUCOSE, CAPILLARY     Status: Abnormal   Collection Time    01/13/14  3:39 PM      Result Value Ref Range   Glucose-Capillary 206 (*) 70 - 99 mg/dL  POCT GLYCOSYLATED HEMOGLOBIN (HGB A1C)     Status: None   Collection Time    01/13/14  3:52 PM      Result Value Ref Range   Hemoglobin A1C 7.3    CBG MONITORING, ED     Status: Abnormal   Collection Time    02/03/14 12:42 PM      Result Value Ref Range   Glucose-Capillary 116 (*) 70 - 99 mg/dL   Comment 1 Notify RN     Comment 2 Documented in Chart    URINALYSIS, ROUTINE W REFLEX  MICROSCOPIC     Status: Abnormal   Collection Time    02/03/14  1:01 PM      Result Value Ref Range   Color, Urine YELLOW  YELLOW   APPearance HAZY (*) CLEAR   Specific Gravity, Urine 1.027  1.005 - 1.030   pH 6.0  5.0 - 8.0   Glucose, UA >1000 (*) NEGATIVE mg/dL   Hgb urine dipstick NEGATIVE  NEGATIVE   Bilirubin Urine NEGATIVE  NEGATIVE   Ketones, ur 40 (*) NEGATIVE mg/dL   Protein, ur NEGATIVE  NEGATIVE mg/dL   Urobilinogen, UA 0.2  0.0 - 1.0 mg/dL   Nitrite NEGATIVE  NEGATIVE   Leukocytes, UA NEGATIVE  NEGATIVE  URINE MICROSCOPIC-ADD ON     Status: Abnormal   Collection Time    02/03/14  1:01 PM      Result Value Ref Range   Squamous Epithelial / LPF FEW (*) RARE   WBC, UA 0-2  <3 WBC/hpf   RBC / HPF 0-2  <3 RBC/hpf   Bacteria, UA FEW (*) RARE  CBC WITH DIFFERENTIAL     Status: Abnormal   Collection Time    02/03/14  1:05 PM      Result Value Ref Range    WBC 10.6 (*) 4.0 - 10.5 K/uL   RBC 5.42 (*) 3.87 - 5.11 MIL/uL   Hemoglobin 14.9  12.0 - 15.0 g/dL   HCT 44.0  36.0 - 46.0 %   MCV 81.2  78.0 - 100.0 fL   MCH 27.5  26.0 - 34.0 pg   MCHC 33.9  30.0 - 36.0 g/dL   RDW 14.9  11.5 - 15.5 %   Platelets 319  150 - 400 K/uL   Neutrophils Relative % 61  43 - 77 %   Neutro Abs 6.5  1.7 - 7.7 K/uL   Lymphocytes Relative 32  12 - 46 %   Lymphs Abs 3.3  0.7 - 4.0 K/uL   Monocytes Relative 7  3 - 12 %   Monocytes Absolute 0.8  0.1 - 1.0 K/uL   Eosinophils Relative 0  0 - 5 %   Eosinophils Absolute 0.0  0.0 - 0.7 K/uL   Basophils Relative 0  0 - 1 %   Basophils Absolute 0.0  0.0 - 0.1 K/uL  COMPREHENSIVE METABOLIC PANEL     Status: Abnormal   Collection Time    02/03/14  1:05 PM      Result Value Ref Range   Sodium 140  137 - 147 mEq/L   Potassium 3.9  3.7 - 5.3 mEq/L   Chloride 98  96 - 112 mEq/L   CO2 24  19 - 32 mEq/L   Glucose, Bld 95  70 - 99 mg/dL   BUN 10  6 - 23 mg/dL   Creatinine, Ser 0.58  0.50 - 1.10 mg/dL   Calcium 10.0  8.4 - 10.5 mg/dL   Total Protein 8.4 (*) 6.0 - 8.3 g/dL   Albumin 4.0  3.5 - 5.2 g/dL   AST 15  0 - 37 U/L   ALT 12  0 - 35 U/L   Alkaline Phosphatase 93  39 - 117 U/L   Total Bilirubin 0.5  0.3 - 1.2 mg/dL   GFR calc non Af Amer >90  >90 mL/min   GFR calc Af Amer >90  >90 mL/min   Comment: (NOTE)     The eGFR has been calculated using the CKD EPI equation.  This calculation has not been validated in all clinical situations.     eGFR's persistently <90 mL/min signify possible Chronic Kidney     Disease.  POC URINE PREG, ED     Status: None   Collection Time    02/03/14  1:24 PM      Result Value Ref Range   Preg Test, Ur NEGATIVE  NEGATIVE   Comment:            THE SENSITIVITY OF THIS     METHODOLOGY IS >24 mIU/mL  CBG MONITORING, ED     Status: Abnormal   Collection Time    02/03/14  2:55 PM      Result Value Ref Range   Glucose-Capillary 50 (*) 70 - 99 mg/dL  CBG MONITORING, ED      Status: Abnormal   Collection Time    02/03/14  3:58 PM      Result Value Ref Range   Glucose-Capillary 129 (*) 70 - 99 mg/dL   Comment 1 Notify RN     Comment 2 Documented in Chart    GLUCOSE, CAPILLARY     Status: Abnormal   Collection Time    02/05/14 10:36 AM      Result Value Ref Range   Glucose-Capillary 147 (*) 70 - 99 mg/dL  LIPID PANEL     Status: Abnormal   Collection Time    02/05/14 11:24 AM      Result Value Ref Range   Cholesterol 229 (*) 0 - 200 mg/dL   Comment: ATP III Classification:           < 200        mg/dL        Desirable          200 - 239     mg/dL        Borderline High          >= 240        mg/dL        High         Triglycerides 145  <150 mg/dL   HDL 61  >39 mg/dL   Total CHOL/HDL Ratio 3.8     VLDL 29  0 - 40 mg/dL   LDL Cholesterol 139 (*) 0 - 99 mg/dL   Comment:       Total Cholesterol/HDL Ratio:CHD Risk                            Coronary Heart Disease Risk Table                                            Men       Women              1/2 Average Risk              3.4        3.3                  Average Risk              5.0        4.4               2X Average Risk  9.6        7.1               3X Average Risk             23.4       11.0     Use the calculated Patient Ratio above and the CHD Risk table      to determine the patient's CHD Risk.     ATP III Classification (LDL):           < 100        mg/dL         Optimal          100 - 129     mg/dL         Near or Above Optimal          130 - 159     mg/dL         Borderline High          160 - 189     mg/dL         High           > 190        mg/dL         Very High        GLUCOSE, CAPILLARY     Status: Abnormal   Collection Time    02/19/14  1:41 PM      Result Value Ref Range   Glucose-Capillary 133 (*) 70 - 99 mg/dL  GLUCOSE, CAPILLARY     Status: Abnormal   Collection Time    03/22/14  1:44 PM      Result Value Ref Range   Glucose-Capillary 100 (*) 70 - 99 mg/dL   LIPID PANEL     Status: Abnormal   Collection Time    03/22/14  2:38 PM      Result Value Ref Range   Cholesterol 220 (*) 0 - 200 mg/dL   Comment: ATP III Classification:           < 200        mg/dL        Desirable          200 - 239     mg/dL        Borderline High          >= 240        mg/dL        High         Triglycerides 132  <150 mg/dL   HDL 62  >39 mg/dL   Total CHOL/HDL Ratio 3.5     VLDL 26  0 - 40 mg/dL   LDL Cholesterol 132 (*) 0 - 99 mg/dL   Comment:       Total Cholesterol/HDL Ratio:CHD Risk                            Coronary Heart Disease Risk Table                                            Men       Women              1/2 Average Risk  3.4        3.3                  Average Risk              5.0        4.4               2X Average Risk              9.6        7.1               3X Average Risk             23.4       11.0     Use the calculated Patient Ratio above and the CHD Risk table      to determine the patient's CHD Risk.     ATP III Classification (LDL):           < 100        mg/dL         Optimal          100 - 129     mg/dL         Near or Above Optimal          130 - 159     mg/dL         Borderline High          160 - 189     mg/dL         High           > 190        mg/dL         Very High            Physical Exam: Constitutional:  BP 110/79  Pulse 111  Ht $R'5\' 3"'hP$  (1.6 m)  Wt 137 lb 6.4 oz (62.324 kg)  BMI 24.35 kg/m2  Musculoskeletal: Strength & Muscle Tone: within normal limits Gait & Station: normal Patient leans: N/A  Mental Status Examination;  Patient is a young female who is well groomed and well dressed. She appears anxious. She maintained fair eye contact. She describes her mood depressed and anxious and her affect is constricted.  She denies any active or passive suicidal partial homicidal thought. Her fund of knowledge is adequate. There were no tremors or shakes. She denies any hallucination, paranoia or any  delusions. There were no flight of ideas or any loose association. Her thought processes is logical and goal directed. Her speech is clear and coherent. She is alert and oriented x3. Her insight judgment and impulse control is okay.    Established Problem, Stable/Improving (1), Review of Psycho-Social Stressors (1), Review or order clinical lab tests (1), Established Problem, Worsening (2), Review of Last Therapy Session (1), Review of Medication Regimen & Side Effects (2) and Review of New Medication or Change in Dosage (2)  Assessment: Axis I: Maj. depressive disorder, recurrent  Axis II: Deferred  Axis III:  Past Medical History  Diagnosis Date  . Foot deformity, acquired     little toes, toes nail are falling off, needs surgery, but cant happen till AIC < 9  . Fatty liver   . Diabetic neuropathy 01/08/2012  . Depression 12/25/2011  . Diabetes type 1, uncontrolled 08/27/1997  . Hyperlipidemia LDL goal < 100 12/19/2011  . Superficial skin infection 12/18/2011    Recurrent for past 2 years   .  Insomnia 01/30/2012  . Chronic pain of right knee 02/2013    followed by Orthopedist Dr. Percell Miller    Axis IV: Mild to moderate   Plan:  I review blood work results, collateral information from primary care physician, and medication and psychosocial stressors.  The patient is tolerating Zoloft better.  I would increase Zoloft 150 mg daily and reduce Effexor 37.5 mg only.  I will discontinue Ativan since patient does not see any improvement and we will try Klonopin 0.5 mg twice a day to help anxiety and panic attacks.  Discussed in detail the risks and benefits of medication.  She is getting hydroxyzine from her primary care physician.  Discusses benzodiazepine dependence tolerance and withdrawal symptoms.  We will encourage to keep appointment with [facility] for counseling and therapy.  Recommended to call us back if she has any question of a concern.  I will see her again in 4 weeks. Time spent 25 minutes.   More than 50% of the time spent in psychoeducation, counseling and coordination of care.  Discuss safety plan that anytime having active suicidal thoughts or homicidal thoughts then patient need to call 911 or go to the local emergency room.    Mirabel Ahlgren T., MD 03/25/2014

## 2014-03-25 NOTE — Telephone Encounter (Signed)
Pharmacist left VM: Clarify Sertraline dose-RX states Sertraline 100 mg, take and 1/2 tablet, #45 Per note:Sertraline 100 mg Take 1.5 tablets  Informed pharmacist of note - corrected RX in chart

## 2014-03-25 NOTE — Telephone Encounter (Signed)
Opened in error

## 2014-03-27 ENCOUNTER — Other Ambulatory Visit (HOSPITAL_COMMUNITY): Payer: Self-pay | Admitting: Psychiatry

## 2014-03-29 ENCOUNTER — Encounter (HOSPITAL_COMMUNITY): Payer: Self-pay | Admitting: Professional Counselor

## 2014-03-29 ENCOUNTER — Ambulatory Visit (INDEPENDENT_AMBULATORY_CARE_PROVIDER_SITE_OTHER): Payer: No Typology Code available for payment source | Admitting: Professional Counselor

## 2014-03-29 DIAGNOSIS — F411 Generalized anxiety disorder: Secondary | ICD-10-CM

## 2014-03-29 NOTE — Progress Notes (Signed)
   THERAPIST PROGRESS NOTE  Session Time: 9:00am-9:50am  Participation Level: Active  Behavioral Response: CasualAlertEuphoric  Type of Therapy: Individual Therapy  Treatment Goals addressed: Anxiety  Interventions: CBT and Social Skills Training  Summary: Christine Dunn is a 23 y.o. female who presents with Euthymic mood and congruent affect. LCSW assessed patient's overall week reporting no new stressors, change in medication with psychiatrist and also with medical doctor working to get chest pain under control. Patient reports she went to the beach with a guy friend who could potentially turn into a romantic relationship. Patient reports they have been friends for years and he is one person she can trust. She expresses excitement about upcoming date and possibility of something positive.  LCSW focused mainly on topic of anxiety related to anger with mother. Patient processes past experiences of abandonment and mistrust in people and how that has affected her thinking to become negative and jump to negative conclusions even before the event.  LCSW processed reasons as to why patient jumps to conclusions and how this could be because of her situation growing up with mother.  Patient given worksheet regarding mind reading, jumping to conclusions, and predictive thinking to monitor thoughts and become more self aware of when she jumps before having evidence support and begin re framing thoughts to positive.   Suicidal/Homicidal: No. No intent or plan  Therapist Response: Assessed [name]'s overall mood and functionality regarding anxiety and depression. Patient reports decrease in panic attacks and anxiety and reports less stress in the past week.  Patient remains less involved socially then she would like to be and was encouraged to go out with female friend this next week. Patient discussed trust, abandonment, and negative feelings within relationships and how they relate to her relationship with her  mother.  Continues to improve weekly with therapy and using CBT as a way to reframed thinking and be more self aware of negative thinking.  In the upcoming week, patient wants more education regarding Bipolar Disorder, Anxiety and Depression.   Plan: Return again in 1 weeks.  Diagnosis: Axis I: Generalized Anxiety Disorder    Axis II: Deferred    Raye SorrowCoble, Kasheem Toner N, LCSW 03/29/2014

## 2014-03-31 ENCOUNTER — Other Ambulatory Visit (HOSPITAL_COMMUNITY): Payer: Self-pay | Admitting: Psychiatry

## 2014-03-31 NOTE — Telephone Encounter (Signed)
New script given. Too soon to refill.

## 2014-04-02 ENCOUNTER — Encounter: Payer: Self-pay | Admitting: Internal Medicine

## 2014-04-02 DIAGNOSIS — IMO0001 Reserved for inherently not codable concepts without codable children: Secondary | ICD-10-CM | POA: Insufficient documentation

## 2014-04-05 ENCOUNTER — Encounter (HOSPITAL_COMMUNITY): Payer: Self-pay | Admitting: Professional Counselor

## 2014-04-05 ENCOUNTER — Ambulatory Visit (INDEPENDENT_AMBULATORY_CARE_PROVIDER_SITE_OTHER): Payer: No Typology Code available for payment source | Admitting: Professional Counselor

## 2014-04-05 DIAGNOSIS — F411 Generalized anxiety disorder: Secondary | ICD-10-CM

## 2014-04-05 DIAGNOSIS — F330 Major depressive disorder, recurrent, mild: Secondary | ICD-10-CM

## 2014-04-05 NOTE — Progress Notes (Signed)
   THERAPIST PROGRESS NOTE  Session Time: 9:00am-9:50am  Participation Level: Active  Behavioral Response: CasualAlertEuthymic  Type of Therapy: Individual Therapy  Treatment Goals addressed: Anxiety  Interventions: DBT  Summary: Domino L Althia FortsMcAden is a 23 y.o. female who presents with positive mood and congruent affect.  Aizley begins therapy by recapping her week with regards to new stressors and positives that occurred. She reports she has started dating a new guy who is very supportive of her medical problems as well as personal needs.  Patient reports she and her mother have not spent a lot of time together and she continues to be angry.  Focus of session was primarily around how her anxiety relates to relationship with mother and anger being a result.  Patient lacks support from mother and is able to process past situations of abandonment and lack of support from mother causing current issues of resentment today.  LCSW was able to explore different problems and reasons behind her anger and still working to define triggers for anger.   Coping skills were discussed along with problem solution (DBT skills) to analyze the behavior.   Suicidal/Homicidal: Nowithout intent/plan  Therapist Response: LCSW continues to explore increased anxiety and anger with patient as it relates to her relationship with mother. Today, DBT was used to analyze behavior and look at chain events leading up to ineffective behaviors and the consequences being anger and resentment towards mother. Patient still working to understand how past experiences have affected her relationship and will work to set boundaries for self and also learn to make changes and either repair the damage between mother or accept relationship as it currently stands.  Scotlyn is making progress in therapy AEB opening up more about her relationships, owning her anger and understanding triggers related to distress.   Plan: Return again in 1  weeks.  Diagnosis: Axis I: Generalized Anxiety Disorder,  Major Depressive Disorder recurrent, mild    Axis II: Deferred    Raye SorrowCoble, Ayde Record N, LCSW 04/05/2014

## 2014-04-12 ENCOUNTER — Ambulatory Visit (INDEPENDENT_AMBULATORY_CARE_PROVIDER_SITE_OTHER): Payer: No Typology Code available for payment source | Admitting: Professional Counselor

## 2014-04-12 ENCOUNTER — Encounter (HOSPITAL_COMMUNITY): Payer: Self-pay | Admitting: Professional Counselor

## 2014-04-12 DIAGNOSIS — F411 Generalized anxiety disorder: Secondary | ICD-10-CM

## 2014-04-12 NOTE — Progress Notes (Signed)
   THERAPIST PROGRESS NOTE  Session Time: 9:00am-9:50am  Participation Level: Active  Behavioral Response: CasualAlertEuthymic  Type of Therapy: Individual Therapy  Treatment Goals addressed: Anxiety  Interventions: Solution Focused and Strength-based  Summary: Christine Dunn is a 23 y.o. female who presents with positive and bright affect.  [name] begins session discussing her previous week regarding starting school, her new relationship, and issues with her mother.  She continues to show improvement and made aware of her increased confidence and empowerment. LCSW gives examples of her going to school on her own accord to buy her books and not allowing anxiety to take charge along with fear of navigating the school and multiple people.  She is also planning to go on vacation starting tomorrow and expresses excitement of getting away and not being nervous about the trip or missing out on something at home.  Her new relationship has brought about positive feelings of self and support that has been lacking with her mother who does not always show consistent support.   Suicidal/Homicidal: Nowithout intent/plan  Therapist Response: LCSW reviewed with patient her overall improvement since beginning therapy.  Patient is much more engaged in discussing issues and more empowered with her daily life showing increase interest in school and a new relationship.  She remains self aware of her anxiety, but does not allow it to have control over her life or what she wants to do.  Reviewed coping mechanisms for going on vacation specifically with biofeedback and remembering to do things, but ultimately [name] shows improvement in taking ownership of daily tasks and not waiting to the last minute.    Plan: Return again in 2 weeks.  Diagnosis: Axis I: Generalized Anxiety Disorder    Axis II: Deferred    Raye SorrowCoble, Marsalis Beaulieu N, LCSW 04/12/2014

## 2014-04-18 IMAGING — CR DG KNEE COMPLETE 4+V*R*
4 series · 4 of 4 positions shown · non-contrast
Comparison: None

CLINICAL DATA: Knee pain

RIGHT KNEE - COMPLETE 4+ VIEW

[t knee ap right]
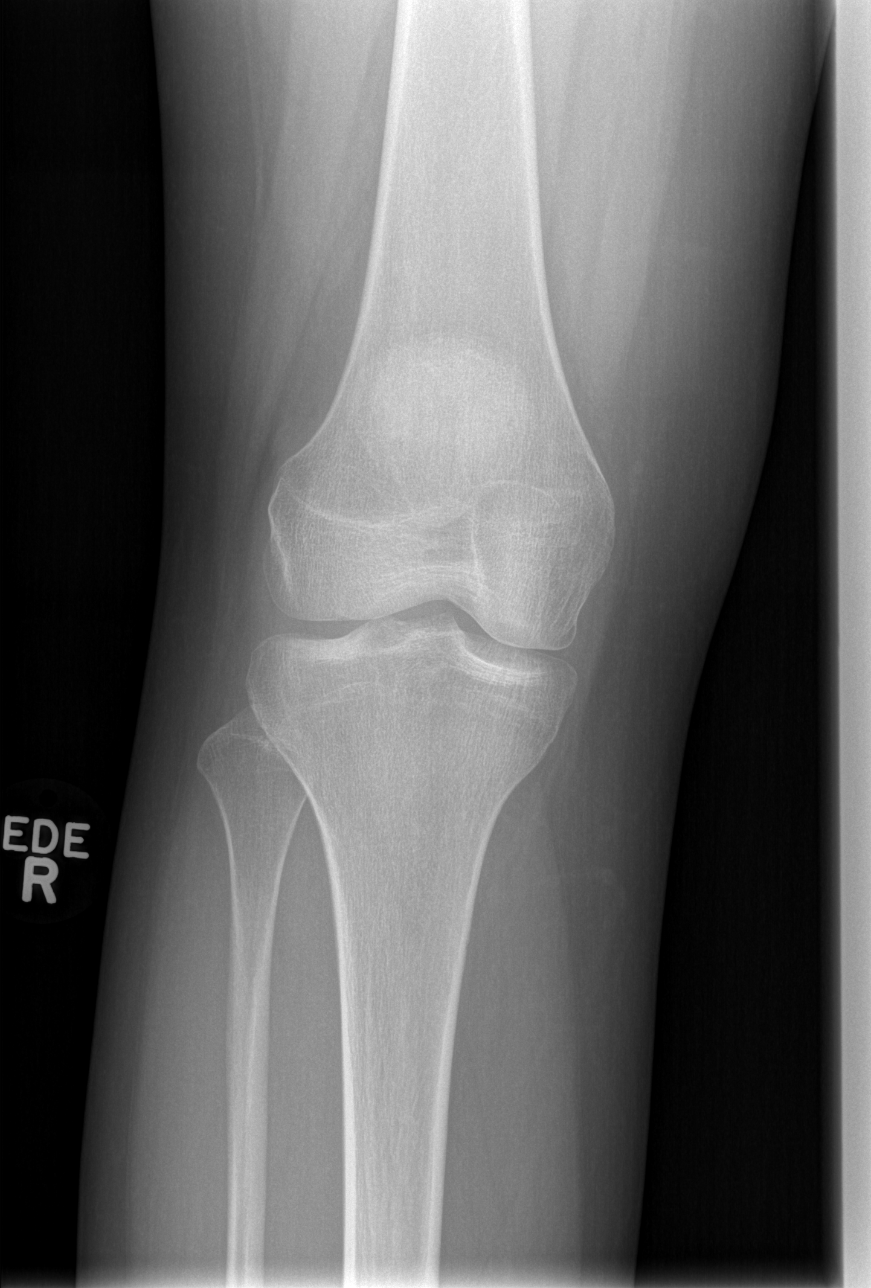

[t knee oblique right (1 of 2)]
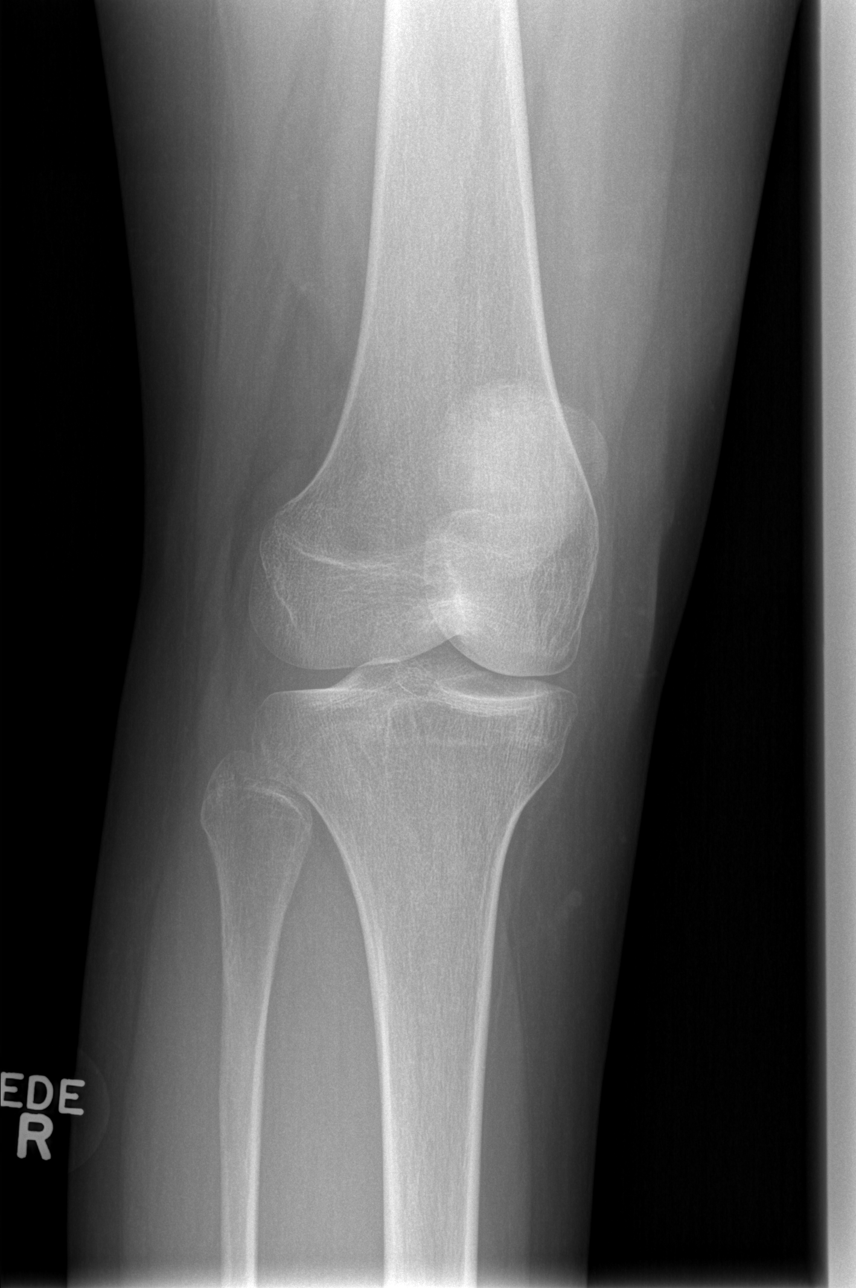

[t knee oblique right (2 of 2)]
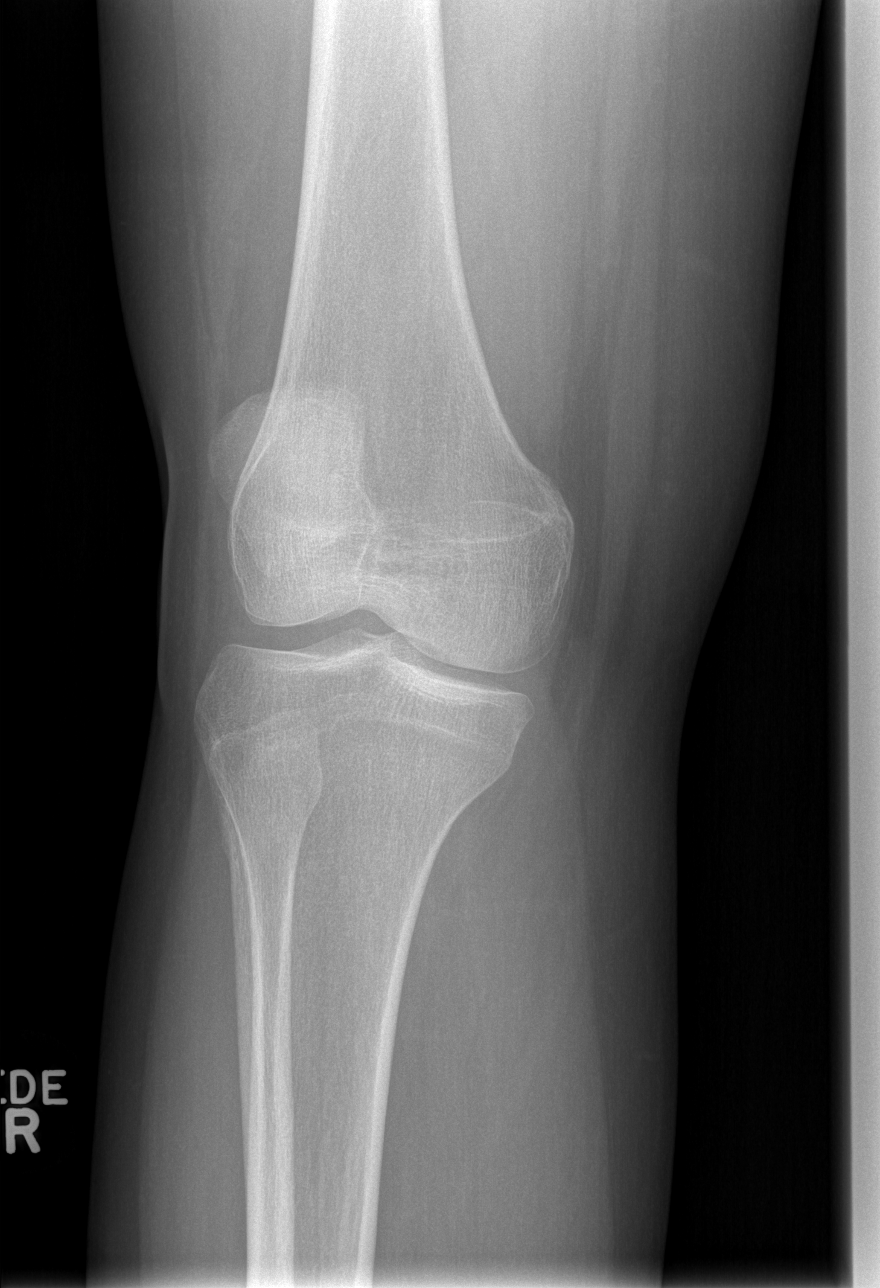

[t knee lat right]
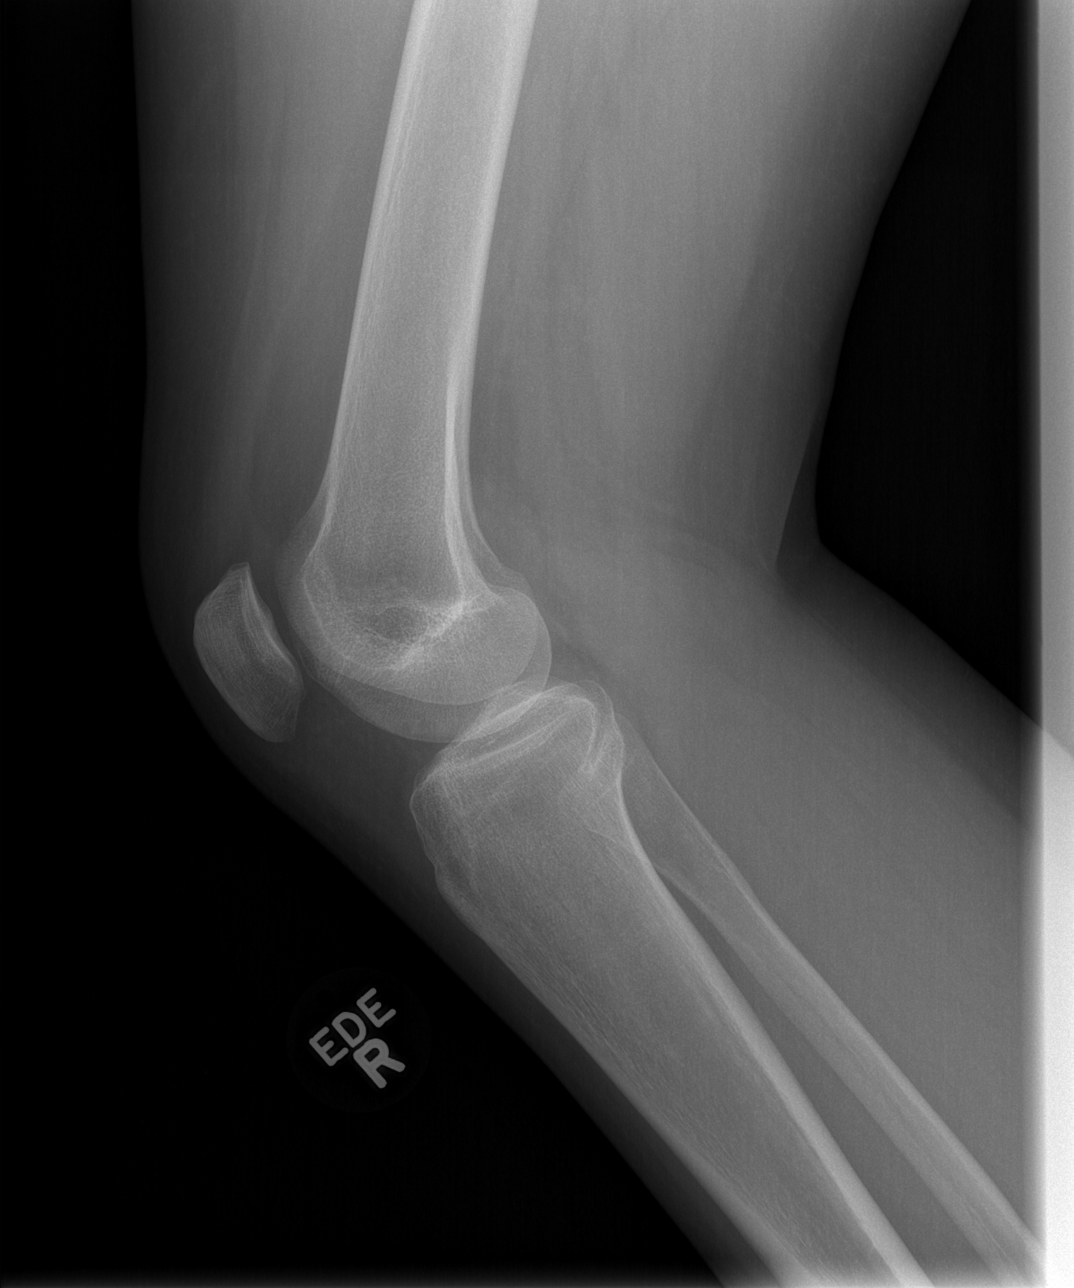

[4 of 4 positions shown; findings below may reference images not displayed]

FINDINGS: No fracture or dislocation of the left knee.  No joint
effusion.  No arthropathy.
IMPRESSION: No acute osseous abnormality.

## 2014-04-19 ENCOUNTER — Ambulatory Visit (HOSPITAL_COMMUNITY): Payer: Self-pay | Admitting: Professional Counselor

## 2014-04-26 ENCOUNTER — Encounter (INDEPENDENT_AMBULATORY_CARE_PROVIDER_SITE_OTHER): Payer: Self-pay

## 2014-04-26 ENCOUNTER — Ambulatory Visit (INDEPENDENT_AMBULATORY_CARE_PROVIDER_SITE_OTHER): Payer: No Typology Code available for payment source | Admitting: Professional Counselor

## 2014-04-26 ENCOUNTER — Encounter (HOSPITAL_COMMUNITY): Payer: Self-pay | Admitting: Professional Counselor

## 2014-04-26 DIAGNOSIS — F411 Generalized anxiety disorder: Secondary | ICD-10-CM
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
## 2014-04-27 ENCOUNTER — Ambulatory Visit (INDEPENDENT_AMBULATORY_CARE_PROVIDER_SITE_OTHER): Payer: No Typology Code available for payment source | Admitting: Psychiatry

## 2014-04-27 ENCOUNTER — Encounter (HOSPITAL_COMMUNITY): Payer: Self-pay | Admitting: Psychiatry

## 2014-04-27 VITALS — BP 133/89 | HR 108 | Ht 63.0 in | Wt 137.4 lb

## 2014-04-27 DIAGNOSIS — F331 Major depressive disorder, recurrent, moderate: Secondary | ICD-10-CM

## 2014-04-27 DIAGNOSIS — F339 Major depressive disorder, recurrent, unspecified: Secondary | ICD-10-CM

## 2014-04-27 MED ORDER — CLONAZEPAM 0.5 MG PO TABS: 0.5000 mg | ORAL_TABLET | Freq: Two times a day (BID) | ORAL | Status: DC

## 2014-04-27 MED ORDER — SERTRALINE HCL 100 MG PO TABS: ORAL_TABLET | ORAL | Status: DC

## 2014-04-27 NOTE — Progress Notes (Signed)
[facility] [phone] Progress Note  Christine Dunn [id number] 23 y.o.  04/27/2014 4:07 PM  Chief Complaint:  I am still anxious and nervous.  I have panic attacks.    History of Present Illness:  Christine Dunn came for her appointment.   On her last visit we increased Zoloft 150 mg and cut down her Effexor.  In the beginning she felt better however after a few days she started to have increased anxiety nervousness and panic attack.  She is concerned about her school.  She is enrolled in [employer] online courses.  She gets very anxious and nervous.  She also endorsed crying spells.  She endorsed her irritability and anger is much controlled.  Her appetite is okay.  She is sleeping better.  She has not been involved in any self-injurious behavior.  However she is concerned about her anxiety.  She endorsed not taking Klonopin every day twice a day because sometimes she feel it is making her sleepy.  She is not drinking or using any illegal substances.  Her appetite is okay.  Her vitals are stable however her pulse remains slightly high.  She is seeing therapist Orvil Feil for counseling.  However her therapist is going on maternity leave and she may be seeing a different therapist in the future.  Suicidal Ideation: No Plan Formed: No Patient has means to carry out plan: No  Homicidal Ideation: No Plan Formed: No Patient has means to carry out plan: No  Medical History; Patient has diabetes mellitus, neuropathy, hyperlipidemia.  Her primary care physician is [facility].   Past Psychiatric History/Hospitalization(s) Patient denies any history of suicidal attempt, inpatient psychiatric treatment, mania, psychosis or any hallucination.  She was seen by Dr. Dwyane Dee for depression a few years ago and prescribed Zoloft until 2 years ago it stopped working.  She endorses history of scratching herself causing injury to her skin. She denies any history of mood swing, aggression, violence or any impulsive  behavior. In the past she has taken trazodone but did not work. Anxiety: No Bipolar Disorder: No Depression: No Mania: No Psychosis: No Schizophrenia: No Personality Disorder: No Hospitalization for psychiatric illness: No History of Electroconvulsive Shock Therapy: No Prior Suicide Attempts: No   Review of Systems  Cardiovascular: Positive for palpitations.  Psychiatric/Behavioral: Positive for depression. The patient is nervous/anxious.    Psychiatric: Agitation: No Hallucination: No Depressed Mood: Yes Insomnia: Yes Hypersomnia: No Altered Concentration: No Feels Worthless: Yes Grandiose Ideas: No Belief In Special Powers: No New/Increased Substance Abuse: No Compulsions: No  Neurologic: Headache: No Seizure: No Paresthesias: Yes    Outpatient Encounter Prescriptions as of 04/27/2014  Medication Sig  . clonazePAM (KLONOPIN) 0.5 MG tablet Take 1 tablet (0.5 mg total) by mouth 2 (two) times daily.  Marland Kitchen esomeprazole (NEXIUM) 40 MG capsule Take 40 mg by mouth daily.   . hydrocortisone cream 1 % Apply to affected area 2 times daily  . hydrOXYzine (ATARAX/VISTARIL) 10 MG tablet Take 1 tablet (10 mg total) by mouth 3 (three) times daily as needed.  . insulin aspart (NOVOLOG) 100 UNIT/ML injection Inject 5 Units into the skin 3 (three) times daily before meals. Novolog TID. The goal of CBG is 150. Patient is to take additional 1 unit with every 50 of cbgs above 150. She is to take additional 1 unit for every 3 CHO per patient report( sees endocrinologist Dr.Smith).  . insulin glargine (LANTUS) 100 UNIT/ML injection Inject 50 Units into the skin at bedtime.   Marland Kitchen  ketoconazole (NIZORAL) 2 % shampoo Apply 1 application topically daily.  . medroxyPROGESTERone (DEPO-PROVERA) 150 MG/ML injection Inject 150 mg into the muscle every 3 (three) months.  . neomycin-bacitracin-polymyxin (NEOSPORIN) ointment Apply 1 application topically at bedtime. Apply to affected areas on arms  .  pregabalin (LYRICA) 50 MG capsule Take 50 mg by mouth 2 (two) times daily.  . sertraline (ZOLOFT) 100 MG tablet Take 2 tab daily (total dose 200 mg)  . [DISCONTINUED] clonazePAM (KLONOPIN) 0.5 MG tablet Take 1 tablet (0.5 mg total) by mouth 2 (two) times daily.  . [DISCONTINUED] sertraline (ZOLOFT) 100 MG tablet Take 1 and 1/2 tab daily (total dose 150 mg)  . [DISCONTINUED] venlafaxine XR (EFFEXOR-XR) 37.5 MG 24 hr capsule Take 1 capsule (37.5 mg total) by mouth daily.    Recent Results (from the past 2160 hour(s))  CBG MONITORING, ED     Status: Abnormal   Collection Time    02/03/14 12:42 PM      Result Value Ref Range   Glucose-Capillary 116 (*) 70 - 99 mg/dL   Comment 1 Notify RN     Comment 2 Documented in Chart    URINALYSIS, ROUTINE W REFLEX MICROSCOPIC     Status: Abnormal   Collection Time    02/03/14  1:01 PM      Result Value Ref Range   Color, Urine YELLOW  YELLOW   APPearance HAZY (*) CLEAR   Specific Gravity, Urine 1.027  1.005 - 1.030   pH 6.0  5.0 - 8.0   Glucose, UA >1000 (*) NEGATIVE mg/dL   Hgb urine dipstick NEGATIVE  NEGATIVE   Bilirubin Urine NEGATIVE  NEGATIVE   Ketones, ur 40 (*) NEGATIVE mg/dL   Protein, ur NEGATIVE  NEGATIVE mg/dL   Urobilinogen, UA 0.2  0.0 - 1.0 mg/dL   Nitrite NEGATIVE  NEGATIVE   Leukocytes, UA NEGATIVE  NEGATIVE  URINE MICROSCOPIC-ADD ON     Status: Abnormal   Collection Time    02/03/14  1:01 PM      Result Value Ref Range   Squamous Epithelial / LPF FEW (*) RARE   WBC, UA 0-2  <3 WBC/hpf   RBC / HPF 0-2  <3 RBC/hpf   Bacteria, UA FEW (*) RARE  CBC WITH DIFFERENTIAL     Status: Abnormal   Collection Time    02/03/14  1:05 PM      Result Value Ref Range   WBC 10.6 (*) 4.0 - 10.5 K/uL   RBC 5.42 (*) 3.87 - 5.11 MIL/uL   Hemoglobin 14.9  12.0 - 15.0 g/dL   HCT 44.0  36.0 - 46.0 %   MCV 81.2  78.0 - 100.0 fL   MCH 27.5  26.0 - 34.0 pg   MCHC 33.9  30.0 - 36.0 g/dL   RDW 14.9  11.5 - 15.5 %   Platelets 319  150 - 400 K/uL    Neutrophils Relative % 61  43 - 77 %   Neutro Abs 6.5  1.7 - 7.7 K/uL   Lymphocytes Relative 32  12 - 46 %   Lymphs Abs 3.3  0.7 - 4.0 K/uL   Monocytes Relative 7  3 - 12 %   Monocytes Absolute 0.8  0.1 - 1.0 K/uL   Eosinophils Relative 0  0 - 5 %   Eosinophils Absolute 0.0  0.0 - 0.7 K/uL   Basophils Relative 0  0 - 1 %   Basophils Absolute 0.0  0.0 - 0.1 K/uL  COMPREHENSIVE METABOLIC PANEL     Status: Abnormal   Collection Time    02/03/14  1:05 PM      Result Value Ref Range   Sodium 140  137 - 147 mEq/L   Potassium 3.9  3.7 - 5.3 mEq/L   Chloride 98  96 - 112 mEq/L   CO2 24  19 - 32 mEq/L   Glucose, Bld 95  70 - 99 mg/dL   BUN 10  6 - 23 mg/dL   Creatinine, Ser 0.58  0.50 - 1.10 mg/dL   Calcium 10.0  8.4 - 10.5 mg/dL   Total Protein 8.4 (*) 6.0 - 8.3 g/dL   Albumin 4.0  3.5 - 5.2 g/dL   AST 15  0 - 37 U/L   ALT 12  0 - 35 U/L   Alkaline Phosphatase 93  39 - 117 U/L   Total Bilirubin 0.5  0.3 - 1.2 mg/dL   GFR calc non Af Amer >90  >90 mL/min   GFR calc Af Amer >90  >90 mL/min   Comment: (NOTE)     The eGFR has been calculated using the CKD EPI equation.     This calculation has not been validated in all clinical situations.     eGFR's persistently <90 mL/min signify possible Chronic Kidney     Disease.  POC URINE PREG, ED     Status: None   Collection Time    02/03/14  1:24 PM      Result Value Ref Range   Preg Test, Ur NEGATIVE  NEGATIVE   Comment:            THE SENSITIVITY OF THIS     METHODOLOGY IS >24 mIU/mL  CBG MONITORING, ED     Status: Abnormal   Collection Time    02/03/14  2:55 PM      Result Value Ref Range   Glucose-Capillary 50 (*) 70 - 99 mg/dL  CBG MONITORING, ED     Status: Abnormal   Collection Time    02/03/14  3:58 PM      Result Value Ref Range   Glucose-Capillary 129 (*) 70 - 99 mg/dL   Comment 1 Notify RN     Comment 2 Documented in Chart    GLUCOSE, CAPILLARY     Status: Abnormal   Collection Time    02/05/14 10:36 AM       Result Value Ref Range   Glucose-Capillary 147 (*) 70 - 99 mg/dL  LIPID PANEL     Status: Abnormal   Collection Time    02/05/14 11:24 AM      Result Value Ref Range   Cholesterol 229 (*) 0 - 200 mg/dL   Comment: ATP III Classification:           < 200        mg/dL        Desirable          200 - 239     mg/dL        Borderline High          >= 240        mg/dL        High         Triglycerides 145  <150 mg/dL   HDL 61  >39 mg/dL   Total CHOL/HDL Ratio 3.8     VLDL 29  0 - 40 mg/dL   LDL Cholesterol 139 (*) 0 - 99 mg/dL  Comment:       Total Cholesterol/HDL Ratio:CHD Risk                            Coronary Heart Disease Risk Table                                            Men       Women              1/2 Average Risk              3.4        3.3                  Average Risk              5.0        4.4               2X Average Risk              9.6        7.1               3X Average Risk             23.4       11.0     Use the calculated Patient Ratio above and the CHD Risk table      to determine the patient's CHD Risk.     ATP III Classification (LDL):           < 100        mg/dL         Optimal          100 - 129     mg/dL         Near or Above Optimal          130 - 159     mg/dL         Borderline High          160 - 189     mg/dL         High           > 190        mg/dL         Very High        GLUCOSE, CAPILLARY     Status: Abnormal   Collection Time    02/19/14  1:41 PM      Result Value Ref Range   Glucose-Capillary 133 (*) 70 - 99 mg/dL  GLUCOSE, CAPILLARY     Status: Abnormal   Collection Time    03/22/14  1:44 PM      Result Value Ref Range   Glucose-Capillary 100 (*) 70 - 99 mg/dL  LIPID PANEL     Status: Abnormal   Collection Time    03/22/14  2:38 PM      Result Value Ref Range   Cholesterol 220 (*) 0 - 200 mg/dL   Comment: ATP III Classification:           < 200        mg/dL        Desirable          200 - 239     mg/dL  Borderline High           >= 240        mg/dL        High         Triglycerides 132  <150 mg/dL   HDL 62  >39 mg/dL   Total CHOL/HDL Ratio 3.5     VLDL 26  0 - 40 mg/dL   LDL Cholesterol 132 (*) 0 - 99 mg/dL   Comment:       Total Cholesterol/HDL Ratio:CHD Risk                            Coronary Heart Disease Risk Table                                            Men       Women              1/2 Average Risk              3.4        3.3                  Average Risk              5.0        4.4               2X Average Risk              9.6        7.1               3X Average Risk             23.4       11.0     Use the calculated Patient Ratio above and the CHD Risk table      to determine the patient's CHD Risk.     ATP III Classification (LDL):           < 100        mg/dL         Optimal          100 - 129     mg/dL         Near or Above Optimal          130 - 159     mg/dL         Borderline High          160 - 189     mg/dL         High           > 190        mg/dL         Very High            Physical Exam: Constitutional:  BP 133/89  Pulse 108  Ht _0  (1.6 m)  Wt 137 lb 6.4 oz (62.324 kg)  BMI 24.35 kg/m2  Musculoskeletal: Strength & Muscle Tone: within normal limits Gait & Station: normal Patient leans: N/A  Mental Status Examination;  Patient is well groomed and well dressed. She appears anxious. She maintained fair eye contact. She describes her mood depressed and anxious and her affect is constricted.  She denies any active or passive suicidal partial homicidal thought.  Her fund of knowledge is adequate. There were no tremors or shakes. She denies any hallucination, paranoia or any delusions. There were no flight of ideas or any loose association. Her thought processes is logical and goal directed. Her speech is clear and coherent. She is alert and oriented x3. Her insight judgment and impulse control is okay.    Established Problem, Stable/Improving (1), Review of Psycho-Social  Stressors (1), Established Problem, Worsening (2), Review of Last Therapy Session (1), Review of Medication Regimen & Side Effects (2) and Review of New Medication or Change in Dosage (2)  Assessment: Axis I: Maj. depressive disorder, recurrent  Axis II: Deferred  Axis III:  Past Medical History  Diagnosis Date  . Foot deformity, acquired     little toes, toes nail are falling off, needs surgery, but cant happen till AIC < 9  . Fatty liver   . Diabetic neuropathy 01/08/2012  . Depression 12/25/2011  . Diabetes type 1, uncontrolled 08/27/1997  . Hyperlipidemia LDL goal < 100 12/19/2011  . Superficial skin infection 12/18/2011    Recurrent for past 2 years   . Insomnia 01/30/2012  . Chronic pain of right knee 02/2013    followed by Orthopedist Dr. Percell Miller    Axis IV: Mild to moderate   Plan:  I. recommended to take Klonopin 0.5 mg twice a day as prescribed and I will also increase Zoloft to 200 mg daily.  In the past she had a good response with Zoloft.  I will discontinue Effexor.  Patient is anxious because of the school and because her therapist is going on maternity leave.  Recommended to give more time to Klonopin however if she continues to have anxiety spells and we will consider switching her back on Ativan.  Recommended to call us back if she has any question of a concern.  I will see her again in 4 weeks. Time spent 25 minutes.  More than 50% of the time spent in psychoeducation, counseling and coordination of care.  Discuss safety plan that anytime having active suicidal thoughts or homicidal thoughts then patient need to call 911 or go to the local emergency room.    Jeane Cashatt T., MD 04/27/2014

## 2014-05-04 ENCOUNTER — Encounter (INDEPENDENT_AMBULATORY_CARE_PROVIDER_SITE_OTHER): Payer: Self-pay

## 2014-05-04 ENCOUNTER — Ambulatory Visit (INDEPENDENT_AMBULATORY_CARE_PROVIDER_SITE_OTHER): Payer: No Typology Code available for payment source | Admitting: Professional Counselor

## 2014-05-04 ENCOUNTER — Encounter (HOSPITAL_COMMUNITY): Payer: Self-pay | Admitting: Professional Counselor

## 2014-05-04 DIAGNOSIS — F331 Major depressive disorder, recurrent, moderate: Secondary | ICD-10-CM
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
## 2014-05-10 ENCOUNTER — Ambulatory Visit (HOSPITAL_COMMUNITY): Payer: Self-pay | Admitting: Professional Counselor

## 2014-05-18 ENCOUNTER — Encounter (HOSPITAL_COMMUNITY): Payer: Self-pay | Admitting: Psychiatry

## 2014-05-18 NOTE — Progress Notes (Signed)
Patient is no show This encounter was created in error - please disregard. 

## 2014-05-21 ENCOUNTER — Other Ambulatory Visit (HOSPITAL_COMMUNITY): Payer: Self-pay | Admitting: *Deleted

## 2014-05-21 NOTE — Telephone Encounter (Signed)
Faxed request for refill of Effexor, chart reviewed refill not appropriate as it was discontinued 04/27/14 by Dr. Lolly Mustache. Pharmacy notified.

## 2014-06-04 ENCOUNTER — Encounter: Payer: Self-pay | Admitting: *Deleted

## 2014-06-09 ENCOUNTER — Ambulatory Visit (HOSPITAL_COMMUNITY): Payer: Self-pay | Admitting: Psychiatry

## 2014-06-09 DIAGNOSIS — L659 Nonscarring hair loss, unspecified: Secondary | ICD-10-CM | POA: Insufficient documentation

## 2014-06-09 DIAGNOSIS — K7581 Nonalcoholic steatohepatitis (NASH): Secondary | ICD-10-CM | POA: Insufficient documentation

## 2014-06-09 DIAGNOSIS — IMO0002 Reserved for concepts with insufficient information to code with codable children: Secondary | ICD-10-CM | POA: Insufficient documentation

## 2014-06-09 DIAGNOSIS — F329 Major depressive disorder, single episode, unspecified: Secondary | ICD-10-CM | POA: Insufficient documentation

## 2014-06-09 DIAGNOSIS — R5383 Other fatigue: Secondary | ICD-10-CM | POA: Insufficient documentation

## 2014-06-09 DIAGNOSIS — R7989 Other specified abnormal findings of blood chemistry: Secondary | ICD-10-CM | POA: Insufficient documentation

## 2014-06-09 DIAGNOSIS — F419 Anxiety disorder, unspecified: Secondary | ICD-10-CM | POA: Insufficient documentation

## 2014-06-09 DIAGNOSIS — K219 Gastro-esophageal reflux disease without esophagitis: Secondary | ICD-10-CM | POA: Insufficient documentation

## 2014-06-09 DIAGNOSIS — R809 Proteinuria, unspecified: Secondary | ICD-10-CM | POA: Insufficient documentation

## 2014-06-09 DIAGNOSIS — H905 Unspecified sensorineural hearing loss: Secondary | ICD-10-CM | POA: Insufficient documentation

## 2014-06-09 DIAGNOSIS — I10 Essential (primary) hypertension: Secondary | ICD-10-CM | POA: Insufficient documentation

## 2014-06-09 DIAGNOSIS — R945 Abnormal results of liver function studies: Secondary | ICD-10-CM | POA: Insufficient documentation

## 2014-06-09 DIAGNOSIS — R160 Hepatomegaly, not elsewhere classified: Secondary | ICD-10-CM | POA: Insufficient documentation

## 2014-06-09 DIAGNOSIS — E1065 Type 1 diabetes mellitus with hyperglycemia: Secondary | ICD-10-CM | POA: Insufficient documentation

## 2014-06-09 DIAGNOSIS — F32A Depression, unspecified: Secondary | ICD-10-CM | POA: Insufficient documentation

## 2014-06-09 DIAGNOSIS — N912 Amenorrhea, unspecified: Secondary | ICD-10-CM | POA: Insufficient documentation

## 2014-06-09 DIAGNOSIS — R002 Palpitations: Secondary | ICD-10-CM | POA: Insufficient documentation

## 2014-06-18 ENCOUNTER — Ambulatory Visit (INDEPENDENT_AMBULATORY_CARE_PROVIDER_SITE_OTHER): Payer: No Typology Code available for payment source | Admitting: Psychiatry

## 2014-06-18 ENCOUNTER — Encounter (HOSPITAL_COMMUNITY): Payer: Self-pay | Admitting: Psychiatry

## 2014-06-18 VITALS — BP 109/76 | HR 103 | Ht 63.0 in | Wt 135.8 lb

## 2014-06-18 DIAGNOSIS — F339 Major depressive disorder, recurrent, unspecified: Secondary | ICD-10-CM

## 2014-06-18 DIAGNOSIS — F331 Major depressive disorder, recurrent, moderate: Secondary | ICD-10-CM

## 2014-06-18 MED ORDER — SERTRALINE HCL 100 MG PO TABS: ORAL_TABLET | ORAL | Status: DC

## 2014-06-18 MED ORDER — CLONAZEPAM 0.5 MG PO TABS: 0.5000 mg | ORAL_TABLET | Freq: Two times a day (BID) | ORAL | Status: DC

## 2014-06-18 NOTE — Progress Notes (Signed)
[facility] [id number] Progress Note  Christine Dunn [id number] 23 y.o.  06/18/2014 10:34 AM  Chief Complaint:  I am taking pain medication for my toothache.  My depression is better.    History of Present Illness:  Christine Dunn came for her appointment.   She is taking Zoloft 200 mg daily and Klonopin 0.5 mg twice a day.  Her depression has improved from the past.  She denies any major panic attack in recent months.  She is complaining of tooth pain and she is taking control pain medication.  Today she had some difficulty in talking because of the pain.  However she is sleeping better.  She denies any irritability, crying spells, panic attack or feelings of hopelessness.  She denies any self-injurious behavior in recent months.  She seen Dahlia Client for counseling but now she is on maternity leave and she will start seeing [facility].  Overall her mood has been stable.  She gets overwhelmed sometime because at the school. She is enrolled in [facility] [employer] online courses.  She is not drinking or using any illegal substances.  Her appetite is okay.  Her vitals are stable however her pulse remains slightly high.  She is checking her blood sugar and she is happy it is under control.  Suicidal Ideation: No Plan Formed: No Patient has means to carry out plan: No  Homicidal Ideation: No Plan Formed: No Patient has means to carry out plan: No  Medical History; Patient has diabetes mellitus, neuropathy, hyperlipidemia.  Her primary care physician is [facility].   Past Psychiatric History/Hospitalization(s) Patient denies any history of suicidal attempt, inpatient psychiatric treatment, mania, psychosis or any hallucination.  She was seen by [name] for depression a few years ago and prescribed Zoloft until 2 years ago it stopped working.  She endorses history of scratching herself causing injury to her skin. She denies any history of mood swing, aggression, violence or any impulsive behavior. In the past  she has taken trazodone but did not work. Anxiety: No Bipolar Disorder: No Depression: No Mania: No Psychosis: No Schizophrenia: No Personality Disorder: No Hospitalization for psychiatric illness: No History of Electroconvulsive Shock Therapy: No Prior Suicide Attempts: No   Review of Systems  HENT:       Tooth ache  Cardiovascular: Positive for palpitations.  Skin: Negative for itching and rash.  Psychiatric/Behavioral: The patient is nervous/anxious.    Psychiatric: Agitation: No Hallucination: No Depressed Mood: No Insomnia: No Hypersomnia: No Altered Concentration: No Feels Worthless: No Grandiose Ideas: No Belief In Special Powers: No New/Increased Substance Abuse: No Compulsions: No  Neurologic: Headache: No Seizure: No Paresthesias: Yes    Outpatient Encounter Prescriptions as of 06/18/2014  Medication Sig  . clonazePAM (KLONOPIN) 0.5 MG tablet Take 1 tablet (0.5 mg total) by mouth 2 (two) times daily.  Marland Kitchen esomeprazole (NEXIUM) 40 MG capsule Take 40 mg by mouth daily.   . hydrocortisone cream 1 % Apply to affected area 2 times daily  . hydrOXYzine (ATARAX/VISTARIL) 10 MG tablet Take 1 tablet (10 mg total) by mouth 3 (three) times daily as needed.  . insulin aspart (NOVOLOG) 100 UNIT/ML injection Inject 5 Units into the skin 3 (three) times daily before meals. Novolog TID. The goal of CBG is 150. Patient is to take additional 1 unit with every 50 of cbgs above 150. She is to take additional 1 unit for every 3 CHO per patient report( sees endocrinologist Dr.Smith).  . insulin glargine (LANTUS) 100 UNIT/ML injection Inject  50 Units into the skin at bedtime.   Marland Kitchen. ketoconazole (NIZORAL) 2 % shampoo Apply 1 application topically daily.  . medroxyPROGESTERone (DEPO-PROVERA) 150 MG/ML injection Inject 150 mg into the muscle every 3 (three) months.  . neomycin-bacitracin-polymyxin (NEOSPORIN) ointment Apply 1 application topically at bedtime. Apply to affected areas on  arms  . pregabalin (LYRICA) 50 MG capsule Take 50 mg by mouth 2 (two) times daily.  . sertraline (ZOLOFT) 100 MG tablet Take 2 tab daily (total dose 200 mg)  . [DISCONTINUED] clonazePAM (KLONOPIN) 0.5 MG tablet Take 1 tablet (0.5 mg total) by mouth 2 (two) times daily.  . [DISCONTINUED] sertraline (ZOLOFT) 100 MG tablet Take 2 tab daily (total dose 200 mg)    Recent Results (from the past 2160 hour(s))  GLUCOSE, CAPILLARY     Status: Abnormal   Collection Time    03/22/14  1:44 PM      Result Value Ref Range   Glucose-Capillary 100 (*) 70 - 99 mg/dL  LIPID PANEL     Status: Abnormal   Collection Time    03/22/14  2:38 PM      Result Value Ref Range   Cholesterol 220 (*) 0 - 200 mg/dL   Comment: ATP III Classification:           < 200        mg/dL        Desirable          200 - 239     mg/dL        Borderline High          >= 240        mg/dL        High         Triglycerides 132  <150 mg/dL   HDL 62  >16>39 mg/dL   Total CHOL/HDL Ratio 3.5     VLDL 26  0 - 40 mg/dL   LDL Cholesterol 109132 (*) 0 - 99 mg/dL   Comment:       Total Cholesterol/HDL Ratio:CHD Risk                            Coronary Heart Disease Risk Table                                            Men       Women              1/2 Average Risk              3.4        3.3                  Average Risk              5.0        4.4               2X Average Risk              9.6        7.1               3X Average Risk             23.4       11.0     Use the calculated  Patient Ratio above and the CHD Risk table      to determine the patient's CHD Risk.     ATP III Classification (LDL):           < 100        mg/dL         Optimal          100 - 129     mg/dL         Near or Above Optimal          130 - 159     mg/dL         Borderline High          160 - 189     mg/dL         High           > 190        mg/dL         Very High            Physical Exam: Constitutional:  BP 109/76  Pulse 103  Ht 5\' 3"  (1.6 m)   Wt 135 lb 12.8 oz (61.598 kg)  BMI 24.06 kg/m2  Musculoskeletal: Strength & Muscle Tone: within normal limits Gait & Station: normal Patient leans: N/A  Mental Status Examination;  Patient is well groomed and well dressed. She appears anxious but cooperative. She maintained fair eye contact. She describes her mood euthymic and her affect is appropriate.  She denies any active or passive suicidal partial homicidal thought. Her fund of knowledge is adequate. There were no tremors or shakes. She denies any hallucination, paranoia or any delusions. There were no flight of ideas or any loose association. Her thought processes is logical and goal directed. Her speech is clear and coherent. She is alert and oriented x3. Her insight judgment and impulse control is okay.    Established Problem, Stable/Improving (1), Review of Psycho-Social Stressors (1), Review and summation of old records (2), Review of Last Therapy Session (1) and Review of Medication Regimen & Side Effects (2)  Assessment: Axis I: Maj. depressive disorder, recurrent  Axis II: Deferred  Axis III:  Past Medical History  Diagnosis Date  . Foot deformity, acquired     little toes, toes nail are falling off, needs surgery, but cant happen till AIC < 9  . Fatty liver   . Diabetic neuropathy 01/08/2012  . Depression 12/25/2011  . Diabetes type 1, uncontrolled 08/27/1997  . Hyperlipidemia LDL goal < 100 12/19/2011  . Superficial skin infection 12/18/2011    Recurrent for past 2 years   . Insomnia 01/30/2012  . Chronic pain of right knee 02/2013    followed by Orthopedist Dr. Eulah Pont    Axis IV: Mild to moderate   Plan:  Patient is improving from the past.  She is taking Klonopin twice a day which is helping her anxiety and nervousness.  Recommended to continue 0.5 mg twice a day Klonopin and Zoloft 200 mg daily.  Encouraged to keep appointment with a therapist for coping and social skills.  She will see [name] since Dahlia Client is going  on maternity leave.  At this time she does not have any side effects of the medication.  Recommended to call us back if she has any question of a concern.  I will see her again in 4 weeks. Time spent 25 minutes.  More than 50% of the time spent in psychoeducation, counseling and coordination of care.  Discuss safety plan that anytime having active suicidal thoughts or homicidal thoughts then patient need to call 911 or go to the local emergency room.    Menashe Kafer T., MD 06/18/2014

## 2014-07-02 ENCOUNTER — Encounter: Payer: Self-pay | Admitting: Internal Medicine

## 2014-08-02 DIAGNOSIS — L989 Disorder of the skin and subcutaneous tissue, unspecified: Secondary | ICD-10-CM | POA: Insufficient documentation

## 2014-08-23 ENCOUNTER — Ambulatory Visit (INDEPENDENT_AMBULATORY_CARE_PROVIDER_SITE_OTHER): Payer: Medicaid Other | Admitting: Podiatry

## 2014-08-23 ENCOUNTER — Ambulatory Visit (INDEPENDENT_AMBULATORY_CARE_PROVIDER_SITE_OTHER): Payer: Medicaid Other

## 2014-08-23 ENCOUNTER — Other Ambulatory Visit: Payer: Self-pay | Admitting: *Deleted

## 2014-08-23 VITALS — BP 118/78 | HR 100 | Resp 16

## 2014-08-23 DIAGNOSIS — M2040 Other hammer toe(s) (acquired), unspecified foot: Secondary | ICD-10-CM

## 2014-08-23 NOTE — Progress Notes (Signed)
She presents today for surgical consult regarding her fifth toes bilateral foot. She states that her most recent hemoglobin A1c was an 8.3. She does state that she is continuing to follow a cardiac tachyarrhythmia with her endocrinologist.  Objective: Vital signs are stable she is alert and oriented 3 pulses are strongly palpable bilateral. Neurologic sensorium is intact per Semmes-Weinstein monofilament. Deep tendon reflexes are intact bilateral muscle strength is 5 over 5 dorsiflexion plantar flexors and inverters everters on physical musculature is intact. Orthopedic evaluation demonstrates adductor varus rotated hammertoe deformities which are tender on palpation particularly overlying the lateral dorsal cutaneous digital nerve of each toe. Radiographic evaluation demonstrates adductor varus rotated hammertoe deformities. Cutaneous evaluation demonstrates skin irritation and skin breakdown that are not ulcerated this does appear to be a self-induced tight irritation.  Assessment: Insulin-dependent diabetes mellitus with adductor varus rotated hammertoe deformity and hemoglobin A1c of 8.3.  Plan: We requested medical clearance for an elective surgery consisting of a derotational arthroplasty fifth toes bilateral. We went over consent form today lamina line number by number giving ample time to ask questions she saw fit regarding the derotational arthroplasty fifth toes bilateral. I discussed the possible postoperative complications which may include but are not limited to postop pain bleeding as well as infection recurrence need for further surgery balsam digit loss of limb and loss of life. She understands this and is amenable to it. We will await medical clearance from her endocrinologist for her cardiac tachyarrhythmia and advisement of her hemoglobin A1c.

## 2014-08-31 ENCOUNTER — Other Ambulatory Visit (HOSPITAL_COMMUNITY): Payer: Self-pay | Admitting: *Deleted

## 2014-08-31 NOTE — Telephone Encounter (Signed)
Pt called for Refill of Klonopin and Zoloft. To soon to refill pt medication

## 2014-09-01 ENCOUNTER — Other Ambulatory Visit (HOSPITAL_COMMUNITY): Payer: Self-pay | Admitting: Psychiatry

## 2014-09-08 ENCOUNTER — Other Ambulatory Visit (HOSPITAL_COMMUNITY): Payer: Self-pay | Admitting: Psychiatry

## 2014-09-08 ENCOUNTER — Other Ambulatory Visit (HOSPITAL_COMMUNITY): Payer: Self-pay

## 2014-09-08 DIAGNOSIS — F331 Major depressive disorder, recurrent, moderate: Secondary | ICD-10-CM

## 2014-09-08 MED ORDER — SERTRALINE HCL 100 MG PO TABS: ORAL_TABLET | ORAL | Status: DC

## 2014-09-08 MED ORDER — CLONAZEPAM 0.5 MG PO TABS: 0.5000 mg | ORAL_TABLET | Freq: Two times a day (BID) | ORAL | Status: DC

## 2014-09-08 NOTE — Telephone Encounter (Signed)
Klonopin called in and Zoloft sent electronically one time refills as approved by Dr. Lolly MustacheArfeen to last patient until their next scheduled evaluation.

## 2014-09-20 ENCOUNTER — Ambulatory Visit (HOSPITAL_COMMUNITY): Payer: Self-pay | Admitting: Psychiatry

## 2014-09-22 ENCOUNTER — Encounter (HOSPITAL_COMMUNITY): Payer: Self-pay | Admitting: Psychiatry

## 2014-09-22 ENCOUNTER — Ambulatory Visit (INDEPENDENT_AMBULATORY_CARE_PROVIDER_SITE_OTHER): Payer: Federal, State, Local not specified - Other | Admitting: Psychiatry

## 2014-09-22 VITALS — BP 119/77 | HR 98 | Ht 63.5 in | Wt 136.4 lb

## 2014-09-22 DIAGNOSIS — F339 Major depressive disorder, recurrent, unspecified: Secondary | ICD-10-CM

## 2014-09-22 DIAGNOSIS — F331 Major depressive disorder, recurrent, moderate: Secondary | ICD-10-CM

## 2014-09-22 MED ORDER — CLONAZEPAM 0.5 MG PO TABS: 0.5000 mg | ORAL_TABLET | Freq: Two times a day (BID) | ORAL | Status: DC

## 2014-09-22 MED ORDER — SERTRALINE HCL 100 MG PO TABS: ORAL_TABLET | ORAL | Status: DC

## 2014-09-22 NOTE — Progress Notes (Signed)
[facility] [id number] Progress Note  Christine Dunn [id number] 23 y.o.  09/22/2014 9:37 AM  Chief Complaint:  I'm taking a new medication for diabetes.  My anxiety and depression is better    History of Present Illness:  Christine Dunn came for her appointment.   She is compliant with Zoloft 200 mg daily and Klonopin 0.5 mg twice a day.  She denies any major panic attack however she continued to endorse feeling anxious, nervous and some time having crying spells.  She is hoping to graduate this May and she admitted school has been stressful.  She is enrolled in [employer] online courses.  Some nights she has unable to sleep very well but overall she feels that Klonopin and Zoloft working very well for his anxiety and depression.  She saw her endocrinologist Dr. Corwin Levins on December 21 and her hemoglobin A1c is about 8.  She was disappointed because her hemoglobin A1c is increased but now she is taking new medication INVOKANA and she has noticed her blood sugar has been gradually decreased.  She scheduled to have another hemoglobin A1c in 6 weeks.  She is seeing Christine Herter every 2 weeks.  Patient is also concerned about upcoming fifth toe bilateral foot surgery.  She had surgical evaluation and she was required to have medical clearance before surgery.  She had stopped taking Vistaril and Lyrica because she seen improvement with the new diabetes medication.  She continues to take amitriptyline 10 mg which is prescribed by her primary care physician.  Patient denies any paranoia, hallucination, aggression or violence.  She has no tremors or shakes.  She still has neuropathy but it is getting better.  Her appetite is okay.  Her vitals are stable.  Patient denies any feeling of hopelessness or worthlessness.  She denies any self abusive behavior.  Patient denies drinking alcohol or using any illegal substances.  Suicidal Ideation: No Plan Formed: No Patient has means to carry out plan: No  Homicidal  Ideation: No Plan Formed: No Patient has means to carry out plan: No  Medical History; Patient has diabetes mellitus, neuropathy, hyperlipidemia.  Her primary care physician is [facility].   Past Psychiatric History/Hospitalization(s) Patient denies any history of suicidal attempt, inpatient psychiatric treatment, mania, psychosis or any hallucination.  She was seen by [name] for depression a few years ago and prescribed Zoloft until 2 years ago it stopped working.  She endorses history of scratching herself causing injury to her skin. She denies any history of mood swing, aggression, violence or any impulsive behavior. In the past she has taken trazodone but did not work. Anxiety: No Bipolar Disorder: No Depression: No Mania: No Psychosis: No Schizophrenia: No Personality Disorder: No Hospitalization for psychiatric illness: No History of Electroconvulsive Shock Therapy: No Prior Suicide Attempts: No   Review of Systems  Skin: Negative for itching and rash.  Neurological:       Numbness  Psychiatric/Behavioral: Negative for suicidal ideas and substance abuse. The patient is nervous/anxious.    Psychiatric: Agitation: No Hallucination: No Depressed Mood: No Insomnia: No Hypersomnia: No Altered Concentration: No Feels Worthless: No Grandiose Ideas: No Belief In Special Powers: No New/Increased Substance Abuse: No Compulsions: No  Neurologic: Headache: No Seizure: No Paresthesias: Yes    Outpatient Encounter Prescriptions as of 09/22/2014  Medication Sig  . amitriptyline (ELAVIL) 10 MG tablet Take 1-2 tabs po qhs  . canagliflozin (INVOKANA) 100 MG TABS tablet   . clonazePAM (KLONOPIN) 0.5 MG tablet Take  1 tablet (0.5 mg total) by mouth 2 (two) times daily.  Marland Kitchen esomeprazole (NEXIUM) 40 MG capsule Take 40 mg by mouth daily.   . hydrocortisone cream 1 % Apply to affected area 2 times daily  . insulin aspart (NOVOLOG) 100 UNIT/ML injection Inject 5 Units  into the skin 3 (three) times daily before meals. Novolog TID. The goal of CBG is 150. Patient is to take additional 1 unit with every 50 of cbgs above 150. She is to take additional 1 unit for every 3 CHO per patient report( sees endocrinologist Dr.Smith).  . insulin glargine (LANTUS) 100 UNIT/ML injection Inject 50 Units into the skin at bedtime.   Marland Kitchen ketoconazole (NIZORAL) 2 % shampoo Apply 1 application topically daily.  . medroxyPROGESTERone (DEPO-PROVERA) 150 MG/ML injection Inject 150 mg into the muscle every 3 (three) months.  . metoprolol succinate (TOPROL XL) 25 MG 24 hr tablet Take 25 mg by mouth.  . neomycin-bacitracin-polymyxin (NEOSPORIN) ointment Apply 1 application topically at bedtime. Apply to affected areas on arms  . sertraline (ZOLOFT) 100 MG tablet Take 2 tab daily (total dose 200 mg)  . [DISCONTINUED] clonazePAM (KLONOPIN) 0.5 MG tablet Take 1 tablet (0.5 mg total) by mouth 2 (two) times daily.  . [DISCONTINUED] hydrOXYzine (ATARAX/VISTARIL) 10 MG tablet Take 1 tablet (10 mg total) by mouth 3 (three) times daily as needed.  . [DISCONTINUED] pregabalin (LYRICA) 50 MG capsule Take 50 mg by mouth 2 (two) times daily.  . [DISCONTINUED] sertraline (ZOLOFT) 100 MG tablet Take 2 tab daily (total dose 200 mg)    No results found for this or any previous visit (from the past 2160 hour(s)).    Physical Exam: Constitutional:  BP 119/77 mmHg  Pulse 98  Ht 5' 3.5" (1.613 m)  Wt 136 lb 6.4 oz (61.871 kg)  BMI 23.78 kg/m2  Musculoskeletal: Strength & Muscle Tone: within normal limits Gait & Station: normal Patient leans: N/A  Mental Status Examination;  Patient is well groomed and well dressed. She appears anxious but cooperative. She maintained fair eye contact. She describes her mood euthymic and her affect is appropriate.  She denies any active or passive suicidal partial homicidal thought. Her fund of knowledge is adequate. There were no tremors or shakes. She denies any  hallucination, paranoia or any delusions. There were no flight of ideas or any loose association. Her thought processes is logical and goal directed. Her speech is clear and coherent. She is alert and oriented x3. Her insight judgment and impulse control is okay.    Established Problem, Stable/Improving (1), Review of Psycho-Social Stressors (1), Review or order clinical lab tests (1), Review and summation of old records (2), Review of Last Therapy Session (1) and Review of Medication Regimen & Side Effects (2)  Assessment: Axis I: Maj. depressive disorder, recurrent  Axis II: Deferred  Axis III:  Past Medical History  Diagnosis Date  . Foot deformity, acquired     little toes, toes nail are falling off, needs surgery, but cant happen till AIC < 9  . Fatty liver   . Diabetic neuropathy 01/08/2012  . Depression 12/25/2011  . Diabetes type 1, uncontrolled 08/27/1997  . Hyperlipidemia LDL goal < 100 12/19/2011  . Superficial skin infection 12/18/2011    Recurrent for past 2 years   . Insomnia 01/30/2012  . Chronic pain of right knee 02/2013    followed by Orthopedist Dr. Eulah Pont    Plan:  I review collateral information , stressors, current medication and her  blood results.  She mentioned her hemoglobin A1c is above 8.  However she is hoping new diabetes medication will bring it down.  She still have residual anxiety but she is not interested to increased her medication.  I will continue Klonopin 0.5 mg twice a day and Zoloft 200 mg daily.  Patient is getting amitriptyline 10 mg from her primary care physician.  I encouraged to keep appointment with Christine Dunn for coping and social skills.  She is no longer taking Vistaril and Lyrica.  I recommended to call us back if she has any question, concern if she feels worsening of the symptoms.  Discussed medication side effects and benefits.  I will see her again in 3 months. Time spent 25 minutes.  More than 50% of the time spent in psychoeducation, counseling  and coordination of care.  Discuss safety plan that anytime having active suicidal thoughts or homicidal thoughts then patient need to call 911 or go to the local emergency room.    Dessire Grimes T., MD 09/22/2014

## 2014-12-22 ENCOUNTER — Ambulatory Visit (INDEPENDENT_AMBULATORY_CARE_PROVIDER_SITE_OTHER): Payer: Federal, State, Local not specified - Other | Admitting: Psychiatry

## 2014-12-22 ENCOUNTER — Encounter (HOSPITAL_COMMUNITY): Payer: Self-pay | Admitting: Psychiatry

## 2014-12-22 DIAGNOSIS — F331 Major depressive disorder, recurrent, moderate: Secondary | ICD-10-CM | POA: Diagnosis not present

## 2014-12-22 MED ORDER — CLONAZEPAM 0.5 MG PO TABS: 0.5000 mg | ORAL_TABLET | Freq: Two times a day (BID) | ORAL | Status: DC

## 2014-12-22 MED ORDER — SERTRALINE HCL 100 MG PO TABS: ORAL_TABLET | ORAL | Status: DC

## 2014-12-22 NOTE — Progress Notes (Signed)
[facility] [id number] Progress Note  Christine Dunn [id number] 24 y.o.  12/22/2014 3:12 PM  Chief Complaint:  Medication management and follow-up.      History of Present Illness:  Christine Dunn came for her appointment.  She is excited because she is graduating next month.  She is hoping to work as a Engineer, site and looking for a job.  For past few weeks she was anxious because she had abnormal Pap smear and she had loop procedure.  She was told that she has dysplasia and she will require frequent monitoring.  Patient has family history of cervical cancer and she was very worried about it.  Overall her anxiety and depression is stable.  She denies any major panic attack.  She sleeping good.  Her diabetes is better control with the new medication.  She still have numbness and chronic pain but it is less intense and she does not take Lyrica every day.  She is seeing [name] for counseling.  Patient denies any agitation, crying spells, feeling of hopelessness or worthlessness.  She is not involved in any self abusive behavior in recent months.  She is getting amitriptyline from primary care physician for headaches.  Patient denies drinking or using any illegal substances.  Her appetite is okay.  Her vitals are stable.  Suicidal Ideation: No Plan Formed: No Patient has means to carry out plan: No  Homicidal Ideation: No Plan Formed: No Patient has means to carry out plan: No  Medical History; Patient has diabetes mellitus, neuropathy, hyperlipidemia.  Her primary care physician is [facility].   Past Psychiatric History/Hospitalization(s) Patient denies any history of suicidal attempt, inpatient psychiatric treatment, mania, psychosis or any hallucination.  She was seen by [name] for depression a few years ago and prescribed Zoloft until 2 years ago it stopped working.  She endorses history of scratching herself causing injury to her skin. She denies any history of mood swing,  aggression, violence or any impulsive behavior. In the past she has taken trazodone but did not work. Anxiety: No Bipolar Disorder: No Depression: No Mania: No Psychosis: No Schizophrenia: No Personality Disorder: No Hospitalization for psychiatric illness: No History of Electroconvulsive Shock Therapy: No Prior Suicide Attempts: No   ROS Psychiatric: Agitation: No Hallucination: No Depressed Mood: No Insomnia: No Hypersomnia: No Altered Concentration: No Feels Worthless: No Grandiose Ideas: No Belief In Special Powers: No New/Increased Substance Abuse: No Compulsions: No  Neurologic: Headache: No Seizure: No Paresthesias: Yes    Outpatient Encounter Prescriptions as of 12/22/2014  Medication Sig  . amitriptyline (ELAVIL) 10 MG tablet Take 1-2 tabs po qhs  . canagliflozin (INVOKANA) 100 MG TABS tablet   . clonazePAM (KLONOPIN) 0.5 MG tablet Take 1 tablet (0.5 mg total) by mouth 2 (two) times daily.  Marland Kitchen esomeprazole (NEXIUM) 40 MG capsule Take 40 mg by mouth daily.   . hydrocortisone cream 1 % Apply to affected area 2 times daily  . insulin aspart (NOVOLOG) 100 UNIT/ML injection Inject 5 Units into the skin 3 (three) times daily before meals. Novolog TID. The goal of CBG is 150. Patient is to take additional 1 unit with every 50 of cbgs above 150. She is to take additional 1 unit for every 3 CHO per patient report( sees endocrinologist Dr.Smith).  . insulin glargine (LANTUS) 100 UNIT/ML injection Inject 50 Units into the skin at bedtime.   Marland Kitchen ketoconazole (NIZORAL) 2 % shampoo Apply 1 application topically daily.  . medroxyPROGESTERone (DEPO-PROVERA) 150 MG/ML  injection Inject 150 mg into the muscle every 3 (three) months.  . metoprolol succinate (TOPROL XL) 25 MG 24 hr tablet Take 25 mg by mouth.  . neomycin-bacitracin-polymyxin (NEOSPORIN) ointment Apply 1 application topically at bedtime. Apply to affected areas on arms  . sertraline (ZOLOFT) 100 MG tablet Take 2 tab  daily (total dose 200 mg)  . [DISCONTINUED] clonazePAM (KLONOPIN) 0.5 MG tablet Take 1 tablet (0.5 mg total) by mouth 2 (two) times daily.  . [DISCONTINUED] sertraline (ZOLOFT) 100 MG tablet Take 2 tab daily (total dose 200 mg)    No results found for this or any previous visit (from the past 2160 hour(s)).    Physical Exam: Constitutional:  There were no vitals taken for this visit.  Musculoskeletal: Strength & Muscle Tone: within normal limits Gait & Station: normal Patient leans: N/A  Mental Status Examination;  Patient is well groomed and well dressed. she is pleasant and cooperative.  She maintained fair eye contact. She describes her mood euthymic and her affect is appropriate.  She denies any active or passive suicidal partial homicidal thought. Her fund of knowledge is adequate. There were no tremors or shakes. She denies any hallucination, paranoia or any delusions. There were no flight of ideas or any loose association. Her thought processes is logical and goal directed. Her speech is clear and coherent. She is alert and oriented x3. Her insight judgment and impulse control is okay.    Established Problem, Stable/Improving (1), Review of Psycho-Social Stressors (1), Review of Last Therapy Session (1) and Review of Medication Regimen & Side Effects (2)  Assessment: Axis I: Maj. depressive disorder, recurrent  Axis II: Deferred  Axis III:  Past Medical History  Diagnosis Date  . Foot deformity, acquired     little toes, toes nail are falling off, needs surgery, but cant happen till AIC < 9  . Fatty liver   . Diabetic neuropathy 01/08/2012  . Depression 12/25/2011  . Diabetes type 1, uncontrolled 08/27/1997  . Hyperlipidemia LDL goal < 100 12/19/2011  . Superficial skin infection 12/18/2011    Recurrent for past 2 years   . Insomnia 01/30/2012  . Chronic pain of right knee 02/2013    followed by Orthopedist Dr. Eulah PontMurphy    Plan:   patient is a stable on her current  medication.  Continue Klonopin 0.5 mg twice a day and Zoloft 200 mg daily.  She is getting amitriptyline from her primary care physician for headaches.  Recommended to call us back if she has any question or any concern.  Patient is seeing Carollee HerterShannon for counseling.  Follow-up in 3 months. al emergency room.    Travonte Byard T., MD 12/22/2014

## 2015-01-05 ENCOUNTER — Other Ambulatory Visit (HOSPITAL_COMMUNITY): Payer: Self-pay | Admitting: Psychiatry

## 2015-01-05 NOTE — Telephone Encounter (Signed)
Received refill request for patients Zoloft.  RX was sent 12-22-14 with 2 refills.  [facility]ed [facility] and spoke [facility] [place].  [facility] patient does have enough refills on file.  Per [facility] when patient calls in RX automatically it will send request to us.  Per [facility] ignore request.

## 2015-03-23 ENCOUNTER — Ambulatory Visit (HOSPITAL_COMMUNITY): Payer: Self-pay | Admitting: Psychiatry

## 2015-03-30 ENCOUNTER — Other Ambulatory Visit (HOSPITAL_COMMUNITY): Payer: Self-pay | Admitting: Psychiatry

## 2015-03-30 NOTE — Telephone Encounter (Signed)
PT will need to contact office for an appt. PT no show appt on 7/27.

## 2015-04-27 ENCOUNTER — Other Ambulatory Visit (HOSPITAL_COMMUNITY): Payer: Self-pay | Admitting: Psychiatry

## 2015-04-29 ENCOUNTER — Other Ambulatory Visit (HOSPITAL_COMMUNITY): Payer: Self-pay | Admitting: Psychiatry

## 2015-04-29 DIAGNOSIS — F331 Major depressive disorder, recurrent, moderate: Secondary | ICD-10-CM

## 2015-04-29 MED ORDER — SERTRALINE HCL 100 MG PO TABS: ORAL_TABLET | ORAL | Status: DC

## 2015-05-05 ENCOUNTER — Other Ambulatory Visit (HOSPITAL_COMMUNITY): Payer: Self-pay | Admitting: Psychiatry

## 2015-05-05 DIAGNOSIS — F331 Major depressive disorder, recurrent, moderate: Secondary | ICD-10-CM

## 2015-05-10 ENCOUNTER — Encounter (HOSPITAL_COMMUNITY): Payer: Self-pay | Admitting: Psychiatry

## 2015-05-10 ENCOUNTER — Ambulatory Visit (INDEPENDENT_AMBULATORY_CARE_PROVIDER_SITE_OTHER): Payer: Medicaid Other | Admitting: Psychiatry

## 2015-05-10 VITALS — BP 107/74 | HR 82 | Ht 63.0 in | Wt 126.6 lb

## 2015-05-10 DIAGNOSIS — F331 Major depressive disorder, recurrent, moderate: Secondary | ICD-10-CM

## 2015-05-10 DIAGNOSIS — F339 Major depressive disorder, recurrent, unspecified: Secondary | ICD-10-CM | POA: Diagnosis not present

## 2015-05-10 MED ORDER — CLONAZEPAM 0.5 MG PO TABS: 0.5000 mg | ORAL_TABLET | Freq: Every day | ORAL | Status: DC

## 2015-05-10 MED ORDER — SERTRALINE HCL 100 MG PO TABS: ORAL_TABLET | ORAL | Status: DC

## 2015-05-10 NOTE — Progress Notes (Signed)
[facility] [id number] Progress Note  Christine Dunn [id number] 24 y.o.  05/10/2015 4:39 PM  Chief Complaint:  Medication management and follow-up.      History of Present Illness:  Christine Dunn came for her appointment.  She is taking her medication as prescribed.  She is only taking Klonopin once a day because her anxiety is less intense .  She is hoping to find a job.  She felt since she graduated she is more relaxed and calm.  She sleeping better.  She denies any irritability, anger, mood swing.  She denies any major panic attack.  She is still some time uncomfortable around public places but overall she described things are going very well.  She saw her physician for abnormal Pap smear and she was told that no immediate action require and just continue to watch.  Her headaches are also under control.  She is taking amitriptyline every day and sometimes she requires Imitrex.  Her appetite is okay.  Her vitals are stable.  She denies any crying spells, feeling of hopelessness or worthlessness.  She denies drinking or using any illegal substances.  She lives by herself however her grandmother is living next to her and very involved in her daily life.  Suicidal Ideation: No Plan Formed: No Patient has means to carry out plan: No  Homicidal Ideation: No Plan Formed: No Patient has means to carry out plan: No  Medical History; Patient has diabetes mellitus, neuropathy, hyperlipidemia.  Her primary care physician is [facility].   Past Psychiatric History/Hospitalization(s) Patient denies any history of suicidal attempt, inpatient psychiatric treatment, mania, psychosis or any hallucination.  She was seen by [name] for depression a few years ago and prescribed Zoloft until 2 years ago it stopped working.  She endorses history of scratching herself causing injury to her skin. She denies any history of mood swing, aggression, violence or any impulsive behavior. In the past she has  taken trazodone but did not work. Anxiety: No Bipolar Disorder: No Depression: No Mania: No Psychosis: No Schizophrenia: No Personality Disorder: No Hospitalization for psychiatric illness: No History of Electroconvulsive Shock Therapy: No Prior Suicide Attempts: No   ROS Psychiatric: Agitation: No Hallucination: No Depressed Mood: No Insomnia: No Hypersomnia: No Altered Concentration: No Feels Worthless: No Grandiose Ideas: No Belief In Special Powers: No New/Increased Substance Abuse: No Compulsions: No  Neurologic: Headache: No Seizure: No Paresthesias: Yes    Outpatient Encounter Prescriptions as of 05/10/2015  Medication Sig  . amitriptyline (ELAVIL) 10 MG tablet Take 1-2 tabs po qhs  . canagliflozin (INVOKANA) 100 MG TABS tablet   . clonazePAM (KLONOPIN) 0.5 MG tablet Take 1 tablet (0.5 mg total) by mouth daily.  Marland Kitchen esomeprazole (NEXIUM) 40 MG capsule Take 40 mg by mouth daily.   . insulin aspart (NOVOLOG) 100 UNIT/ML injection Inject 5 Units into the skin 3 (three) times daily before meals. Novolog TID. The goal of CBG is 150. Patient is to take additional 1 unit with every 50 of cbgs above 150. She is to take additional 1 unit for every 3 CHO per patient report( sees endocrinologist Dr.Smith).  . insulin glargine (LANTUS) 100 UNIT/ML injection Inject 50 Units into the skin at bedtime.   Marland Kitchen ketoconazole (NIZORAL) 2 % shampoo Apply 1 application topically daily.  . medroxyPROGESTERone (DEPO-PROVERA) 150 MG/ML injection Inject 150 mg into the muscle every 3 (three) months.  . metoprolol succinate (TOPROL XL) 25 MG 24 hr tablet Take 25 mg by mouth.  Marland Kitchen  neomycin-bacitracin-polymyxin (NEOSPORIN) ointment Apply 1 application topically at bedtime. Apply to affected areas on arms  . sertraline (ZOLOFT) 100 MG tablet Take 2 tab daily (total dose 200 mg)  . [DISCONTINUED] clonazePAM (KLONOPIN) 0.5 MG tablet Take 1 tablet (0.5 mg total) by mouth 2 (two) times daily.  .  [DISCONTINUED] sertraline (ZOLOFT) 100 MG tablet Take 2 tab daily (total dose 200 mg)   No facility-administered encounter medications on file as of 05/10/2015.    No results found for this or any previous visit (from the past 2160 hour(s)).    Physical Exam: Constitutional:  BP 107/74 mmHg  Pulse 82  Ht 5\' 3"  (1.6 m)  Wt 126 lb 9.6 oz (57.425 kg)  BMI 22.43 kg/m2  Musculoskeletal: Strength & Muscle Tone: within normal limits Gait & Station: normal Patient leans: N/A  Mental Status Examination;  Patient is well groomed and well dressed. She is pleasant and cooperative.  She maintained fair eye contact. She describes her mood euthymic and her affect is appropriate.  She denies any active or passive suicidal partial homicidal thought. Her fund of knowledge is adequate. There were no tremors or shakes. She denies any hallucination, paranoia or any delusions. There were no flight of ideas or any loose association. Her thought processes is logical and goal directed. Her speech is clear and coherent. She is alert and oriented x3. Her insight judgment and impulse control is okay.    Established Problem, Stable/Improving (1), Review of Psycho-Social Stressors (1), Review of Last Therapy Session (1), Review of Medication Regimen & Side Effects (2) and Review of New Medication or Change in Dosage (2)  Assessment: Axis I: Maj. depressive disorder, recurrent  Axis II: Deferred  Axis III:  Past Medical History  Diagnosis Date  . Foot deformity, acquired     little toes, toes nail are falling off, needs surgery, but cant happen till AIC < 9  . Fatty liver   . Diabetic neuropathy 01/08/2012  . Depression 12/25/2011  . Diabetes type 1, uncontrolled 08/27/1997  . Hyperlipidemia LDL goal < 100 12/19/2011  . Superficial skin infection 12/18/2011    Recurrent for past 2 years   . Insomnia 01/30/2012  . Chronic pain of right knee 02/2013    followed by Orthopedist Dr. Eulah Pont    Plan:   patient is  a stable on her current medication.  I will reduce Klonopin 0.5 mg once a day since she is not taking twice a day.  Continue Zoloft 200 mg daily.  She has no side effects including any tremors, shakes or any EPS.  She is getting amitriptyline from her primary care physician for headaches.  Recommended to call us back if she has any question or any concern.  Patient is seeing [name] for counseling.  Follow-up in 3 months.  ARFEEN,SYED T., MD 05/10/2015

## 2015-08-09 ENCOUNTER — Ambulatory Visit (HOSPITAL_COMMUNITY): Payer: Self-pay | Admitting: Psychiatry

## 2015-09-04 ENCOUNTER — Other Ambulatory Visit (HOSPITAL_COMMUNITY): Payer: Self-pay | Admitting: Psychiatry

## 2015-09-12 ENCOUNTER — Ambulatory Visit (INDEPENDENT_AMBULATORY_CARE_PROVIDER_SITE_OTHER): Payer: Medicaid Other | Admitting: Psychiatry

## 2015-09-12 ENCOUNTER — Encounter (HOSPITAL_COMMUNITY): Payer: Self-pay | Admitting: Psychiatry

## 2015-09-12 VITALS — BP 102/70 | HR 91 | Ht 64.0 in | Wt 124.4 lb

## 2015-09-12 DIAGNOSIS — F331 Major depressive disorder, recurrent, moderate: Secondary | ICD-10-CM

## 2015-09-12 MED ORDER — SERTRALINE HCL 100 MG PO TABS: ORAL_TABLET | ORAL | Status: DC

## 2015-09-12 MED ORDER — CLONAZEPAM 0.5 MG PO TABS: 0.5000 mg | ORAL_TABLET | Freq: Every day | ORAL | Status: DC | PRN

## 2015-09-12 NOTE — Progress Notes (Signed)
[facility] [id number] Progress Note  Christine Dunn [id number] 24 y.o.  09/12/2015 11:47 AM  Chief Complaint:  Medication management and follow-up.      History of Present Illness:  Christine Dunn came for her appointment.  She had a busy Christmas.  She visited her father, mother, grandmother and other relatives.  She admitted more overwhelmed around Christmas time but otherwise she is doing fine.  She is still not able to find a job.  She has so far 2 interviews .  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  She taking Klonopin only as needed.  She is seeing Christine Dunn for counseling.  She sleeping good.  She denies any irritability, anger, mood swing or any major panic attack.  Overall her anxiety is under control with Zoloft.  She has no tremors, shakes or any EPS.  Her speech is good.  She is watching her diet and lately her blood sugar is normal.  However she is scheduled to see her primary care physician coming Thursday she wants to continue Zoloft since it is helping her depression and anxiety.  She is also taking amitriptyline prescribed from her primary care physician for her headaches.  Patient denies drinking or using any illegal substances.  She lives by herself however her grandmother, father and brother live close by.  Suicidal Ideation: No Plan Formed: No Patient has means to carry out plan: No  Homicidal Ideation: No Plan Formed: No Patient has means to carry out plan: No  Medical History; Patient has diabetes mellitus, headache , neuropathy, hyperlipidemia.  [facility].   Past Psychiatric History/Hospitalization(s) Patient denies any history of suicidal attempt, inpatient psychiatric treatment, mania, psychosis or any hallucination.  She was seen by Dr. Lucianne MussKumar for depression a few years ago and prescribed Zoloft until 2 years ago it stopped working.  She endorses history of scratching herself  causing injury to her skin. She denies any history of mood swing, aggression, violence or any impulsive behavior. In the past she has taken trazodone but did not work. Anxiety: No Bipolar Disorder: No Depression: No Mania: No Psychosis: No Schizophrenia: No Personality Disorder: No Hospitalization for psychiatric illness: No History of Electroconvulsive Shock Therapy: No Prior Suicide Attempts: No   Review of Systems  Constitutional: Negative.   Skin: Negative for itching and rash.  Neurological: Negative for dizziness and tremors.  Psychiatric/Behavioral: Negative for suicidal ideas, hallucinations and substance abuse. The patient does not have insomnia.    Psychiatric: Agitation: No Hallucination: No Depressed Mood: No Insomnia: No Hypersomnia: No Altered Concentration: No Feels Worthless: No Grandiose Ideas: No Belief In Special Powers: No New/Increased Substance Abuse: No Compulsions: No  Neurologic: Headache: No Seizure: No Paresthesias: Yes    Outpatient Encounter Prescriptions as of 09/12/2015  Medication Sig  . amitriptyline (ELAVIL) 10 MG tablet Take 1-2 tabs po qhs  . canagliflozin (INVOKANA) 100 MG TABS tablet   . clonazePAM (KLONOPIN) 0.5 MG tablet Take 1 tablet (0.5 mg total) by mouth daily as needed for anxiety.  Marland Kitchen. esomeprazole (NEXIUM) 40 MG capsule Take 40 mg by mouth daily.   . insulin aspart (NOVOLOG) 100 UNIT/ML injection Inject 5 Units into the skin 3 (three) times daily before meals. Novolog TID. The goal of CBG is 150. Patient is to take additional 1 unit with every 50 of cbgs above 150. She is to take additional 1 unit for every 3 CHO  per patient report( sees endocrinologist Dr.Smith).  . insulin glargine (LANTUS) 100 UNIT/ML injection Inject 50 Units into the skin at bedtime.   Marland Kitchen ketoconazole (NIZORAL) 2 % shampoo Apply 1 application topically daily.  . medroxyPROGESTERone (DEPO-PROVERA) 150 MG/ML injection Inject 150 mg into the muscle every 3  (three) months.  . metoprolol succinate (TOPROL XL) 25 MG 24 hr tablet Take 25 mg by mouth.  . neomycin-bacitracin-polymyxin (NEOSPORIN) ointment Apply 1 application topically at bedtime. Apply to affected areas on arms  . sertraline (ZOLOFT) 100 MG tablet Take 2 tab daily (total dose 200 mg)  . [DISCONTINUED] clonazePAM (KLONOPIN) 0.5 MG tablet Take 1 tablet (0.5 mg total) by mouth daily.  . [DISCONTINUED] sertraline (ZOLOFT) 100 MG tablet Take 2 tab daily (total dose 200 mg)   No facility-administered encounter medications on file as of 09/12/2015.    No results found for this or any previous visit (from the past 2160 hour(s)).    Physical Exam: Constitutional:  BP 102/70 mmHg  Pulse 91  Ht 5\' 4"  (1.626 m)  Wt 124 lb 6.4 oz (56.427 kg)  BMI 21.34 kg/m2  Musculoskeletal: Strength & Muscle Tone: within normal limits Gait & Station: normal Patient leans: N/A  Mental Status Examination;  Patient is well groomed and well dressed. She is pleasant and cooperative.  She maintained fair eye contact. She describes her mood euthymic and her affect is appropriate.  She denies any active or passive suicidal partial homicidal thought. Her fund of knowledge is adequate. There were no tremors or shakes. She denies any hallucination, paranoia or any delusions. There were no flight of ideas or any loose association. Her thought processes is logical and goal directed. Her speech is clear and coherent. She is alert and oriented x3. Her insight judgment and impulse control is okay.    Established Problem, Stable/Improving (1), Review of Psycho-Social Stressors (1), Review of Last Therapy Session (1) and Review of Medication Regimen & Side Effects (2)  Assessment: Axis I: Maj. depressive disorder, recurrent  Axis II: Deferred  Axis III:  Past Medical History  Diagnosis Date  . Foot deformity, acquired     little toes, toes nail are falling off, needs surgery, but cant happen till AIC < 9  .  Fatty liver   . Diabetic neuropathy (HCC) 01/08/2012  . Depression 12/25/2011  . Diabetes type 1, uncontrolled (HCC) 08/27/1997  . Hyperlipidemia LDL goal < 100 12/19/2011  . Superficial skin infection 12/18/2011    Recurrent for past 2 years   . Insomnia 01/30/2012  . Chronic pain of right knee 02/2013    followed by Orthopedist Dr. Eulah Pont    Plan:  Patient is a stable on her current medication.  She is taking Klonopin 0.5 mg only as needed for severe panic attack.  Discussed medication side effects and benefits.  I will continue Klonopin 0.5 mg as needed for anxiety and Zoloft 200 mg daily.  She has no side effects including any tremors, shakes or any EPS.  She is getting amitriptyline from her primary care physician for headaches.  Recommended to call us back if she has any question or any concern.  Patient is seeing Christine Herter for counseling.  Follow-up in 3 months.  Atlas [place] T., MD 09/12/2015

## 2015-09-18 ENCOUNTER — Encounter (HOSPITAL_COMMUNITY): Payer: Self-pay | Admitting: *Deleted

## 2015-09-18 ENCOUNTER — Emergency Department (HOSPITAL_COMMUNITY): Payer: Medicaid Other

## 2015-09-18 ENCOUNTER — Emergency Department (HOSPITAL_COMMUNITY)
Admission: EM | Admit: 2015-09-18 | Discharge: 2015-09-18 | Disposition: A | Discharge status: Home / Self Care | Payer: Medicaid Other | Attending: Emergency Medicine | Admitting: Emergency Medicine

## 2015-09-18 DIAGNOSIS — Y9389 Activity, other specified: Secondary | ICD-10-CM | POA: Diagnosis not present

## 2015-09-18 DIAGNOSIS — W458XXA Other foreign body or object entering through skin, initial encounter: Secondary | ICD-10-CM | POA: Insufficient documentation

## 2015-09-18 DIAGNOSIS — G8929 Other chronic pain: Secondary | ICD-10-CM | POA: Diagnosis not present

## 2015-09-18 DIAGNOSIS — Z23 Encounter for immunization: Secondary | ICD-10-CM | POA: Insufficient documentation

## 2015-09-18 DIAGNOSIS — Z872 Personal history of diseases of the skin and subcutaneous tissue: Secondary | ICD-10-CM | POA: Insufficient documentation

## 2015-09-18 DIAGNOSIS — Z880 Allergy status to penicillin: Secondary | ICD-10-CM | POA: Insufficient documentation

## 2015-09-18 DIAGNOSIS — Y998 Other external cause status: Secondary | ICD-10-CM | POA: Diagnosis not present

## 2015-09-18 DIAGNOSIS — S90852A Superficial foreign body, left foot, initial encounter: Secondary | ICD-10-CM

## 2015-09-18 DIAGNOSIS — E1040 Type 1 diabetes mellitus with diabetic neuropathy, unspecified: Secondary | ICD-10-CM | POA: Diagnosis not present

## 2015-09-18 DIAGNOSIS — S91342A Puncture wound with foreign body, left foot, initial encounter: Secondary | ICD-10-CM | POA: Diagnosis not present

## 2015-09-18 DIAGNOSIS — Z8719 Personal history of other diseases of the digestive system: Secondary | ICD-10-CM | POA: Insufficient documentation

## 2015-09-18 DIAGNOSIS — Z8739 Personal history of other diseases of the musculoskeletal system and connective tissue: Secondary | ICD-10-CM | POA: Insufficient documentation

## 2015-09-18 DIAGNOSIS — Z794 Long term (current) use of insulin: Secondary | ICD-10-CM | POA: Insufficient documentation

## 2015-09-18 DIAGNOSIS — Y9289 Other specified places as the place of occurrence of the external cause: Secondary | ICD-10-CM | POA: Insufficient documentation

## 2015-09-18 DIAGNOSIS — S91332A Puncture wound without foreign body, left foot, initial encounter: Secondary | ICD-10-CM

## 2015-09-18 DIAGNOSIS — F329 Major depressive disorder, single episode, unspecified: Secondary | ICD-10-CM | POA: Diagnosis not present

## 2015-09-18 DIAGNOSIS — G4700 Insomnia, unspecified: Secondary | ICD-10-CM | POA: Insufficient documentation

## 2015-09-18 DIAGNOSIS — Z79899 Other long term (current) drug therapy: Secondary | ICD-10-CM | POA: Diagnosis not present

## 2015-09-18 DIAGNOSIS — S99922A Unspecified injury of left foot, initial encounter: Secondary | ICD-10-CM | POA: Diagnosis present

## 2015-09-18 MED ORDER — TETANUS-DIPHTH-ACELL PERTUSSIS 5-2.5-18.5 LF-MCG/0.5 IM SUSP
0.5000 mL | Freq: Once | INTRAMUSCULAR | Status: AC
  Administered 2015-09-18: 0.5 mL via INTRAMUSCULAR
  Filled 2015-09-18: qty 0.5

## 2015-09-18 MED ORDER — HYDROCODONE-ACETAMINOPHEN 5-325 MG PO TABS
1.0000 | ORAL_TABLET | Freq: Once | ORAL | Status: AC
  Administered 2015-09-18: 1 via ORAL
  Filled 2015-09-18: qty 1

## 2015-09-18 MED ORDER — SULFAMETHOXAZOLE-TRIMETHOPRIM 800-160 MG PO TABS: 1.0000 | ORAL_TABLET | Freq: Two times a day (BID) | ORAL | Status: AC

## 2015-09-18 MED ORDER — LIDOCAINE-EPINEPHRINE 2 %-1:100000 IJ SOLN
20.0000 mL | Freq: Once | INTRAMUSCULAR | Status: DC
  Filled 2015-09-18: qty 20

## 2015-09-18 MED ORDER — HYDROCODONE-ACETAMINOPHEN 5-325 MG PO TABS
2.0000 | ORAL_TABLET | ORAL | Status: DC | PRN
Start: 2015-09-18 — End: 2015-12-12

## 2015-09-18 MED ORDER — CIPROFLOXACIN HCL 500 MG PO TABS: 500.0000 mg | ORAL_TABLET | Freq: Two times a day (BID) | ORAL | Status: DC

## 2015-09-18 NOTE — ED Notes (Signed)
Declined W/C at D/C and was escorted to lobby by RN. 

## 2015-09-18 NOTE — Discharge Instructions (Signed)
Take medications as prescribed. Keep wound clean using antibacterial soap and water and pat dry. You may also take ibuprofen 800 mg 3 times daily as needed for pain relief. Follow-up with your primary care provider or return to the emergency department in 2 days for wound recheck. Return to the emergency department if symptoms worsen or new onset of fever, redness, warmth, swelling, drainage, numbness, tingling, decreased range of motion.

## 2015-09-18 NOTE — ED Notes (Signed)
Pt reports she stepped on a fishing hook today. Hook is located in planter side of foot.

## 2015-09-18 NOTE — ED Provider Notes (Signed)
CSN: [id number]     Arrival date & time 09/18/15  1132 History  By signing my name below, I, Linus Galas, attest that this documentation has been prepared under the direction and in the presence of Melburn Hake, [place]. Electronically Signed: Linus Galas, ED Scribe. 09/18/2015. 12:59 PM.    Chief Complaint  Patient presents with  . Foot Injury   The history is provided by the patient. No language interpreter was used.   HPI Comments: Christine Dunn is a 24 y.o. female who presents to the Emergency Department complaining of constant, unchanged, left, plantar foot pain s/p stepping on a fishing hook PTA. Pt reports that she tried to remove the hook gently but was unable to.  Pt denies any numbness/tingling, bleeding from the affected area, or swelling. Pt last Tetanus is unknown.    Past Medical History  Diagnosis Date  . Foot deformity, acquired     little toes, toes nail are falling off, needs surgery, but cant happen till AIC < 9  . Fatty liver   . Diabetic neuropathy (HCC) 01/08/2012  . Depression 12/25/2011  . Diabetes type 1, uncontrolled (HCC) 08/27/1997  . Hyperlipidemia LDL goal < 100 12/19/2011  . Superficial skin infection 12/18/2011    Recurrent for past 2 years   . Insomnia 01/30/2012  . Chronic pain of right knee 02/2013    followed by Orthopedist Dr. Eulah Pont   Past Surgical History  Procedure Laterality Date  . Wrist surgery      rerouted tendons in wrist   Family History  Problem Relation Age of Onset  . Diabetes Maternal Grandfather   . Depression Mother    Social History  Substance Use Topics  . Smoking status: Never Smoker   . Smokeless tobacco: None  . Alcohol Use: No   OB History    No data available     Review of Systems  Constitutional: Negative for fever.  Skin: Positive for wound.  Neurological: Negative for weakness and numbness.   Allergies  Gabapentin (once-daily); Penicillins; and Statins  Home Medications   Prior to Admission  medications   Medication Sig Start Date End Date Taking? Authorizing Provider  amitriptyline (ELAVIL) 10 MG tablet Take 1-2 tabs po qhs 07/02/14   Historical Provider, MD  canagliflozin (INVOKANA) 100 MG TABS tablet  06/25/14   Historical Provider, MD  ciprofloxacin (CIPRO) 500 MG tablet Take 1 tablet (500 mg total) by mouth 2 (two) times daily. 09/18/15   Barrett Henle, PA-C  clonazePAM (KLONOPIN) 0.5 MG tablet Take 1 tablet (0.5 mg total) by mouth daily as needed for anxiety. 09/12/15 09/11/16  Cleotis Nipper, MD  esomeprazole (NEXIUM) 40 MG capsule Take 40 mg by mouth daily.     Historical Provider, MD  HYDROcodone-acetaminophen (NORCO/VICODIN) 5-325 MG tablet Take 2 tablets by mouth every 4 (four) hours as needed. 09/18/15   Barrett Henle, PA-C  insulin aspart (NOVOLOG) 100 UNIT/ML injection Inject 5 Units into the skin 3 (three) times daily before meals. Novolog TID. The goal of CBG is 150. Patient is to take additional 1 unit with every 50 of cbgs above 150. She is to take additional 1 unit for every 3 CHO per patient report( sees endocrinologist Dr.Smith). 10/30/12   Dede Query, MD  insulin glargine (LANTUS) 100 UNIT/ML injection Inject 50 Units into the skin at bedtime.  01/08/12   Melida Quitter, MD  ketoconazole (NIZORAL) 2 % shampoo Apply 1 application topically daily. 03/05/14   Adrian Blackwater  Moding, MD  medroxyPROGESTERone (DEPO-PROVERA) 150 MG/ML injection Inject 150 mg into the muscle every 3 (three) months.    Historical Provider, MD  metoprolol succinate (TOPROL XL) 25 MG 24 hr tablet Take 25 mg by mouth. 07/02/14 07/02/15  Historical Provider, MD  neomycin-bacitracin-polymyxin (NEOSPORIN) ointment Apply 1 application topically at bedtime. Apply to affected areas on arms    Historical Provider, MD  sertraline (ZOLOFT) 100 MG tablet Take 2 tab daily (total dose 200 mg) 09/12/15   Cleotis Nipper, MD  sulfamethoxazole-trimethoprim (BACTRIM DS,SEPTRA DS) 800-160 MG tablet Take 1 tablet by  mouth 2 (two) times daily. 09/18/15 09/25/15  Satira Sark Charlton Boule, PA-C   BP 105/69 mmHg  Pulse 94  Temp(Src) 98.6 F (37 C) (Oral)  Resp 16  Ht  (1.6 m)  Wt 55.792 kg  BMI 21.79 kg/m2  SpO2 99% Physical Exam  Constitutional: She is oriented to person, place, and time. She appears well-developed and well-nourished.  HENT:  Head: Normocephalic and atraumatic.  Eyes: Conjunctivae and EOM are normal. Right eye exhibits no discharge. Left eye exhibits no discharge. No scleral icterus.  Neck: Normal range of motion. Neck supple.  Cardiovascular: Normal rate.   Pulmonary/Chest: Effort normal.  Abdominal: She exhibits no distension.  Musculoskeletal:  1 prong of 3 pronged metal fish hook penetrating plantar aspect of left foot proximal to third MTP joint at foot pad. No active bleeding, surrounding swelling, erythema or drainage. Mild tenderness around penetrating wound. Full ROM of left toes, foot, and ankle. 2+ PT, DP pulse. Sensation intact. Cap refill less than 2 sec.  Neurological: She is alert and oriented to person, place, and time.  Skin: Skin is warm and dry.  Psychiatric: She has a normal mood and affect.  Nursing note and vitals reviewed.   ED Course  .Foreign Body Removal Date/Time: 09/19/2015 3:00 PM Performed by: Barrett Henle Authorized by: Barrett Henle Consent: Verbal consent obtained. Risks and benefits: risks, benefits and alternatives were discussed Consent given by: patient Patient understanding: patient states understanding of the procedure being performed Patient identity confirmed: verbally with patient Intake: left foot. Anesthesia: local infiltration Local anesthetic: lidocaine 2% with epinephrine Anesthetic total: 5 ml Patient cooperative: yes Complexity: simple 1 objects recovered. Objects recovered: fishing hook with barb Post-procedure assessment: foreign body removed Patient tolerance: Patient tolerated the procedure  well with no immediate complications    DIAGNOSTIC STUDIES: Oxygen Saturation is 100% on room aire, normal by my interpretation.    COORDINATION OF CARE: 12:55 PM Will order right foot x-ray. Discussed treatment plan with pt at bedside and pt agreed to plan.  Labs Review Labs Reviewed - No data to display  Imaging Review Dg Foot 2 Views Left  09/18/2015  CLINICAL DATA:  Fishing hook in plantar region, the patient stepped on it EXAM: LEFT FOOT - 2 VIEW COMPARISON:  09/13/2010 FINDINGS: Two views of the left foot submitted. Consistent with history there is a double head fishing hook within soft tissue plantar aspect forefoot with the tips with between proximal phalanx second and third toes. No evidence of acute fracture or subluxation. IMPRESSION: Consistent with history there is a double head fishing hook within soft tissue plantar aspect forefoot with the tips with between proximal phalanx second and third toes. No evidence of acute fracture or subluxation. Electronically Signed   By: Natasha Mead M.D.   On: 09/18/2015 14:18   I have personally reviewed and evaluated these images and lab results as part of  my medical decision-making.   Filed Vitals:   09/18/15 1222 09/18/15 1602  BP: 110/73 105/69  Pulse: 95 94  Temp: 98.2 F (36.8 C) 98.6 F (37 C)  Resp: 17 16     MDM   Final diagnoses:  Puncture wound of foot, left, initial encounter    Patient presents after stepping on a fishing hook with her left foot. No active bleeding. VSS. Exam revealed one prong of 3. prong fishing hook penetrating plantar aspect of left foot, no active bleeding, no surrounding erythema swelling or drainage. Left foot neurovascularly intact. Patient given T gap in the ED. Removal foreign body successful without any competitions. Plan to discharge patient home with pain meds and antibiotics. Advised patient to follow up with PCP/ED in 2 days for puncture wound recheck.  Evaluation does not show pathology  requring ongoing emergent intervention or admission. Pt is hemodynamically stable and mentating appropriately. Discussed findings/results and plan with patient/guardian, who agrees with plan. All questions answered. Return precautions discussed and outpatient follow up given.    I personally performed the services described in this documentation, which was scribed in my presence. The recorded information has been reviewed and is accurate.      Satira Sark [place], [place] 09/20/15 0144  Doug Sou, MD 09/20/15 1018

## 2015-10-31 DIAGNOSIS — Z794 Long term (current) use of insulin: Secondary | ICD-10-CM | POA: Insufficient documentation

## 2015-12-08 ENCOUNTER — Other Ambulatory Visit (HOSPITAL_COMMUNITY): Payer: Self-pay | Admitting: Psychiatry

## 2015-12-12 ENCOUNTER — Ambulatory Visit (INDEPENDENT_AMBULATORY_CARE_PROVIDER_SITE_OTHER): Payer: Medicaid Other | Admitting: Psychiatry

## 2015-12-12 ENCOUNTER — Encounter (HOSPITAL_COMMUNITY): Payer: Self-pay | Admitting: Psychiatry

## 2015-12-12 VITALS — BP 112/75 | HR 82 | Ht 64.0 in | Wt 122.0 lb

## 2015-12-12 DIAGNOSIS — F331 Major depressive disorder, recurrent, moderate: Secondary | ICD-10-CM

## 2015-12-12 MED ORDER — SERTRALINE HCL 100 MG PO TABS: ORAL_TABLET | ORAL | Status: DC

## 2015-12-12 NOTE — Progress Notes (Signed)
[facility] [id number] Progress Note  Christine Dunn [id number] 25 y.o.  12/12/2015 11:31 AM  Chief Complaint:  Medication management and follow-up.      History of Present Illness:  Christine Dunn came for her appointment.  She is taking Zoloft 200 mg daily.  She denies any major panic attack.  She rarely takes Klonopin.  She has unable to find a job but she is actively looking for it.  She recently seen her primary care physician and her hemoglobin A1c was 11.5.  Her cholesterol was high and she is now taking Zetia for high cholesterol.  She's also taking amitriptyline for headaches.  Patient denies any irritability, anger, mood swing.  She denies any crying spells.  She is excited as she has wedding date on 11/10/2016.  Patient reported that her relationship is going very well.  She wants to continue Zoloft and Klonopin as needed.  She has no tremors, shakes, EPS.  Patient denies drinking alcohol or using any illegal substances.  Her energy level is good.  Her appetite is okay.  Her vitals are stable.  Patient lives by herself however her grandfather, father and brother live close by.  Suicidal Ideation: No Plan Formed: No Patient has means to carry out plan: No  Homicidal Ideation: No Plan Formed: No Patient has means to carry out plan: No  Medical History; Patient has diabetes mellitus, headache , neuropathy, hyperlipidemia.  [facility]   Past Psychiatric History/Hospitalization(s) Patient denies any history of suicidal attempt, inpatient psychiatric treatment, mania, psychosis or any hallucination.  She was seen by Dr. Lucianne MussKumar for depression a few years ago and prescribed Zoloft until 2 years ago it stopped working.  She endorses history of scratching herself causing injury to her skin. She denies any history of mood swing, aggression, violence or any impulsive behavior. In the past she has taken trazodone but did not work. Anxiety: No Bipolar  Disorder: No Depression: No Mania: No Psychosis: No Schizophrenia: No Personality Disorder: No Hospitalization for psychiatric illness: No History of Electroconvulsive Shock Therapy: No Prior Suicide Attempts: No   Review of Systems  Constitutional: Negative.   Skin: Negative for itching and rash.  Neurological: Negative for dizziness and tremors.  Psychiatric/Behavioral: Negative for suicidal ideas, hallucinations and substance abuse. The patient does not have insomnia.    Psychiatric: Agitation: No Hallucination: No Depressed Mood: No Insomnia: No Hypersomnia: No Altered Concentration: No Feels Worthless: No Grandiose Ideas: No Belief In Special Powers: No New/Increased Substance Abuse: No Compulsions: No  Neurologic: Headache: No Seizure: No Paresthesias: Yes    Outpatient Encounter Prescriptions as of 12/12/2015  Medication Sig  . amitriptyline (ELAVIL) 10 MG tablet Take 1-2 tabs po qhs  . canagliflozin (INVOKANA) 100 MG TABS tablet   . clonazePAM (KLONOPIN) 0.5 MG tablet Take 1 tablet (0.5 mg total) by mouth daily as needed for anxiety.  Marland Kitchen. esomeprazole (NEXIUM) 40 MG capsule Take 40 mg by mouth daily.   . insulin aspart (NOVOLOG) 100 UNIT/ML injection Inject 5 Units into the skin 3 (three) times daily before meals. Novolog TID. The goal of CBG is 150. Patient is to take additional 1 unit with every 50 of cbgs above 150. She is to take additional 1 unit for every 3 CHO per patient report( sees endocrinologist Dr.Smith).  . insulin glargine (LANTUS) 100 UNIT/ML injection Inject 50 Units into the skin at bedtime.   Marland Kitchen. ketoconazole (NIZORAL) 2 % shampoo Apply 1 application topically daily.  .Marland Kitchen  medroxyPROGESTERone (DEPO-PROVERA) 150 MG/ML injection Inject 150 mg into the muscle every 3 (three) months.  . metoprolol succinate (TOPROL XL) 25 MG 24 hr tablet Take 25 mg by mouth.  . metoprolol succinate (TOPROL-XL) 25 MG 24 hr tablet 25 mg.  . neomycin-bacitracin-polymyxin  (NEOSPORIN) ointment Apply 1 application topically at bedtime. Apply to affected areas on arms  . sertraline (ZOLOFT) 100 MG tablet Take 2 tab daily (total dose 200 mg)  . SUMAtriptan (IMITREX) 50 MG tablet TAKE 1 TABLET BY MOUTH AS NEEDED FOR MIGRAINE. MAY REPEAT DOSE IN 2 HOURS  . ZETIA 10 MG tablet TAKE 1 TABLET (10 MG TOTAL) BY MOUTH DAILY.  . [DISCONTINUED] ciprofloxacin (CIPRO) 500 MG tablet Take 1 tablet (500 mg total) by mouth 2 (two) times daily.  . [DISCONTINUED] HYDROcodone-acetaminophen (NORCO/VICODIN) 5-325 MG tablet Take 2 tablets by mouth every 4 (four) hours as needed.  . [DISCONTINUED] sertraline (ZOLOFT) 100 MG tablet Take 2 tab daily (total dose 200 mg)   No facility-administered encounter medications on file as of 12/12/2015.    No results found for this or any previous visit (from the past 2160 hour(s)).    Physical Exam: Constitutional:  BP 112/75 mmHg  Pulse 82  Ht  (1.626 m)  Wt 122 lb (55.339 kg)  BMI 20.93 kg/m2  Musculoskeletal: Strength & Muscle Tone: within normal limits Gait & Station: normal Patient leans: N/A  Mental Status Examination;  Patient is well groomed and well dressed. She is pleasant and cooperative.  She maintained fair eye contact. She describes her mood euthymic and her affect is appropriate.  She denies any active or passive suicidal partial homicidal thought. Her fund of knowledge is adequate. There were no tremors or shakes. She denies any hallucination, paranoia or any delusions. There were no flight of ideas or any loose association. Her thought processes is logical and goal directed. Her speech is clear and coherent. She is alert and oriented x3. Her insight judgment and impulse control is okay.   Established Problem, Stable/Improving (1), Review of Psycho-Social Stressors (1), Review of Last Therapy Session (1) and Review of Medication Regimen & Side Effects (2)  Assessment: Axis I: Maj. depressive disorder, recurrent  Axis  II: Deferred  Axis III:  Past Medical History  Diagnosis Date  . Foot deformity, acquired     little toes, toes nail are falling off, needs surgery, but cant happen till AIC < 9  . Fatty liver   . Diabetic neuropathy (HCC) 01/08/2012  . Depression 12/25/2011  . Diabetes type 1, uncontrolled (HCC) 08/27/1997  . Hyperlipidemia LDL goal < 100 12/19/2011  . Superficial skin infection 12/18/2011    Recurrent for past 2 years   . Insomnia 01/30/2012  . Chronic pain of right knee 02/2013    followed by Orthopedist Dr. Eulah Pont    Plan:  Patient is a stable on her current medication.  She is taking Klonopin 0.5 mg only as needed for severe panic attack.  I will continue Zoloft 200 mg daily.  She does not need a new prescriber and Klonopin at this time.  Discussed medication side effects and benefits. She has no side effects including any tremors, shakes or any EPS.  She is getting amitriptyline from her primary care physician for headaches.  Recommended to call us back if she has any question or any concern.  Patient is seeing [name] for counseling.  Follow-up in 3 months.  Dhanvin Szeto T., MD 12/12/2015

## 2016-03-14 ENCOUNTER — Ambulatory Visit (HOSPITAL_COMMUNITY): Payer: Self-pay | Admitting: Psychiatry

## 2016-03-15 ENCOUNTER — Other Ambulatory Visit (HOSPITAL_COMMUNITY): Payer: Self-pay | Admitting: Psychiatry

## 2016-03-20 DIAGNOSIS — R29898 Other symptoms and signs involving the musculoskeletal system: Secondary | ICD-10-CM | POA: Insufficient documentation

## 2016-03-20 DIAGNOSIS — M79672 Pain in left foot: Secondary | ICD-10-CM | POA: Insufficient documentation

## 2016-03-23 ENCOUNTER — Emergency Department (HOSPITAL_COMMUNITY)
Admission: EM | Admit: 2016-03-23 | Discharge: 2016-03-23 | Disposition: A | Discharge status: Home / Self Care | Payer: Medicaid Other | Attending: Emergency Medicine | Admitting: Emergency Medicine

## 2016-03-23 ENCOUNTER — Encounter (HOSPITAL_COMMUNITY): Payer: Self-pay | Admitting: Emergency Medicine

## 2016-03-23 DIAGNOSIS — Y9301 Activity, walking, marching and hiking: Secondary | ICD-10-CM | POA: Insufficient documentation

## 2016-03-23 DIAGNOSIS — Y999 Unspecified external cause status: Secondary | ICD-10-CM | POA: Diagnosis not present

## 2016-03-23 DIAGNOSIS — Y929 Unspecified place or not applicable: Secondary | ICD-10-CM | POA: Insufficient documentation

## 2016-03-23 DIAGNOSIS — E10319 Type 1 diabetes mellitus with unspecified diabetic retinopathy without macular edema: Secondary | ICD-10-CM | POA: Insufficient documentation

## 2016-03-23 DIAGNOSIS — I10 Essential (primary) hypertension: Secondary | ICD-10-CM | POA: Diagnosis not present

## 2016-03-23 DIAGNOSIS — S93602A Unspecified sprain of left foot, initial encounter: Secondary | ICD-10-CM | POA: Diagnosis not present

## 2016-03-23 DIAGNOSIS — M79672 Pain in left foot: Secondary | ICD-10-CM

## 2016-03-23 DIAGNOSIS — E1040 Type 1 diabetes mellitus with diabetic neuropathy, unspecified: Secondary | ICD-10-CM | POA: Diagnosis not present

## 2016-03-23 DIAGNOSIS — S99922A Unspecified injury of left foot, initial encounter: Secondary | ICD-10-CM | POA: Diagnosis present

## 2016-03-23 DIAGNOSIS — X509XXA Other and unspecified overexertion or strenuous movements or postures, initial encounter: Secondary | ICD-10-CM | POA: Diagnosis not present

## 2016-03-23 DIAGNOSIS — S9032XA Contusion of left foot, initial encounter: Secondary | ICD-10-CM

## 2016-03-23 MED ORDER — IBUPROFEN 800 MG PO TABS: 800.0000 mg | ORAL_TABLET | Freq: Three times a day (TID) | ORAL | 0 refills | Status: DC | PRN

## 2016-03-23 MED ORDER — TRAMADOL HCL 50 MG PO TABS: 50.0000 mg | ORAL_TABLET | Freq: Four times a day (QID) | ORAL | 0 refills | Status: DC | PRN

## 2016-03-23 NOTE — ED Provider Notes (Signed)
[facility]-EMERGENCY DEPT Provider Note  CSN: [id number] Arrival date & time: 03/23/16  1014  First Provider Contact: 11:10 AM  By signing my name below, I, Essence Howell, attest that this documentation has been prepared under the direction and in the presence of Charlestine Night, PA-C Electronically Signed: Charline Bills, ED Scribe 03/23/2016 at 11:41 AM.  History   Chief Complaint Chief Complaint  Patient presents with  . Foot Pain    HPI Larry L Horne is a 25 y.o. female, with a h/o type 1 diabetes, who presents to the Emergency Department complaining of gradually worsening left foot pain s/p a fall that occurred several days ago. Pt reports that she was walking down 3 steps outside when she missed the last step and twisted her left foot. She denies head injury or LOC. Pt reports increased foot pain with movement and bearing weight. She reports associated tingling in her left toes, bruising and swelling. No treatments tried PTA. Pt also reports h/o lower extremity weakness for the past 2 months which is being worked up due to numerous tick bites, intermittent back pain and a possible autoimmune disease.   The history is provided by the patient. No language interpreter was used.    Past Medical History:  Diagnosis Date  . Chronic pain of right knee 02/2013   followed by Orthopedist Dr. Eulah Pont  . Depression 12/25/2011  . Diabetes type 1, uncontrolled (HCC) 08/27/1997  . Diabetic neuropathy (HCC) 01/08/2012  . Fatty liver   . Foot deformity, acquired    little toes, toes nail are falling off, needs surgery, but cant happen till AIC < 9  . Hyperlipidemia LDL goal < 100 12/19/2011  . Insomnia 01/30/2012  . Superficial skin infection 12/18/2011   Recurrent for past 2 years     Patient Active Problem List   Diagnosis Date Noted  . Disorder of skin and subcutaneous tissue 08/02/2014  . Abnormal LFTs 06/09/2014  . Absence of menstruation 06/09/2014  . Anxiety disorder 06/09/2014  .  Depression 06/09/2014  . Acid reflux 06/09/2014  . Essential (primary) hypertension 06/09/2014  . Fatigue 06/09/2014  . Alopecia 06/09/2014  . Enlarged liver 06/09/2014  . Microalbuminuria 06/09/2014  . NASH (nonalcoholic steatohepatitis) 06/09/2014  . Awareness of heartbeats 06/09/2014  . Deafness, sensorineural 06/09/2014  . Diabetes mellitus type 1, uncontrolled (HCC) 06/09/2014  . Birth control 04/02/2014  . Paroxysmal supraventricular tachycardia (HCC) 03/05/2014  . Seborrheic dermatitis of scalp 02/20/2014  . Depression, major, recurrent (HCC) 06/09/2013  . Right knee injury 04/21/2013  . Diabetic retinopathy (HCC) 02/20/2013  . Diabetic retinitis (HCC) 02/20/2013  . Overweight (BMI 25.0-29.9) 11/20/2012  . Foot deformity, acquired   . Fatty liver   . Insomnia 01/30/2012  . Diabetic neuropathy (HCC) 01/08/2012  . Hyperlipemia 12/19/2011  . Prurigo nodularis 12/18/2011  . Controlled type 1 diabetes mellitus with mild nonproliferative retinopathy 08/27/1997    Past Surgical History:  Procedure Laterality Date  . WRIST SURGERY     rerouted tendons in wrist    OB History    No data available      Home Medications    Prior to Admission medications   Medication Sig Start Date End Date Taking? Authorizing Provider  amitriptyline (ELAVIL) 10 MG tablet Take 1-2 tabs po qhs 07/02/14   Historical Provider, MD  canagliflozin (INVOKANA) 100 MG TABS tablet  06/25/14   Historical Provider, MD  clonazePAM (KLONOPIN) 0.5 MG tablet Take 1 tablet (0.5 mg total) by mouth daily as needed for  anxiety. 09/12/15 09/11/16  Cleotis Nipper, MD  esomeprazole (NEXIUM) 40 MG capsule Take 40 mg by mouth daily.     Historical Provider, MD  insulin aspart (NOVOLOG) 100 UNIT/ML injection Inject 5 Units into the skin 3 (three) times daily before meals. Novolog TID. The goal of CBG is 150. Patient is to take additional 1 unit with every 50 of cbgs above 150. She is to take additional 1 unit for every 3  CHO per patient report( sees endocrinologist Dr.Smith). 10/30/12   Dede Query, MD  insulin glargine (LANTUS) 100 UNIT/ML injection Inject 50 Units into the skin at bedtime.  01/08/12   Melida Quitter, MD  ketoconazole (NIZORAL) 2 % shampoo Apply 1 application topically daily. 03/05/14   Donavan Foil, MD  medroxyPROGESTERone (DEPO-PROVERA) 150 MG/ML injection Inject 150 mg into the muscle every 3 (three) months.    Historical Provider, MD  metoprolol succinate (TOPROL XL) 25 MG 24 hr tablet Take 25 mg by mouth. 07/02/14 07/02/15  Historical Provider, MD  metoprolol succinate (TOPROL-XL) 25 MG 24 hr tablet 25 mg. 09/25/15   Historical Provider, MD  neomycin-bacitracin-polymyxin (NEOSPORIN) ointment Apply 1 application topically at bedtime. Apply to affected areas on arms    Historical Provider, MD  sertraline (ZOLOFT) 100 MG tablet TAKE 2 TABLETS BY MOUTH ONCE DAILY 03/16/16   Cleotis Nipper, MD  SUMAtriptan (IMITREX) 50 MG tablet TAKE 1 TABLET BY MOUTH AS NEEDED FOR MIGRAINE. MAY REPEAT DOSE IN 2 HOURS 10/01/15   Historical Provider, MD  ZETIA 10 MG tablet TAKE 1 TABLET (10 MG TOTAL) BY MOUTH DAILY. 11/22/15   Historical Provider, MD    Family History Family History  Problem Relation Age of Onset  . Diabetes Maternal Grandfather   . Depression Mother     Social History Social History  Substance Use Topics  . Smoking status: Never Smoker  . Smokeless tobacco: Never Used  . Alcohol use No    Allergies   Gabapentin (once-daily); Penicillins; and Statins   Review of Systems Review of Systems A complete 10 system review of systems was obtained and all systems are negative except as noted in the HPI and PMH.   Physical Exam Updated Vital Signs BP 93/66 (BP Location: Right Arm)   Pulse 98   Temp 98.1 F (36.7 C) (Oral)   Resp 22   SpO2 100%   Physical Exam  Constitutional: She is oriented to person, place, and time. She appears well-developed and well-nourished. No distress.  HENT:  Head:  Normocephalic and atraumatic.  Eyes: Conjunctivae and EOM are normal.  Neck: Neck supple. No tracheal deviation present.  Cardiovascular: Normal rate.   Pulmonary/Chest: Effort normal. No respiratory distress.  Musculoskeletal: Normal range of motion.  Swelling over the dorsum of the L foot with bruising Swelling extends into the 2nd through 5th toes  Neurological: She is alert and oriented to person, place, and time.  Skin: Skin is warm and dry.  Psychiatric: She has a normal mood and affect. Her behavior is normal.  Nursing note and vitals reviewed.   ED Treatments / Results  Labs (all labs ordered are listed, but only abnormal results are displayed) Labs Reviewed - No data to display  EKG  EKG Interpretation None       Radiology No results found.  Procedures Procedures (including critical care time) DIAGNOSTIC STUDIES: Oxygen Saturation is 100% on RA, normal by my interpretation.    COORDINATION OF CARE: 11:22 AM-Discussed treatment plan which includes f/u  with ortho with pt at bedside and pt agreed to plan.   Medications Ordered in ED Medications - No data to display   Initial Impression / Assessment and Plan / ED Course  I have reviewed the triage vital signs and the nursing notes.  Pertinent labs & imaging results that were available during my care of the patient were reviewed by me and considered in my medical decision making (see chart for details).  Clinical Course    Patient be referred to orthopedics.  Told to return here as needed.  Ice and elevate her foot.  Patient agrees the plan and all questions were answered  Final Clinical Impressions(s) / ED Diagnoses   Final diagnoses:  None    New Prescriptions New Prescriptions   No medications on file     Charlestine Night, PA-C 03/23/16 1145    Shaune Pollack, MD 03/23/16 2040

## 2016-03-23 NOTE — Discharge Instructions (Signed)
Return here as needed.  Follow-up with the orthopedist provided to follow-up with your primary care doctor.  Ice and elevate your foot

## 2016-04-04 ENCOUNTER — Encounter: Payer: Self-pay | Admitting: *Deleted

## 2016-04-04 DIAGNOSIS — M6281 Muscle weakness (generalized): Secondary | ICD-10-CM | POA: Insufficient documentation

## 2016-04-05 ENCOUNTER — Ambulatory Visit (INDEPENDENT_AMBULATORY_CARE_PROVIDER_SITE_OTHER): Payer: Medicaid Other | Admitting: Diagnostic Neuroimaging

## 2016-04-05 ENCOUNTER — Encounter: Payer: Self-pay | Admitting: Diagnostic Neuroimaging

## 2016-04-05 VITALS — BP 99/71 | HR 91 | Ht 63.5 in | Wt 124.0 lb

## 2016-04-05 DIAGNOSIS — R29898 Other symptoms and signs involving the musculoskeletal system: Secondary | ICD-10-CM | POA: Diagnosis not present

## 2016-04-05 DIAGNOSIS — E1042 Type 1 diabetes mellitus with diabetic polyneuropathy: Secondary | ICD-10-CM

## 2016-04-05 DIAGNOSIS — R292 Abnormal reflex: Secondary | ICD-10-CM

## 2016-04-05 NOTE — Progress Notes (Signed)
[facility]  PATIENT: Christine Dunn DOB: 03-08-1991  REFERRING CLINICIAN: Velazquez HISTORY FROM: patient and mother  REASON FOR VISIT: new consult    HISTORICAL  CHIEF COMPLAINT:  Chief Complaint  Patient presents with  . Other    rm 6, New Pt, Muscle weakness of legs, mom- Cathy, "severe weakness in legs and arms/hands. Cannot get up without pushing off; legs get wobbly on staris and have to hold on, for past 2 weeks"    HISTORY OF PRESENT ILLNESS:   25 year old right-handed female with history of type 1 diabetes, hypercalcemia, migraine, depression, anxiety, here for evaluation of upper and lower extremity numbness and weakness.  Over past 2 months patient has had gradual onset and progressive weakness in her lower extremities. Initially this started in her calves and ankles. Now this is affecting her proximal muscles of lower extremities. One week ago she noticed numbness and weakness in her hands. She is having difficulty carrying a gallon of milk or opening jars. She is having difficulty standing up from a chair and climbing steps. She is having to use her hands to push up and stand up.  No prodromal infections, traumas, accidents. Her diabetes has been very difficult to control throughout her life. Hemoglobin A1c is 10.2 but has been in this range for past 1-2 years.  Patient notes a "knot" in her right lower back regions. 2 months ago she had 3 tick bites, was tested for Lyme disease which was negative. 3 weeks ago patient tripped and fell and sprained her left ankle.    REVIEW OF SYSTEMS: Full 14 system review of systems performed and negative with exception of: Memory loss confusion headache dizziness insomnia sleepiness restless legs depression anxiety to much sleep none of sleep disinterest activity suicidal thoughts racing thoughts joint pain aching muscles increased thirst weight loss fatigue palpitations being sensation.  ALLERGIES: Allergies    Allergen Reactions  . Gabapentin (Once-Daily) Nausea Only    Dizziness   . Penicillins Hives and Swelling  . Statins Other (See Comments)    Elevated liver enzymes.    HOME MEDICATIONS: Outpatient Medications Prior to Visit  Medication Sig Dispense Refill  . amitriptyline (ELAVIL) 10 MG tablet Take 1-2 tabs po qhs    . canagliflozin (INVOKANA) 100 MG TABS tablet     . clonazePAM (KLONOPIN) 0.5 MG tablet Take 1 tablet (0.5 mg total) by mouth daily as needed for anxiety. 30 tablet 1  . ibuprofen (ADVIL,MOTRIN) 800 MG tablet Take 1 tablet (800 mg total) by mouth every 8 (eight) hours as needed. 21 tablet 0  . insulin aspart (NOVOLOG) 100 UNIT/ML injection Inject 5 Units into the skin 3 (three) times daily before meals. Novolog TID. The goal of CBG is 150. Patient is to take additional 1 unit with every 50 of cbgs above 150. She is to take additional 1 unit for every 3 CHO per patient report( sees endocrinologist Dr.Smith).    . insulin glargine (LANTUS) 100 UNIT/ML injection Inject 50 Units into the skin at bedtime.     . metoprolol succinate (TOPROL-XL) 25 MG 24 hr tablet 25 mg.  11  . neomycin-bacitracin-polymyxin (NEOSPORIN) ointment Apply 1 application topically at bedtime. Apply to affected areas on arms    . sertraline (ZOLOFT) 100 MG tablet TAKE 2 TABLETS BY MOUTH ONCE DAILY 60 tablet 2  . SUMAtriptan (IMITREX) 50 MG tablet TAKE 1 TABLET BY MOUTH AS NEEDED FOR MIGRAINE. MAY REPEAT DOSE IN 2 HOURS  2  .  traMADol (ULTRAM) 50 MG tablet Take 1 tablet (50 mg total) by mouth every 6 (six) hours as needed for severe pain. 15 tablet 0  . ZETIA 10 MG tablet TAKE 1 TABLET (10 MG TOTAL) BY MOUTH DAILY.  11  . esomeprazole (NEXIUM) 40 MG capsule Take 40 mg by mouth daily.     Marland Kitchen ketoconazole (NIZORAL) 2 % shampoo Apply 1 application topically daily. 120 mL 0  . medroxyPROGESTERone (DEPO-PROVERA) 150 MG/ML injection Inject 150 mg into the muscle every 3 (three) months.    . metoprolol succinate  (TOPROL XL) 25 MG 24 hr tablet Take 25 mg by mouth.     No facility-administered medications prior to visit.     PAST MEDICAL HISTORY: Past Medical History:  Diagnosis Date  . Chronic pain of right knee 02/2013   followed by Orthopedist Dr. Eulah Pont  . Depression 12/25/2011  . Diabetes type 1, uncontrolled (HCC) 08/27/1997  . Diabetic neuropathy (HCC) 01/08/2012  . Fatty liver   . Foot deformity, acquired    little toes, toes nail are falling off, needs surgery, but cant happen till AIC < 9  . Hyperlipidemia LDL goal < 100 12/19/2011  . Insomnia 01/30/2012  . Superficial skin infection 12/18/2011   Recurrent for past 2 years     PAST SURGICAL HISTORY: Past Surgical History:  Procedure Laterality Date  . WISDOM TOOTH EXTRACTION  2015  . WRIST SURGERY Right 2010   rerouted tendons in wrist    FAMILY HISTORY: Family History  Problem Relation Age of Onset  . Diabetes Maternal Grandfather   . Heart disease Maternal Grandfather   . Lymphoma Maternal Grandfather   . Depression Mother   . Irritable bowel syndrome Mother   . Gestational diabetes Mother   . Hyperlipidemia Father   . Alcohol abuse Brother   . Asthma Brother   . Drug abuse Brother     SOCIAL HISTORY:  Social History   Social History  . Marital status: Single    Spouse name: N/A  . Number of children: 0  . Years of education: 63   Occupational History  .      na   Social History Main Topics  . Smoking status: Never Smoker  . Smokeless tobacco: Never Used  . Alcohol use No  . Drug use: No  . Sexual activity: Not on file   Other Topics Concern  . Not on file   Social History Narrative   Lives with mom   Caffeine- soda daily     PHYSICAL EXAM  GENERAL EXAM/CONSTITUTIONAL: Vitals:  Vitals:   04/05/16 1321  BP: 99/71  Pulse: 91  Weight: 124 lb (56.2 kg)  Height: 5' 3.5" (1.613 m)     Body mass index is 21.62 kg/m.  Visual Acuity Screening   Right eye Left eye Both eyes  Without  correction:     With correction: 20/40 20/40      Patient is in no distress; well developed, nourished and groomed; neck is supple  CARDIOVASCULAR:  Examination of carotid arteries is normal; no carotid bruits  Regular rate and rhythm, no murmurs  Examination of peripheral vascular system by observation and palpation is normal  EYES:  Ophthalmoscopic exam of optic discs and posterior segments is normal; no papilledema or hemorrhages  MUSCULOSKELETAL:  Gait, strength, tone, movements noted in Neurologic exam below  NEUROLOGIC: MENTAL STATUS:  No flowsheet data found.  awake, alert, oriented to person, place and time  recent and remote memory  intact  normal attention and concentration  language fluent, comprehension intact, naming intact,   fund of knowledge appropriate  CRANIAL NERVE:   2nd - no papilledema on fundoscopic exam  2nd, 3rd, 4th, 6th - pupils ARE AND NON-REACTIVE, visual fields full to confrontation, extraocular muscles intact, no nystagmus  5th - facial sensation symmetric  7th - facial strength symmetric  8th - hearing intact  9th - palate elevates symmetrically, uvula midline  11th - shoulder shrug symmetric  12th - tongue protrusion midline  MOTOR:   normal bulk and tone  BUE (DELTOID 5, BICEPS 4+, TRICEPS 4, GRIP 3+)  BLE (HF 3+, KE/KF 4, DF 4); LEFT ANKLE DF LIMITED BY ANKLE SPRAIN    SENSORY:   normal and symmetric to light touch  DECR PP, TEMP AND VIB IN FEET AND ANKLES; DECR PP IN FINGER TIPS  COORDINATION:   finger-nose-finger, fine finger movements normal  REFLEXES:   deep tendon reflexes --> TRACE IN BUE; ABSENT IN BLE  GAIT/STATION:   HAS TO PUSH UP WITH HANDS TO STAND FROM SEAT; CANNOT STAND UP WITHOUT USING HANDS; narrow based gait; LIMPING ON LEFT ANKLE; SLIGHTLY UNSTEADY; romberg is negative    DIAGNOSTIC DATA (LABS, IMAGING, TESTING) - I reviewed patient records, labs, notes, testing and imaging  myself where available.  Lab Results  Component Value Date   WBC 10.6 (H) 02/03/2014   HGB 14.9 02/03/2014   HCT 44.0 02/03/2014   MCV 81.2 02/03/2014   PLT 319 02/03/2014      Component Value Date/Time   NA 140 02/03/2014 1305   K 3.9 02/03/2014 1305   CL 98 02/03/2014 1305   CO2 24 02/03/2014 1305   GLUCOSE 95 02/03/2014 1305   BUN 10 02/03/2014 1305   CREATININE 0.58 02/03/2014 1305   CREATININE 0.60 04/24/2012 1525   CALCIUM 10.0 02/03/2014 1305   PROT 8.4 (H) 02/03/2014 1305   ALBUMIN 4.0 02/03/2014 1305   AST 15 02/03/2014 1305   ALT 12 02/03/2014 1305   ALKPHOS 93 02/03/2014 1305   BILITOT 0.5 02/03/2014 1305   GFRNONAA >90 02/03/2014 1305   GFRNONAA >89 04/24/2012 1525   GFRAA >90 02/03/2014 1305   GFRAA >89 04/24/2012 1525   Lab Results  Component Value Date   CHOL 220 (H) 03/22/2014   HDL 62 03/22/2014   LDLCALC 132 (H) 03/22/2014   LDLDIRECT 288 (H) 04/24/2012   TRIG 132 03/22/2014   CHOLHDL 3.5 03/22/2014   Lab Results  Component Value Date   HGBA1C 7.3 01/13/2014   No results found for: [id number] Lab Results  Component Value Date   TSH 1.259 12/18/2011    03/28/13 CXR [I reviewed images myself and agree with interpretation. -VRP]  - No active cardiopulmonary disease.     ASSESSMENT AND PLAN  25 y.o. year old female here with progressive ascending weakness and numbness over past 2 months. Exam notable for proximal greater than distal muscle weakness, lower extremities more affected than upper extremities, with decreased sensation of the lower extremities and areflexia in the lower extremities. Findings suspicious for inflammatory peripheral nerve process vs myopathy.   Ddx: autoimmune neuropathy (CIDP), myopathy, diabetic neuropathy  1. Weakness of both lower extremities   2. Upper extremity weakness   3. Diabetic polyneuropathy associated with type 1 diabetes mellitus (HCC)   4. Areflexia      PLAN:  Orders Placed This Encounter    Procedures  . Vitamin B12  . CK  . Aldolase  .  NCV with EMG(electromyography)   Return for for NCV/EMG.    Suanne MarkerVIKRAM R. Oreoluwa Aigner, MD 04/05/2016, 2:01 PM Certified in Neurology, Neurophysiology and Neuroimaging  [facility] [address], [address] [place], [place] [address] [phone]

## 2016-04-05 NOTE — Patient Instructions (Signed)
Thank you for coming to see [place] at [facility]. I hope we have been able to provide you high quality care today.  You may receive a patient satisfaction survey over the next few weeks. We would appreciate your feedback and comments so that we may continue to improve ourselves and the health of our patients.  - I will check lab testing and EMG/NCS (electrical nerve testing)   ~~~~~~~~~~~~~~~~~~~~~~~~~~~~~~~~~~~~~~~~~~~~~~~~~~~~~~~~~~~~~~~~~  DR. Aneira Cavitt'S GUIDE TO HAPPY AND HEALTHY LIVING These are some of my general health and wellness recommendations. Some of them may apply to you better than others. Please use common sense as you try these suggestions and feel free to ask me any questions.   ACTIVITY/FITNESS Mental, social, emotional and physical stimulation are very important for brain and body health. Try learning a new activity (arts, music, language, sports, games).  Keep moving your body to the best of your abilities. You can do this at home, inside or outside, the park, community center, gym or anywhere you like. Consider a physical therapist or personal trainer to get started. Consider the app Sworkit. Fitness trackers such as smart-watches, smart-phones or Fitbits can help as well.   NUTRITION Eat more plants: colorful vegetables, nuts, seeds and berries.  Eat less sugar, salt, preservatives and processed foods.  Avoid toxins such as cigarettes and alcohol.  Drink water when you are thirsty. Warm water with a slice of lemon is an excellent morning drink to start the day.  Consider these websites for more information The Nutrition Source (https://www.henry-hernandez.biz/) Precision Nutrition (WindowBlog.ch)   RELAXATION Consider practicing mindfulness meditation or other relaxation techniques such as deep breathing, prayer, yoga, tai chi, massage. See website mindful.org or the apps Headspace or Calm to help get  started.   SLEEP Try to get at least 7-8+ hours sleep per day. Regular exercise and reduced caffeine will help you sleep better. Practice good sleep hygeine techniques. See website sleep.org for more information.   PLANNING Prepare estate planning, living will, healthcare POA documents. Sometimes this is best planned with the help of an attorney. Theconversationproject.org and agingwithdignity.org are excellent resources.

## 2016-04-06 LAB — CK: Total CK: 97 U/L (ref 24–173)

## 2016-04-06 LAB — VITAMIN B12: Vitamin B-12: 543 pg/mL (ref 211–946)

## 2016-04-06 LAB — ALDOLASE: Aldolase: 4.5 U/L (ref 3.3–10.3)

## 2016-04-09 ENCOUNTER — Telehealth: Payer: Self-pay | Admitting: *Deleted

## 2016-04-09 ENCOUNTER — Ambulatory Visit (INDEPENDENT_AMBULATORY_CARE_PROVIDER_SITE_OTHER): Payer: Medicaid Other

## 2016-04-09 ENCOUNTER — Encounter: Payer: Self-pay | Admitting: Podiatry

## 2016-04-09 ENCOUNTER — Ambulatory Visit (INDEPENDENT_AMBULATORY_CARE_PROVIDER_SITE_OTHER): Payer: Medicaid Other | Admitting: Podiatry

## 2016-04-09 DIAGNOSIS — E1042 Type 1 diabetes mellitus with diabetic polyneuropathy: Secondary | ICD-10-CM | POA: Diagnosis not present

## 2016-04-09 DIAGNOSIS — S99922A Unspecified injury of left foot, initial encounter: Secondary | ICD-10-CM

## 2016-04-09 DIAGNOSIS — S93602A Unspecified sprain of left foot, initial encounter: Secondary | ICD-10-CM

## 2016-04-09 NOTE — Telephone Encounter (Signed)
Per Dr Marjory LiesPenumalli, LVM informing patient her lab results are unremarkable, Vit B12 level is normal. Informed Dr Marjory LiesPenumalli will continue with current treatment plan, and she will see him for her NCS 05/10/16. Left name, number for any questions.

## 2016-04-09 NOTE — Progress Notes (Signed)
She presents today with her mother regarding numbness and tingling to her left foot after a recent fall. She is currently being followed by a neurologist is concerned about left-sided weakness. She states that all of her blood work has come back normal and she is awaiting and nerve conduction velocity exam.  Objective: Vital signs are stable she is alert and oriented 3. Pulses are palpable. Neurologic sensorium is intact. Slightly diminished protective sensation. Toes are painful in dorsiflexion plantar flexion. Radiographs demonstrate no fractures.  Assessment: Probable early neuropathy left foot however she does have a sprain to the lesser metatarsophalangeal joints there should go on to resolve. She will follow up with the neurologist.  Plan: Follow-up with neurology I expressed to her that more than likely this soreness in her toes should resolve if it does not resolve over the next 2-3 weeks or at least improve she's notify me immediately.

## 2016-05-10 ENCOUNTER — Encounter (INDEPENDENT_AMBULATORY_CARE_PROVIDER_SITE_OTHER): Payer: Self-pay | Admitting: Diagnostic Neuroimaging

## 2016-05-10 ENCOUNTER — Ambulatory Visit (INDEPENDENT_AMBULATORY_CARE_PROVIDER_SITE_OTHER): Payer: Medicaid Other | Admitting: Diagnostic Neuroimaging

## 2016-05-10 ENCOUNTER — Encounter: Payer: Medicaid Other | Admitting: Diagnostic Neuroimaging

## 2016-05-10 DIAGNOSIS — Z0289 Encounter for other administrative examinations: Secondary | ICD-10-CM

## 2016-05-10 DIAGNOSIS — R292 Abnormal reflex: Secondary | ICD-10-CM

## 2016-05-10 DIAGNOSIS — R29898 Other symptoms and signs involving the musculoskeletal system: Secondary | ICD-10-CM | POA: Diagnosis not present

## 2016-05-10 DIAGNOSIS — E1042 Type 1 diabetes mellitus with diabetic polyneuropathy: Secondary | ICD-10-CM

## 2016-05-10 NOTE — Procedures (Signed)
   [facility]  NCS (NERVE CONDUCTION STUDY) WITH EMG (ELECTROMYOGRAPHY) REPORT   STUDY DATE: 05/10/16 PATIENT NAME: Christine Dunn DOB: 1990/12/13 MRN: [id number]  ORDERING CLINICIAN: Joycelyn SchmidVikram Penumalli, MD   TECHNOLOGIST: Gearldine ShownLorraine Jones  ELECTROMYOGRAPHER: Glenford BayleyVikram R. Penumalli, MD  CLINICAL INFORMATION: 25 year old female with upper and lower extremity weakness, falls, diabetes, areflexia. Symptoms rapidly progressing over 2-3 months. Evaluate for CIDP.  FINDINGS: NERVE CONDUCTION STUDY: Left median and left ulnar motor responses and F wave latencies are normal. Left median and left ulnar sensory responses are normal.  Bilateral peroneal motor responses and F-wave latencies notable for prolonged distal latencies, decreased amplitudes and normal conduction velocities, and normal F-wave latencies.  Right tibial motor response is prolonged distal latency, normal amplitude, normal conduction velocity and normal F-wave latency. Left tibial motor response and F-wave latency are normal.  Bilateral peroneal sensory responses are normal.   NEEDLE EXAMINATION: Needle examination of right deltoid, right vastus medialis, right tibialis anterior, right gastrocnemius muscles is normal except right tibialis anterior muscle demonstrate 1+ positive sharp waves at rest. 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 Normal F-wave latencies and lack of back pain or radiculopathy symptoms decrease likelihood of concomitant lumbar radiculopathy, but is not excluded on the basis of this test. Correlate clinically.     INTERPRETING PHYSICIAN:  Suanne MarkerVIKRAM R. PENUMALLI, MD Certified in Neurology, Neurophysiology and Neuroimaging  [facility] [address], [address] [place], [place] [address] [phone]

## 2016-05-17 ENCOUNTER — Other Ambulatory Visit: Payer: Self-pay

## 2016-05-18 ENCOUNTER — Ambulatory Visit
Admission: RE | Admit: 2016-05-18 | Discharge: 2016-05-18 | Disposition: A | Discharge status: Home / Self Care | Payer: Medicaid Other | Source: Ambulatory Visit | Attending: Diagnostic Neuroimaging | Admitting: Diagnostic Neuroimaging

## 2016-05-18 DIAGNOSIS — R292 Abnormal reflex: Secondary | ICD-10-CM | POA: Diagnosis not present

## 2016-05-18 DIAGNOSIS — R29898 Other symptoms and signs involving the musculoskeletal system: Secondary | ICD-10-CM | POA: Diagnosis not present

## 2016-05-21 ENCOUNTER — Other Ambulatory Visit: Payer: Self-pay | Admitting: Diagnostic Neuroimaging

## 2016-05-21 ENCOUNTER — Ambulatory Visit
Admission: RE | Admit: 2016-05-21 | Discharge: 2016-05-21 | Disposition: A | Discharge status: Home / Self Care | Payer: Medicaid Other | Source: Ambulatory Visit | Attending: Diagnostic Neuroimaging | Admitting: Diagnostic Neuroimaging

## 2016-05-21 VITALS — BP 83/56 | HR 80

## 2016-05-21 DIAGNOSIS — R29898 Other symptoms and signs involving the musculoskeletal system: Secondary | ICD-10-CM

## 2016-05-21 DIAGNOSIS — R292 Abnormal reflex: Secondary | ICD-10-CM

## 2016-05-21 LAB — CSF CELL COUNT WITH DIFFERENTIAL
RBC Count, CSF: 0 cells/uL (ref 0–10)
WBC CSF: 1 {cells}/uL (ref 0–5)

## 2016-05-21 LAB — PROTEIN, CSF: TOTAL PROTEIN, CSF: 32 mg/dL (ref 15–45)

## 2016-05-21 LAB — GLUCOSE, CSF: GLUCOSE CSF: 119 mg/dL — AB (ref 43–76)

## 2016-05-21 NOTE — Discharge Instructions (Signed)

## 2016-05-21 NOTE — Progress Notes (Signed)
Blood drawn from right AC to go with spinal fluid. Discharge instructions explained to pt and mom. Pt tolerated procedure well, site is unremarkable. JKL RN

## 2016-05-23 ENCOUNTER — Telehealth: Payer: Self-pay

## 2016-05-23 LAB — CNS IGG SYNTHESIS RATE, CSF+BLOOD
ALBUMIN CSF: 14.7 mg/dL (ref 8.0–42.0)
Albumin, Serum(Neph): 3.8 g/dL (ref 3.7–5.1)
IgG Index, CSF: 0.57 (ref <0.66)
IgG, CSF: 2.3 mg/dL (ref 0.8–7.7)
IgG, Serum: 1050 mg/dL (ref 694–1618)
MS CNS IgG Synthesis Rate: -1.6 mg/24 h (ref <=3.3)

## 2016-05-23 NOTE — Telephone Encounter (Signed)
LMOM for patient to see how she is doing after her LP here 05/21/16.  jkl

## 2016-05-24 ENCOUNTER — Encounter (HOSPITAL_COMMUNITY): Payer: Self-pay | Admitting: Psychiatry

## 2016-05-24 ENCOUNTER — Ambulatory Visit (INDEPENDENT_AMBULATORY_CARE_PROVIDER_SITE_OTHER): Payer: Medicaid Other | Admitting: Psychiatry

## 2016-05-24 VITALS — BP 100/68 | HR 94 | Ht 63.75 in | Wt 126.8 lb

## 2016-05-24 DIAGNOSIS — F331 Major depressive disorder, recurrent, moderate: Secondary | ICD-10-CM | POA: Diagnosis not present

## 2016-05-24 LAB — CSF CULTURE
GRAM STAIN: NONE SEEN
ORGANISM ID, BACTERIA: NO GROWTH

## 2016-05-24 LAB — CSF CULTURE W GRAM STAIN: Gram Stain: NONE SEEN

## 2016-05-24 MED ORDER — SERTRALINE HCL 100 MG PO TABS: 200.0000 mg | ORAL_TABLET | Freq: Every day | ORAL | 2 refills | Status: DC

## 2016-05-24 NOTE — Progress Notes (Signed)
[facility] [id number] Progress Note  Christine Dunn [id number] 25 y.o.  05/24/2016 10:59 AM  Chief Complaint:  I'm feeling more tired than usual.  I'm see neurologist for headaches.        History of Present Illness:  Christine Dunn came for her appointment.  She is complaining of fatigue, tiredness and generalized weakness.  She also having headaches and recently she's seen her neurologist and had MRI and nerve test.  She also had spinal tap and her results pending.  Due to her physical condition she has been not able to look for a job.  She is still staying with her mother.  She is taking Zoloft and occasional Klonopin.  She reported her relationship with the boyfriend is going very well but due to her physical condition she has been unable to give enough time.  She denies any panic attack but she is worried about her physical condition.  She denies any crying spells, irritability, anger, mood swing or any paranoia.  She has no tremors or shakes.  Patient denies drinking or using any illegal substances.  Her appetite is fair.  Her energy level is fair.  She wants to continue Zoloft and occasional Klonopin as needed.  Suicidal Ideation: No Plan Formed: No Patient has means to carry out plan: No  Homicidal Ideation: No Plan Formed: No Patient has means to carry out plan: No  Medical History; Patient has diabetes mellitus, headache , neuropathy, hyperlipidemia.  [facility]   Past Psychiatric History/Hospitalization(s) Patient denies any history of suicidal attempt, inpatient psychiatric treatment, mania, psychosis or any hallucination.  She was seen by Dr. Lucianne MussKumar for depression a few years ago and prescribed Zoloft until 2 years ago it stopped working.  She endorses history of scratching herself causing injury to her skin. She denies any history of mood swing, aggression, violence or any impulsive behavior. In the past she has taken trazodone but did not  work. Anxiety: No Bipolar Disorder: No Depression: No Mania: No Psychosis: No Schizophrenia: No Personality Disorder: No Hospitalization for psychiatric illness: No History of Electroconvulsive Shock Therapy: No Prior Suicide Attempts: No   Review of Systems  Constitutional: Negative.   Skin: Negative for itching and rash.  Neurological: Positive for headaches. Negative for dizziness and tremors.  Psychiatric/Behavioral: Negative for hallucinations, substance abuse and suicidal ideas. The patient does not have insomnia.    Psychiatric: Agitation: No Hallucination: No Depressed Mood: No Insomnia: No Hypersomnia: No Altered Concentration: No Feels Worthless: No Grandiose Ideas: No Belief In Special Powers: No New/Increased Substance Abuse: No Compulsions: No  Neurologic: Headache: No Seizure: No Paresthesias: Yes    Outpatient Encounter Prescriptions as of 05/24/2016  Medication Sig  . amitriptyline (ELAVIL) 10 MG tablet Take 1-2 tabs po qhs  . canagliflozin (INVOKANA) 100 MG TABS tablet   . clonazePAM (KLONOPIN) 0.5 MG tablet Take 1 tablet (0.5 mg total) by mouth daily as needed for anxiety.  Marland Kitchen. ibuprofen (ADVIL,MOTRIN) 800 MG tablet Take 1 tablet (800 mg total) by mouth every 8 (eight) hours as needed.  . insulin aspart (NOVOLOG) 100 UNIT/ML injection Inject 5 Units into the skin 3 (three) times daily before meals. Novolog TID. The goal of CBG is 150. Patient is to take additional 1 unit with every 50 of cbgs above 150. She is to take additional 1 unit for every 3 CHO per patient report( sees endocrinologist Dr.Smith).  . insulin glargine (LANTUS) 100 UNIT/ML injection Inject 50 Units  into the skin at bedtime.   . metoprolol succinate (TOPROL-XL) 25 MG 24 hr tablet 25 mg.  . neomycin-bacitracin-polymyxin (NEOSPORIN) ointment Apply 1 application topically at bedtime. Apply to affected areas on arms  . sertraline (ZOLOFT) 100 MG tablet Take 2 tablets (200 mg total) by  mouth daily.  . SUMAtriptan (IMITREX) 50 MG tablet TAKE 1 TABLET BY MOUTH AS NEEDED FOR MIGRAINE. MAY REPEAT DOSE IN 2 HOURS  . traMADol (ULTRAM) 50 MG tablet Take 1 tablet (50 mg total) by mouth every 6 (six) hours as needed for severe pain.  Marland Kitchen ZETIA 10 MG tablet TAKE 1 TABLET (10 MG TOTAL) BY MOUTH DAILY.  . [DISCONTINUED] sertraline (ZOLOFT) 100 MG tablet TAKE 2 TABLETS BY MOUTH ONCE DAILY   No facility-administered encounter medications on file as of 05/24/2016.     Recent Results (from the past 2160 hour(s))  Vitamin B12     Status: None   Collection Time: 04/05/16  2:19 PM  Result Value Ref Range   Vitamin B-12 543 211 - 946 pg/mL  CK     Status: None   Collection Time: 04/05/16  2:19 PM  Result Value Ref Range   Total CK 97 24 - 173 U/L  Aldolase     Status: None   Collection Time: 04/05/16  2:19 PM  Result Value Ref Range   Aldolase 4.5 3.3 - 10.3 U/L  CSF cell count with differential     Status: None   Collection Time: 05/21/16 12:00 AM  Result Value Ref Range   Color, CSF COLORLESS COLORLESS   Appearance, CSF CLEAR CLEAR   RBC Count, CSF 0 0 - 10 cells/uL   WBC, CSF 1 0 - 5 cells/uL   Comment: SEE NOTE     Comment: Too few to count, smear available for review Rare lymphocyte noted on slide. Tube 3   Glucose, CSF     Status: Abnormal   Collection Time: 05/21/16 12:00 AM  Result Value Ref Range   Glucose, CSF 119 (H) 43 - 76 mg/dL  Protein, CSF     Status: None   Collection Time: 05/21/16 12:00 AM  Result Value Ref Range   Total Protein, CSF 32 15 - 45 mg/dL  Oligoclonal bands, CSF + serm     Status: None (Preliminary result)   Collection Time: 05/21/16 12:00 AM  Result Value Ref Range   CSF Oligoclonal Bands    CNS IgG synthesis rate, CSF+blood     Status: None   Collection Time: 05/21/16 12:00 AM  Result Value Ref Range   MS CNS IgG Synthesis Rate -1.6 -9.9 - 3.3 mg/24 h   IgG Index, CSF 0.57 <0.66   Albumin, CSF 14.7 8.0 - 42.0 mg/dL   IgG, CSF 2.3 0.8 -  7.7 mg/dL   IgG, Serum 1,610 960 - 1,618 mg/dL   Albumin, Serum(Neph) 3.8 3.7 - 5.1 g/dL  CSF culture     Status: None   Collection Time: 05/21/16 12:00 AM  Result Value Ref Range   Gram Stain No WBC Seen    Gram Stain No Organisms Seen    Organism ID, Bacteria NO GROWTH 3 DAYS       Physical Exam: Constitutional:  BP 100/68 (BP Location: Left Arm, Patient Position: Sitting, Cuff Size: Normal)   Pulse 94   Ht 5' 3.75" (1.619 m)   Wt 126 lb 12.8 oz (57.5 kg)   LMP 04/30/2016 Comment: NCP per pat.  BMI 21.94 kg/m   Musculoskeletal:  Strength & Muscle Tone: within normal limits Gait & Station: normal Patient leans: N/A  Mental Status Examination;  Patient is well groomed and well dressed. She is tired but cooperative.  She maintained fair eye contact. She describes her mood euthymic and her affect is appropriate.  She denies any active or passive suicidal partial homicidal thought. Her fund of knowledge is adequate. There were no tremors or shakes. She denies any hallucination, paranoia or any delusions. There were no flight of ideas or any loose association. Her thought processes is logical and goal directed. Her speech is clear and coherent. She is alert and oriented x3. Her insight judgment and impulse control is okay.   Established Problem, Stable/Improving (1), Review of Psycho-Social Stressors (1), Review or order clinical lab tests (1), Decision to obtain old records (1), Review and summation of old records (2), Review of Last Therapy Session (1) and Review of Medication Regimen & Side Effects (2)  Assessment: Axis I: Maj. depressive disorder, recurrent  Axis II: Deferred  Axis III:  Past Medical History:  Diagnosis Date  . Chronic pain of right knee 02/2013   followed by Orthopedist Dr. Eulah Pont  . Depression 12/25/2011  . Diabetes type 1, uncontrolled (HCC) 08/27/1997  . Diabetic neuropathy (HCC) 01/08/2012  . Fatty liver   . Foot deformity, acquired    little toes, toes  nail are falling off, needs surgery, but cant happen till AIC < 9  . Hyperlipidemia LDL goal < 100 12/19/2011  . Insomnia 01/30/2012  . Superficial skin infection 12/18/2011   Recurrent for past 2 years     Plan:  I reviewed records from neurology including recent blood work results and MRI.  It appears that her MRI is normal but she still waiting for spinal test results.  She does not want to change her try ADD medication.  She wants to continue Zoloft 200 mg daily and Klonopin as needed.  Discussed medication side effects and benefits.  She is seeing [name] for counseling.  Recommended to call us back if she has any question, concern if she feels worsening of the symptom.  Follow-up in 3 months. She is getting amitriptyline from her primary care physician for headaches.    Jasten Guyette T., MD 05/24/2016             Patient ID: Corrine L Wilton, female   DOB: [birth year], 25 y.o.   MRN: [id number]

## 2016-05-25 ENCOUNTER — Telehealth: Payer: Self-pay | Admitting: *Deleted

## 2016-05-25 LAB — OLIGOCLONAL BANDS, CSF + SERM

## 2016-05-25 NOTE — Telephone Encounter (Signed)
Per Dr Marjory LiesPenumalli, spoke with patient and informed her that her LP results are unremarkable. There are no signs of inflammation. Her glucose is high but is related to her diabetes.  She then questioned if she could be seen sooner due to having fallen three times since her NCS on 05/10/16. Moved her appt to 06/13/16; she verbalized understanding, appreciation.

## 2016-05-28 ENCOUNTER — Telehealth: Payer: Self-pay | Admitting: *Deleted

## 2016-05-28 NOTE — Telephone Encounter (Signed)
Per Dr Marjory LiesPenumalli, spoke with patient and informed her that her MRI lumbar spine results are unremarkable.  Advised Dr Magda PaganiniPneumallii will see her in FU 06/13/16, moved up at her request. She verbalized understanding, appreciation.

## 2016-05-31 ENCOUNTER — Encounter: Payer: Medicaid Other | Admitting: Diagnostic Neuroimaging

## 2016-06-13 ENCOUNTER — Ambulatory Visit (INDEPENDENT_AMBULATORY_CARE_PROVIDER_SITE_OTHER): Payer: Medicaid Other | Admitting: Diagnostic Neuroimaging

## 2016-06-13 ENCOUNTER — Encounter: Payer: Self-pay | Admitting: Diagnostic Neuroimaging

## 2016-06-13 VITALS — BP 128/90 | HR 117

## 2016-06-13 DIAGNOSIS — R269 Unspecified abnormalities of gait and mobility: Secondary | ICD-10-CM

## 2016-06-13 DIAGNOSIS — E1042 Type 1 diabetes mellitus with diabetic polyneuropathy: Secondary | ICD-10-CM

## 2016-06-13 NOTE — Progress Notes (Signed)
[facility]  PATIENT: Christine Dunn DOB: 23-Sep-1990  REFERRING CLINICIAN: Velazquez HISTORY FROM: patient and father  REASON FOR VISIT: follow up   HISTORICAL  CHIEF COMPLAINT:  Chief Complaint  Patient presents with  . Weakness of BLE    rm 6, fatherChanning Mutters, "Falls: Sept x 3, Oct x 1; feels like my legs are there and then they aren't"  . Follow-up    HISTORY OF PRESENT ILLNESS:   UPDATE 06/13/16: Since last visit, continues with balance and walking issues. Has fallen down 3-4 times since last visit. EMG and LP show no evidence for CIDP. EMG showed generalized axonal neuropathy.   PRIOR HPI (04/05/16): 25 year old right-handed female with history of type 1 diabetes, hypercalcemia, migraine, depression, anxiety, here for evaluation of upper and lower extremity numbness and weakness. Over past 2 months patient has had gradual onset and progressive weakness in her lower extremities. Initially this started in her calves and ankles. Now this is affecting her proximal muscles of lower extremities. One week ago she noticed numbness and weakness in her hands. She is having difficulty carrying a gallon of milk or opening jars. She is having difficulty standing up from a chair and climbing steps. She is having to use her hands to push up and stand up. No prodromal infections, traumas, accidents. Her diabetes has been very difficult to control throughout her life. Hemoglobin A1c is 10.2 but has been in this range for past 1-2 years. Patient notes a "knot" in her right lower back regions. 2 months ago she had 3 tick bites, was tested for Lyme disease which was negative. 3 weeks ago patient tripped and fell and sprained her left ankle.    REVIEW OF SYSTEMS: Full 14 system review of systems performed and negative with exception of: Memory loss confusion headache dizziness insomnia sleepiness restless legs depression anxiety to much sleep none of sleep disinterest activity suicidal  thoughts racing thoughts joint pain aching muscles increased thirst weight loss fatigue palpitations being sensation.  ALLERGIES: Allergies  Allergen Reactions  . Gabapentin (Once-Daily) Nausea Only    Dizziness   . Penicillins Hives and Swelling  . Statins Other (See Comments)    Elevated liver enzymes.    HOME MEDICATIONS: Outpatient Medications Prior to Visit  Medication Sig Dispense Refill  . amitriptyline (ELAVIL) 10 MG tablet Take 1-2 tabs po qhs    . canagliflozin (INVOKANA) 100 MG TABS tablet     . clonazePAM (KLONOPIN) 0.5 MG tablet Take 1 tablet (0.5 mg total) by mouth daily as needed for anxiety. 30 tablet 1  . ibuprofen (ADVIL,MOTRIN) 800 MG tablet Take 1 tablet (800 mg total) by mouth every 8 (eight) hours as needed. 21 tablet 0  . insulin aspart (NOVOLOG) 100 UNIT/ML injection Inject 5 Units into the skin 3 (three) times daily before meals. Novolog TID. The goal of CBG is 150. Patient is to take additional 1 unit with every 50 of cbgs above 150. She is to take additional 1 unit for every 3 CHO per patient report( sees endocrinologist Dr.Smith).    . insulin glargine (LANTUS) 100 UNIT/ML injection Inject 50 Units into the skin at bedtime.     . metoprolol succinate (TOPROL-XL) 25 MG 24 hr tablet 25 mg.  11  . neomycin-bacitracin-polymyxin (NEOSPORIN) ointment Apply 1 application topically at bedtime. Apply to affected areas on arms    . sertraline (ZOLOFT) 100 MG tablet Take 2 tablets (200 mg total) by mouth daily. 60 tablet 2  .  SUMAtriptan (IMITREX) 50 MG tablet TAKE 1 TABLET BY MOUTH AS NEEDED FOR MIGRAINE. MAY REPEAT DOSE IN 2 HOURS  2  . traMADol (ULTRAM) 50 MG tablet Take 1 tablet (50 mg total) by mouth every 6 (six) hours as needed for severe pain. 15 tablet 0  . ZETIA 10 MG tablet TAKE 1 TABLET (10 MG TOTAL) BY MOUTH DAILY.  11   No facility-administered medications prior to visit.     PAST MEDICAL HISTORY: Past Medical History:  Diagnosis Date  . Chronic pain  of right knee 02/2013   followed by Orthopedist Dr. Eulah Pont  . Depression 12/25/2011  . Diabetes type 1, uncontrolled (HCC) 08/27/1997  . Diabetic neuropathy (HCC) 01/08/2012  . Fatty liver   . Foot deformity, acquired    little toes, toes nail are falling off, needs surgery, but cant happen till AIC < 9  . Frequent falls    due to weakness of bilateral LE  . Hyperlipidemia LDL goal < 100 12/19/2011  . Insomnia 01/30/2012  . Superficial skin infection 12/18/2011   Recurrent for past 2 years     PAST SURGICAL HISTORY: Past Surgical History:  Procedure Laterality Date  . WISDOM TOOTH EXTRACTION  2015  . WRIST SURGERY Right 2010   rerouted tendons in wrist    FAMILY HISTORY: Family History  Problem Relation Age of Onset  . Diabetes Maternal Grandfather   . Heart disease Maternal Grandfather   . Lymphoma Maternal Grandfather   . Depression Mother   . Irritable bowel syndrome Mother   . Gestational diabetes Mother   . Hyperlipidemia Father   . Alcohol abuse Brother   . Asthma Brother   . Drug abuse Brother     SOCIAL HISTORY:  Social History   Social History  . Marital status: Single    Spouse name: N/A  . Number of children: 0  . Years of education: 59   Occupational History  .      na   Social History Main Topics  . Smoking status: Never Smoker  . Smokeless tobacco: Never Used  . Alcohol use No  . Drug use: No  . Sexual activity: Yes    Partners: Male    Birth control/ protection: Condom   Other Topics Concern  . Not on file   Social History Narrative   Lives with mom   Caffeine- soda daily     PHYSICAL EXAM  GENERAL EXAM/CONSTITUTIONAL: Vitals:  Vitals:   06/13/16 1133 06/13/16 1134  BP: (!) 128/91 128/90  Pulse: (!) 124 (!) 117   There is no height or weight on file to calculate BMI. No exam data present  Patient is in no distress; well developed, nourished and groomed; neck is supple  CARDIOVASCULAR:  Examination of carotid arteries is  normal; no carotid bruits  Regular rate and rhythm, no murmurs  Examination of peripheral vascular system by observation and palpation is normal  EYES:  Ophthalmoscopic exam of optic discs and posterior segments is normal; no papilledema or hemorrhages  MUSCULOSKELETAL:  Gait, strength, tone, movements noted in Neurologic exam below  NEUROLOGIC: MENTAL STATUS:  No flowsheet data found.  awake, alert, oriented to person, place and time  recent and remote memory intact  normal attention and concentration  language fluent, comprehension intact, naming intact,   fund of knowledge appropriate  CRANIAL NERVE:   2nd - no papilledema on fundoscopic exam  2nd, 3rd, 4th, 6th - pupils ARE AND NON-REACTIVE, visual fields full to  confrontation, extraocular muscles intact, no nystagmus  5th - facial sensation symmetric  7th - facial strength symmetric  8th - hearing intact  9th - palate elevates symmetrically, uvula midline  11th - shoulder shrug symmetric  12th - tongue protrusion midline  MOTOR:   normal bulk and tone  BUE 5, BLE 5  SENSORY:   normal and symmetric to light touch  DECR PP, TEMP AND VIB IN FEET AND ANKLES  COORDINATION:   finger-nose-finger, fine finger movements normal  REFLEXES:   deep tendon reflexes --> ABSENT IN BUE AND BLE  GAIT/STATION:   narrow based gait; SLIGHTLY CAUTIOUS GAIT; ABLE TO TANDEM, TOE AND HEEL WALK; romberg is negative    DIAGNOSTIC DATA (LABS, IMAGING, TESTING) - I reviewed patient records, labs, notes, testing and imaging myself where available.  Lab Results  Component Value Date   WBC 10.6 (H) 02/03/2014   HGB 14.9 02/03/2014   HCT 44.0 02/03/2014   MCV 81.2 02/03/2014   PLT 319 02/03/2014      Component Value Date/Time   NA 140 02/03/2014 1305   K 3.9 02/03/2014 1305   CL 98 02/03/2014 1305   CO2 24 02/03/2014 1305   GLUCOSE 95 02/03/2014 1305   BUN 10 02/03/2014 1305   CREATININE 0.58  02/03/2014 1305   CREATININE 0.60 04/24/2012 1525   CALCIUM 10.0 02/03/2014 1305   PROT 8.4 (H) 02/03/2014 1305   ALBUMIN 4.0 02/03/2014 1305   AST 15 02/03/2014 1305   ALT 12 02/03/2014 1305   ALKPHOS 93 02/03/2014 1305   BILITOT 0.5 02/03/2014 1305   GFRNONAA >90 02/03/2014 1305   GFRNONAA >89 04/24/2012 1525   GFRAA >90 02/03/2014 1305   GFRAA >89 04/24/2012 1525   Lab Results  Component Value Date   CHOL 220 (H) 03/22/2014   HDL 62 03/22/2014   LDLCALC 132 (H) 03/22/2014   LDLDIRECT 288 (H) 04/24/2012   TRIG 132 03/22/2014   CHOLHDL 3.5 03/22/2014   Lab Results  Component Value Date   HGBA1C 7.3 01/13/2014   Lab Results  Component Value Date   VITAMINB12 543 04/05/2016   Lab Results  Component Value Date   TSH 1.259 12/18/2011    03/28/13 CXR [I reviewed images myself and agree with interpretation. -VRP]  - No active cardiopulmonary disease.  05/10/16 EMG/NCS - Abnormal study demonstrating mild axonal motor neuropathy affecting lower extremities. Normal F-wave latencies and lack of back pain or radiculopathy symptoms decrease likelihood of concomitant lumbar radiculopathy, but is not excluded on the basis of this test. Correlate clinically.   05/21/16 LUMBAR PUNCTURE - CSF WBC 1, RBC 0, protein 32, glucose 112, OCB negative  Lab Results  Component Value Date   CKTOTAL 97 04/05/2016   CKMB 1.0 05/30/2010   TROPONINI 0.04        NO INDICATION OF MYOCARDIAL INJURY. 05/30/2010     ASSESSMENT AND PLAN  25 y.o. year old female here with progressive ascending weakness and numbness since 2 months. Exam notable for mild generalized weakness, lower extremities more affected than upper extremities, with decreased sensation of the lower extremities and areflexia. Findings consistent with axonal peripheral neuropathy, likely related to diabetes.    Dx: diabetic neuropathy  1. Diabetic polyneuropathy associated with type 1 diabetes mellitus (HCC)   2. Gait difficulty       PLAN: - continue diabetes control  Orders Placed This Encounter  Procedures  . Ambulatory referral to Physical Therapy   Return in about 3 months (around  09/13/2016).    Suanne Marker, MD 06/13/2016, 12:02 PM Certified in Neurology, Neurophysiology and Neuroimaging  [facility] [address], [address] [place], [place] [address] [phone]

## 2016-07-03 ENCOUNTER — Ambulatory Visit: Payer: Medicaid Other | Admitting: Diagnostic Neuroimaging

## 2016-07-11 ENCOUNTER — Ambulatory Visit: Payer: Medicaid Other | Admitting: Podiatry

## 2016-07-23 ENCOUNTER — Ambulatory Visit (INDEPENDENT_AMBULATORY_CARE_PROVIDER_SITE_OTHER): Payer: Medicaid Other | Admitting: Podiatry

## 2016-07-23 DIAGNOSIS — E1042 Type 1 diabetes mellitus with diabetic polyneuropathy: Secondary | ICD-10-CM

## 2016-07-23 NOTE — Progress Notes (Signed)
Christine Dunn presents today for follow-up of her pain in her feet. She states that she is seeing a neurologist and he feels that it is diabetic peripheral neuropathy as well. She states that her falls have become more frequent. Relates that her last hemoglobin A1c was at 10.8. She will be following up with her physical therapy group in the near future for gait training balance training and strength training.  Objective: Vital signs are stable she is alert and oriented 3. Pulses remain strong and palpable bilateral capillary fill time is immediate. For the first time she is really starting to show considerable weakness with ambulation. Otherwise physical exam is unremarkable no open lesions or wounds.  Assessment: Worsening of her diabetes and her peripheral neuropathy.  Plan: Recommend that she continue to follow up with her neurologist and physical therapy at this point there is really nothing I can do other than to evaluate her every 6 months.

## 2016-07-24 ENCOUNTER — Other Ambulatory Visit: Payer: Self-pay | Admitting: Neurology

## 2016-07-24 DIAGNOSIS — R4189 Other symptoms and signs involving cognitive functions and awareness: Secondary | ICD-10-CM

## 2016-08-08 ENCOUNTER — Ambulatory Visit
Admission: RE | Admit: 2016-08-08 | Discharge: 2016-08-08 | Disposition: A | Discharge status: Home / Self Care | Payer: Medicaid Other | Source: Ambulatory Visit | Attending: Neurology | Admitting: Neurology

## 2016-08-08 ENCOUNTER — Encounter: Payer: Self-pay | Admitting: Radiology

## 2016-08-08 DIAGNOSIS — R4189 Other symptoms and signs involving cognitive functions and awareness: Secondary | ICD-10-CM | POA: Insufficient documentation

## 2016-08-30 ENCOUNTER — Ambulatory Visit (HOSPITAL_COMMUNITY): Payer: Self-pay | Admitting: Psychiatry

## 2016-09-04 LAB — OB RESULTS CONSOLE ANTIBODY SCREEN: Antibody Screen: NEGATIVE

## 2016-09-04 LAB — OB RESULTS CONSOLE RPR: RPR: NONREACTIVE

## 2016-09-04 LAB — OB RESULTS CONSOLE GC/CHLAMYDIA
Chlamydia: NEGATIVE
Gonorrhea: NEGATIVE

## 2016-09-04 LAB — OB RESULTS CONSOLE ABO/RH: RH TYPE: POSITIVE

## 2016-09-04 LAB — OB RESULTS CONSOLE HIV ANTIBODY (ROUTINE TESTING): HIV: NONREACTIVE

## 2016-09-04 LAB — OB RESULTS CONSOLE RUBELLA ANTIBODY, IGM: Rubella: IMMUNE

## 2016-09-04 LAB — OB RESULTS CONSOLE HEPATITIS B SURFACE ANTIGEN: Hepatitis B Surface Ag: NEGATIVE

## 2016-09-07 ENCOUNTER — Ambulatory Visit (INDEPENDENT_AMBULATORY_CARE_PROVIDER_SITE_OTHER): Payer: Medicaid Other | Admitting: Psychiatry

## 2016-09-07 ENCOUNTER — Encounter (HOSPITAL_COMMUNITY): Payer: Self-pay | Admitting: Psychiatry

## 2016-09-07 VITALS — BP 108/72 | HR 103 | Ht 63.75 in | Wt 140.6 lb

## 2016-09-07 DIAGNOSIS — Z813 Family history of other psychoactive substance abuse and dependence: Secondary | ICD-10-CM

## 2016-09-07 DIAGNOSIS — Z811 Family history of alcohol abuse and dependence: Secondary | ICD-10-CM | POA: Diagnosis not present

## 2016-09-07 DIAGNOSIS — Z8249 Family history of ischemic heart disease and other diseases of the circulatory system: Secondary | ICD-10-CM | POA: Diagnosis not present

## 2016-09-07 DIAGNOSIS — Z880 Allergy status to penicillin: Secondary | ICD-10-CM

## 2016-09-07 DIAGNOSIS — Z833 Family history of diabetes mellitus: Secondary | ICD-10-CM | POA: Diagnosis not present

## 2016-09-07 DIAGNOSIS — F331 Major depressive disorder, recurrent, moderate: Secondary | ICD-10-CM | POA: Diagnosis not present

## 2016-09-07 DIAGNOSIS — Z818 Family history of other mental and behavioral disorders: Secondary | ICD-10-CM

## 2016-09-07 DIAGNOSIS — Z794 Long term (current) use of insulin: Secondary | ICD-10-CM

## 2016-09-07 DIAGNOSIS — Z825 Family history of asthma and other chronic lower respiratory diseases: Secondary | ICD-10-CM

## 2016-09-07 DIAGNOSIS — Z888 Allergy status to other drugs, medicaments and biological substances status: Secondary | ICD-10-CM

## 2016-09-07 NOTE — Progress Notes (Signed)
BH MD/PA/NP OP Progress Note  09/07/2016 8:59 AM Christine Dunn  MRN:  [id number]  Chief Complaint:  Chief Complaint    Follow-up     Subjective:  I am [redacted] weeks pregnant.  I stop taking Zoloft and Klonopin.  HPI: [name] came for her follow-up appointment.  She is excited because she is 9 weeks' pregnant.  This is her first pregnancy.  Her OB/GYN recommended to stop the Zoloft Klonopin and other medication.  Currently she is taking only insulin and meclizine.  Initially she was shocked because she was not planning to get pregnant however her OB/GYN stopped her contraceptive injection because she wanted to do more testing.  She is taking this news with great excitement.  She's also getting married in March.  She mentioned her boyfriend's family is very supportive.  Her eye feature husband is also very happy and supportive.  Patient denies any panic attack but she admitted since she stopped the Klonopin and Zoloft she feels some time very nervous and anxious.  However she denies any panic attack.  She is seeing [name] for counseling and she has planned to continue therapy and counseling.  She is getting regular follow-up with OB/GYN.  Her ultrasound is scheduled on the 31st.  She will see her OB/GYN on 17th.  Recently she saw a podiatrist because of foot pain.  She is no longer taking any pain medication.  She is sleeping good.  Sometimes she had nausea but otherwise she is handling the pregnancy very well.  She denies any irritability, mania, psychosis, hallucination.  Patient has chronic pain due to her physical condition she has been not able to work.  Patient denies drinking alcohol or using any illegal substances.   Visit Diagnosis:    ICD-9-CM ICD-10-CM   1. Major depressive disorder, recurrent episode, moderate (HCC) 296.32 F33.1     Past Psychiatric History: Patient denies any history of suicidal attempt, inpatient psychiatric treatment, mania, psychosis or any paranoid behavior.  She was seen  Dr. Kumar 2 years ago and prescribed Zoloft.  She has a history of scratching herself causing injury to her skin.  She had tried trazodone in the past for insomnia but it did not work.  She was taking Zoloft 200 mg and Klonopin 0.5 until recently medicine stopped due to her first pregnancy.  Past Medical History:  Past Medical History:  Diagnosis Date  . Chronic pain of right knee 02/2013   followed by Orthopedist Dr. Eulah Pont  . Depression 12/25/2011  . Diabetes type 1, uncontrolled (HCC) 08/27/1997  . Diabetic neuropathy (HCC) 01/08/2012  . Fatty liver   . Foot deformity, acquired    little toes, toes nail are falling off, needs surgery, but cant happen till AIC < 9  . Frequent falls    due to weakness of bilateral LE  . Hyperlipidemia LDL goal < 100 12/19/2011  . Insomnia 01/30/2012  . Superficial skin infection 12/18/2011   Recurrent for past 2 years     Past Surgical History:  Procedure Laterality Date  . WISDOM TOOTH EXTRACTION  2015  . WRIST SURGERY Right 2010   rerouted tendons in wrist    Family Psychiatric History: Reviewed.  Family History:  Family History  Problem Relation Age of Onset  . Diabetes Maternal Grandfather   . Heart disease Maternal Grandfather   . Lymphoma Maternal Grandfather   . Depression Mother   . Irritable bowel syndrome Mother   . Gestational diabetes Mother   . Hyperlipidemia  Father   . Alcohol abuse Brother   . Asthma Brother   . Drug abuse Brother     Social History:  Social History   Social History  . Marital status: Single    Spouse name: N/A  . Number of children: 0  . Years of education: 8816   Occupational History  .      na   Social History Main Topics  . Smoking status: Never Smoker  . Smokeless tobacco: Never Used  . Alcohol use No  . Drug use: No  . Sexual activity: Yes    Partners: Male    Birth control/ protection: None   Other Topics Concern  . None   Social History Narrative   Lives with mom   Caffeine- soda  daily    Allergies:  Allergies  Allergen Reactions  . Gabapentin (Once-Daily) Nausea Only    Dizziness   . Penicillins Hives and Swelling  . Statins Other (See Comments)    Elevated liver enzymes.    Metabolic Disorder Labs: Lab Results  Component Value Date   HGBA1C 7.3 01/13/2014   MPG 346 (H) 12/18/2011   MPG 283 (H) 05/31/2010   No results found for: PROLACTIN Lab Results  Component Value Date   CHOL 220 (H) 03/22/2014   TRIG 132 03/22/2014   HDL 62 03/22/2014   CHOLHDL 3.5 03/22/2014   VLDL 26 03/22/2014   LDLCALC 132 (H) 03/22/2014   LDLCALC 139 (H) 02/05/2014     Current Medications: Current Outpatient Prescriptions  Medication Sig Dispense Refill  . insulin aspart (NOVOLOG) 100 UNIT/ML injection Inject 5 Units into the skin 3 (three) times daily before meals. Novolog TID. The goal of CBG is 150. Patient is to take additional 1 unit with every 50 of cbgs above 150. She is to take additional 1 unit for every 3 CHO per patient report( sees endocrinologist Dr.Smith).    . insulin glargine (LANTUS) 100 UNIT/ML injection Inject 50 Units into the skin at bedtime.     . meclizine (ANTIVERT) 12.5 MG tablet Take by mouth.    Marland Kitchen. ibuprofen (ADVIL,MOTRIN) 800 MG tablet Take 1 tablet (800 mg total) by mouth every 8 (eight) hours as needed. (Patient not taking: Reported on 09/07/2016) 21 tablet 0  . neomycin-bacitracin-polymyxin (NEOSPORIN) ointment Apply 1 application topically at bedtime. Apply to affected areas on arms     No current facility-administered medications for this visit.     Neurologic: Headache: Yes Seizure: No Paresthesias: No  Musculoskeletal: Strength & Muscle Tone: within normal limits Gait & Station: normal Patient leans: N/A  Psychiatric Specialty Exam: Review of Systems  Constitutional: Negative.        Weight gain which could be due to pregnancy   HENT: Negative.   Gastrointestinal: Positive for nausea.  Musculoskeletal:       Foot pain   Skin: Negative.   Psychiatric/Behavioral: Negative.     Blood pressure 108/72, pulse (!) 103, height 5' 3.75" (1.619 m), weight 140 lb 9.6 oz (63.8 kg), last menstrual period 04/30/2016.Body mass index is 24.32 kg/m.  General Appearance: Casual  Eye Contact:  Good  Speech:  Clear and Coherent  Volume:  Normal  Mood:  Euthymic  Affect:  Appropriate  Thought Process:  Coherent  Orientation:  Full (Time, Place, and Person)  Thought Content: WDL and Logical   Suicidal Thoughts:  No  Homicidal Thoughts:  No  Memory:  Immediate;   Good Recent;   Good Remote;   Good  Judgement:  Good  Insight:  Good  Psychomotor Activity:  Normal  Concentration:  Concentration: Good and Attention Span: Good  Recall:  Good  Fund of Knowledge: Good  Language: Good  Akathisia:  No  Handed:  Right  AIMS (if indicated):  0  Assets:  Communication Skills Desire for Improvement Financial Resources/Insurance Housing Intimacy Physical Health Resilience Social Support  ADL's:  Intact  Cognition: WNL  Sleep:  Adequate    Assessment: Major depressive disorder, recurrent.  Anxiety disorder NOS.  Plan: Patient is currently pregnant with 9 weeks.  She stopped taking her Zoloft and Klonopin as per recommendation from OB/GYN.  Discuss in detail about medication concerns and teratogenic side effects.  Encouraged to keep appointment with [facility] for counseling.  Patient had a good support from her boyfriend , his family and her own family.  She is getting married in March.  She is regularly seeing her OB/GYN Dr. Elon Spanner.  I encouraged to discuss with her OB/GYN if she consider to go back on antidepressant in the future.  I also encouraged to call us back if symptoms started to get worse.  Discuss safety plan that anytime having active suicidal thoughts or homicidal thoughts and she need to call 911 or go to the local emergency room.  Follow-up in 3 months.   Rayanne Padmanabhan T., MD 09/07/2016, 8:59 AM

## 2016-09-11 LAB — OB RESULTS CONSOLE GBS: STREP GROUP B AG: POSITIVE

## 2016-09-17 ENCOUNTER — Ambulatory Visit: Payer: Medicaid Other | Attending: Diagnostic Neuroimaging | Admitting: Rehabilitation

## 2016-09-17 ENCOUNTER — Encounter: Payer: Self-pay | Admitting: Rehabilitation

## 2016-09-17 DIAGNOSIS — R208 Other disturbances of skin sensation: Secondary | ICD-10-CM | POA: Insufficient documentation

## 2016-09-17 DIAGNOSIS — M6281 Muscle weakness (generalized): Secondary | ICD-10-CM | POA: Diagnosis present

## 2016-09-17 DIAGNOSIS — R2681 Unsteadiness on feet: Secondary | ICD-10-CM | POA: Insufficient documentation

## 2016-09-17 DIAGNOSIS — R2689 Other abnormalities of gait and mobility: Secondary | ICD-10-CM | POA: Insufficient documentation

## 2016-09-17 NOTE — Therapy (Signed)
[facility] [address] [place], [place], [address] Phone: [phone]   Fax:  [phone]  Physical Therapy Evaluation  Patient Details  Name: Christine Dunn MRN: [id number] Date of Birth: 27-Jan-1991 Referring Provider: Joycelyn Schmid, MD  Encounter Date: 09/17/2016      PT End of Session - 09/17/16 1307    Visit Number 1   Number of Visits 4   Date for PT Re-Evaluation 12/16/16   Authorization Type MCD (awaiting approval)   PT Start Time 1101   PT Stop Time 1146   PT Time Calculation (min) 45 min   Activity Tolerance Patient tolerated treatment well   Behavior During Therapy [facility] for tasks assessed/performed      Past Medical History:  Diagnosis Date  . Chronic pain of right knee 02/2013   followed by Orthopedist Dr. Eulah Pont  . Depression 12/25/2011  . Diabetes type 1, uncontrolled (HCC) 08/27/1997  . Diabetic neuropathy (HCC) 01/08/2012  . Fatty liver   . Foot deformity, acquired    little toes, toes nail are falling off, needs surgery, but cant happen till AIC < 9  . Frequent falls    due to weakness of bilateral LE  . Hyperlipidemia LDL goal < 100 12/19/2011  . Insomnia 01/30/2012  . Superficial skin infection 12/18/2011   Recurrent for past 2 years     Past Surgical History:  Procedure Laterality Date  . WISDOM TOOTH EXTRACTION  2015  . WRIST SURGERY Right 2010   rerouted tendons in wrist    There were no vitals filed for this visit.       Subjective Assessment - 09/17/16 1106    Subjective "About six months ago, I started noticing getting weaker even with walking (just normal routine).  I usually am really active with my dog in the back yard and I did it a few months ago and fell and I have been falling more and more.  I wouldn't trip over things, I just fall down."    Limitations House hold activities;Walking   How long can you walk comfortably? walking slow-approx 30 mins   Patient Stated Goals  "to not fall"    Currently in Pain? No/denies            [facility] PT Assessment - 09/17/16 0001      Assessment   Medical Diagnosis diabetic neuropathy   Referring Provider Joycelyn Schmid, MD   Onset Date/Surgical Date --  about 6 months ago     Precautions   Precautions Fall   Precaution Comments Note pt is approx [redacted] weeks pregnant     Balance Screen   Has the patient fallen in the past 6 months Yes   How many times? 7   Has the patient had a decrease in activity level because of a fear of falling?  Yes   Is the patient reluctant to leave their home because of a fear of falling?  No     Home Environment   Living Environment Private residence   Living Arrangements Other relatives;Parent  grandmother lives next door, lives with mother   Available Help at Discharge Family;Available 24 hours/day   Type of Home House   Home Access Stairs to enter   Entrance Stairs-Number of Steps 4   Entrance Stairs-Rails None   Home Layout One level     Prior Function   Level of Independence Independent   Vocation Unemployed   Leisure play in the yard with dog,  paint     Cognition   Overall Cognitive Status Impaired/Different from baseline   Area of Impairment Memory   Memory Decreased short-term memory   Problem Solving Impaired   Problem Solving Impairment Verbal basic     Sensation   Light Touch Impaired Detail   Light Touch Impaired Details Impaired RLE;Impaired LLE   Stereognosis Impaired by gross assessment  seems to be more sensation related   Hot/Cold Appears Intact   Additional Comments history of diabetic neuropathy     Coordination   Gross Motor Movements are Fluid and Coordinated Yes   Fine Motor Movements are Fluid and Coordinated Yes   Heel Shin Test somewhat decreased fluidity esp on R but grossly WFL     ROM / Strength   AROM / PROM / Strength Strength     Strength   Overall Strength Deficits   Overall Strength Comments R hip flex 2+/5 (in sitting), R  knee ext 4/5, R knee flex 3+/5, R ankle PF/DF 5/5; L hip flex 4/5, L knee ext 4/5, L knee flex 4/5, L ankle DF/PF 4/5     Transfers   Transfers Sit to Stand;Stand to Sit   Sit to Stand 7: Independent   Five time sit to stand comments  15.72 with intermittent UE support on lap   Stand to Sit 7: Independent     Ambulation/Gait   Ambulation/Gait Yes   Ambulation/Gait Assistance 7: Independent;4: Min guard  min/guard with balance challenges   Ambulation Distance (Feet) 500 Feet   Assistive device None   Gait Pattern Step-through pattern;Decreased stride length;Decreased arm swing - right;Decreased arm swing - left;Lateral hip instability   Ambulation Surface Level;Indoor   Gait velocity 3.51 ft/sec   Stairs Yes   Stairs Assistance 6: Modified independent (Device/Increase time)   Stair Management Technique One rail Right;Alternating pattern;Forwards   Number of Stairs 4   Height of Stairs 6     Functional Gait  Assessment   Gait assessed  Yes   Gait Level Surface Walks 20 ft in less than 7 sec but greater than 5.5 sec, uses assistive device, slower speed, mild gait deviations, or deviates 6-10 in outside of the 12 in walkway width.   Change in Gait Speed Able to smoothly change walking speed without loss of balance or gait deviation. Deviate no more than 6 in outside of the 12 in walkway width.   Gait with Horizontal Head Turns Performs head turns smoothly with slight change in gait velocity (eg, minor disruption to smooth gait path), deviates 6-10 in outside 12 in walkway width, or uses an assistive device.   Gait with Vertical Head Turns Performs task with slight change in gait velocity (eg, minor disruption to smooth gait path), deviates 6 - 10 in outside 12 in walkway width or uses assistive device   Gait and Pivot Turn Pivot turns safely in greater than 3 sec and stops with no loss of balance, or pivot turns safely within 3 sec and stops with mild imbalance, requires small steps to catch  balance.   Step Over Obstacle Is able to step over one shoe box (4.5 in total height) without changing gait speed. No evidence of imbalance.   Gait with Narrow Base of Support Ambulates 4-7 steps.   Gait with Eyes Closed Walks 20 ft, uses assistive device, slower speed, mild gait deviations, deviates 6-10 in outside 12 in walkway width. Ambulates 20 ft in less than 9 sec but greater than 7 sec.  Ambulating Backwards Walks 20 ft, uses assistive device, slower speed, mild gait deviations, deviates 6-10 in outside 12 in walkway width.   Steps Alternating feet, must use rail.   Total Score 20   FGA comment: 19-24 = medium risk fall                           PT Education - 09/17/16 1307    Education provided Yes   Education Details evaluation findings, POC, goals, initial HEP   Person(s) Educated Patient   Methods Explanation;Demonstration;Handout   Comprehension Verbalized understanding;Returned demonstration          PT Short Term Goals - 09/17/16 1314      PT SHORT TERM GOAL #1   Title Pt will report compliance with initial HEP and demonstrate understanding to indicate improved balance.  (Target for all STGs:  By end of first visit)   Baseline dependent      PT SHORT TERM GOAL #2   Title Pt will perform 8/10 sit<>stand without UE support with equal WB on BLEs at an independent level.    Baseline 5 sit<>stand without UE support with increased L lateral lean due to RLE weakness           PT Long Term Goals - 09/17/16 1541      PT LONG TERM GOAL #1   Title Pt will be independent with final HEP in order to indicate improved functional mobility and balance.  (Target Date for all LTGs:  Following 3rd visit)   Baseline dependent     PT LONG TERM GOAL #2   Title Pt will perform 10/10 sit<>stand without UE support with equal WB in <11 seconds.     Baseline 5 sit<>stand without UE support with increased L lateral lean due to RLE weakness     PT LONG TERM GOAL #3    Title Pt will improve FGA score to > 25/30 in order to indicate decreased fall risk.     Baseline 20/30 on 09/17/16     PT LONG TERM GOAL #4   Title Pt will report return to community and/or leisure activity in order to maintain fitness and balance gains made in therapy.                 Plan - 09/17/16 1309    Clinical Impression Statement Pt presents with history of uncontrolled DM type 1 with diabetic neuropathy (Other specified Diabetis Mellitus with neurological complications with diabetic neuropathy, unspecified E13.40) with resulting strength and balance deficits.  Note that she is approx [redacted] weeks pregnant, mostly sedentary and does not work or go to school at this time due to deficits.  Upon PT evaluation, note that gait speed is WFL at 3.51 ft/sec, however does demonstrate decreased functional strength with 5TSS of 15.72 with intermittent UE support and tendency to use LLE more due to increased weakness in RLE and FGA of 20/30 indicative of medium fall risk.  Initiated HEP for balance today and note increased difficulty with horizontal head turns.  Pt is of evolving presentation and low complexity from PT POC standpoint and will benefit from skilled OP neuro PT to address deficits.     Rehab Potential Good   Clinical Impairments Affecting Rehab Potential compliance with recommendations, visit limitations   PT Frequency Monthy   PT Duration Other (comment)  over 3 months   PT Treatment/Interventions ADLs/Self Care Home Management;DME Instruction;Gait training;Stair training;Functional mobility training;Therapeutic activities;Therapeutic exercise;Balance  training;Neuromuscular re-education;Patient/family education;Energy conservation;Vestibular   PT Next Visit Plan Check compliance with HEP, focus on functional strength, BLE strengthening-give HEP for this as well   Consulted and Agree with Plan of Care Patient      Patient will benefit from skilled therapeutic intervention in  order to improve the following deficits and impairments:  Abnormal gait, Decreased activity tolerance, Decreased balance, Decreased coordination, Decreased endurance, Decreased mobility, Impaired perceived functional ability, Improper body mechanics, Postural dysfunction, Impaired sensation  Visit Diagnosis: Other disturbances of skin sensation - Plan: PT plan of care cert/re-cert  Other abnormalities of gait and mobility - Plan: PT plan of care cert/re-cert  Unsteadiness on feet - Plan: PT plan of care cert/re-cert  Muscle weakness (generalized) - Plan: PT plan of care cert/re-cert     Problem List Patient Active Problem List   Diagnosis Date Noted  . Disorder of skin and subcutaneous tissue 08/02/2014  . Abnormal LFTs 06/09/2014  . Absence of menstruation 06/09/2014  . Anxiety disorder 06/09/2014  . Depression 06/09/2014  . Acid reflux 06/09/2014  . Essential (primary) hypertension 06/09/2014  . Fatigue 06/09/2014  . Alopecia 06/09/2014  . Enlarged liver 06/09/2014  . Microalbuminuria 06/09/2014  . NASH (nonalcoholic steatohepatitis) 06/09/2014  . Awareness of heartbeats 06/09/2014  . Deafness, sensorineural 06/09/2014  . Diabetes mellitus type 1, uncontrolled (HCC) 06/09/2014  . Birth control 04/02/2014  . Paroxysmal supraventricular tachycardia (HCC) 03/05/2014  . Seborrheic dermatitis of scalp 02/20/2014  . Depression, major, recurrent (HCC) 06/09/2013  . Right knee injury 04/21/2013  . Diabetic retinopathy (HCC) 02/20/2013  . Diabetic retinitis (HCC) 02/20/2013  . Overweight (BMI 25.0-29.9) 11/20/2012  . Foot deformity, acquired   . Fatty liver   . Insomnia 01/30/2012  . Diabetic neuropathy (HCC) 01/08/2012  . Hyperlipemia 12/19/2011  . Prurigo nodularis 12/18/2011  . Controlled type 1 diabetes mellitus with mild nonproliferative retinopathy 08/27/1997   Harriet ButteEmily Bryona Foxworthy, PT, MPT [facility] [address] [place], [id number] Phone: [phone]   Fax:  [phone] 09/17/16, 3:55 PM  Name: Christine Dunn MRN: [id number] Date of Birth: 04/21/1991

## 2016-09-17 NOTE — Patient Instructions (Signed)
Feet Together, Head Motion - Eyes Closed    With eyes closed and feet together, move head slowly, up and down x 10 reps, side to side x 10 reps and diagonally both ways x 10 reps.   Repeat _1___ times per session. Do _1-2___ sessions per day.  Copyright  VHI. All rights reserved.   Feet Apart (Compliant Surface) Arm Motion - Eyes Closed    Stand on compliant surface: ___pillow or cushion_____, feet shoulder width apart. Close eyes and keep your arms by your side.  Repeat __3__ times per session x 30 secs each. Do 1-2____ sessions per day.  Copyright  VHI. All rights reserved.   Feet Apart (Compliant Surface) Head Motion - Eyes Open    With eyes open, standing on compliant surface: ________, feet shoulder width apart, move head slowly: up and down x 10 reps, side to side x 10 reps and diagonally both ways x 10 reps.  Repeat _1___ times per session. Do _1-2___ sessions per day.  Copyright  VHI. All rights reserved.     Surface With Up / Down Head Motion    Perform without assistive device. Walking along countertop, move head and eyes toward ceiling for _2___ steps. Then, move head and eyes straight ahead for __2__ steps. Then, move head and eyes toward floor for _2___ steps. Repeat sequence __3__ times per session. Do _1-2___ sessions per day. NO HEEL TO TOE Repeat in dimly lit room.  Copyright  VHI. All rights reserved.   Repeat above exercise with side to side head motion for 2 steps left, back to the middle for 2 steps and to the right for 2 steps.  Repeat x 3 reps.  1-2 times/day. NO HEEL TO TOE  Tandem Walking    Walk with each foot directly in front of other, heel of one foot touching toes of other foot with each step. Both feet straight ahead. Repeat x 3 laps down forwards and backwards.  Use hands on counter if you need to.     Copyright  VHI. All rights reserved.

## 2016-09-18 ENCOUNTER — Ambulatory Visit (INDEPENDENT_AMBULATORY_CARE_PROVIDER_SITE_OTHER): Payer: Medicaid Other | Admitting: Diagnostic Neuroimaging

## 2016-09-18 ENCOUNTER — Encounter: Payer: Self-pay | Admitting: Diagnostic Neuroimaging

## 2016-09-18 VITALS — BP 119/82 | HR 107 | Wt 144.2 lb

## 2016-09-18 DIAGNOSIS — R292 Abnormal reflex: Secondary | ICD-10-CM | POA: Diagnosis not present

## 2016-09-18 DIAGNOSIS — R269 Unspecified abnormalities of gait and mobility: Secondary | ICD-10-CM | POA: Diagnosis not present

## 2016-09-18 DIAGNOSIS — R29898 Other symptoms and signs involving the musculoskeletal system: Secondary | ICD-10-CM

## 2016-09-18 DIAGNOSIS — E1042 Type 1 diabetes mellitus with diabetic polyneuropathy: Secondary | ICD-10-CM

## 2016-09-18 NOTE — Progress Notes (Signed)
[facility]  PATIENT: Christine Dunn DOB: 1991-04-07  REFERRING CLINICIAN: Velazquez HISTORY FROM: patient and mother REASON FOR VISIT: follow up   HISTORICAL  CHIEF COMPLAINT:  Chief Complaint  Patient presents with  . Diabetic polyneuropathy    rm 7, mother- Lynden Ang, pt is [redacted] weeks pregnant, "when my sugar goes up the really bad stinging in my feet goes up"  . Follow-up    3 month    HISTORY OF PRESENT ILLNESS:   UPDATE 09/18/16: Since last visit, symptoms are overall stable. Still with balance, weakness and falling issues. Also [redacted] weeks pregnant currently. No other new issues.  UPDATE 06/13/16: Since last visit, continues with balance and walking issues. Has fallen down 3-4 times since last visit. EMG and LP show no evidence for CIDP. EMG showed generalized axonal neuropathy.   PRIOR HPI (04/05/16): 26 year old right-handed female with history of type 1 diabetes, hypercalcemia, migraine, depression, anxiety, here for evaluation of upper and lower extremity numbness and weakness. Over past 2 months patient has had gradual onset and progressive weakness in her lower extremities. Initially this started in her calves and ankles. Now this is affecting her proximal muscles of lower extremities. One week ago she noticed numbness and weakness in her hands. She is having difficulty carrying a gallon of milk or opening jars. She is having difficulty standing up from a chair and climbing steps. She is having to use her hands to push up and stand up. No prodromal infections, traumas, accidents. Her diabetes has been very difficult to control throughout her life. Hemoglobin A1c is 10.2 but has been in this range for past 1-2 years. Patient notes a "knot" in her right lower back regions. 2 months ago she had 3 tick bites, was tested for Lyme disease which was negative. 3 weeks ago patient tripped and fell and sprained her left ankle.   REVIEW OF SYSTEMS: Full 14 system review of  systems performed and negative with exception of: Memory loss confusion headache dizziness insomnia sleepiness restless legs depression anxiety to much sleep none of sleep disinterest activity joint pain aching muscles increased thirst weight loss fatigue palpitations being sensation.  ALLERGIES: Allergies  Allergen Reactions  . Gabapentin (Once-Daily) Nausea Only    Dizziness   . Penicillins Hives and Swelling  . Statins Other (See Comments)    Elevated liver enzymes.    HOME MEDICATIONS: Outpatient Medications Prior to Visit  Medication Sig Dispense Refill  . insulin aspart (NOVOLOG) 100 UNIT/ML injection Inject 5 Units into the skin 3 (three) times daily before meals. Novolog TID. The goal of CBG is 150. Patient is to take additional 1 unit with every 50 of cbgs above 150. She is to take additional 1 unit for every 3 CHO per patient report( sees endocrinologist Dr.Smith).    . insulin glargine (LANTUS) 100 UNIT/ML injection Inject 50 Units into the skin at bedtime.     . meclizine (ANTIVERT) 12.5 MG tablet Take by mouth.    . neomycin-bacitracin-polymyxin (NEOSPORIN) ointment Apply 1 application topically at bedtime. Apply to affected areas on arms    . ibuprofen (ADVIL,MOTRIN) 800 MG tablet Take 1 tablet (800 mg total) by mouth every 8 (eight) hours as needed. (Patient not taking: Reported on 09/17/2016) 21 tablet 0   No facility-administered medications prior to visit.     PAST MEDICAL HISTORY: Past Medical History:  Diagnosis Date  . Chronic pain of right knee 02/2013   followed by Orthopedist Dr. Eulah Pont  .  Depression 12/25/2011  . Diabetes type 1, uncontrolled (HCC) 08/27/1997  . Diabetic neuropathy (HCC) 01/08/2012  . Fatty liver   . Foot deformity, acquired    little toes, toes nail are falling off, needs surgery, but cant happen till AIC < 9  . Frequent falls    due to weakness of bilateral LE  . Hyperlipidemia LDL goal < 100 12/19/2011  . Insomnia 01/30/2012  . Superficial  skin infection 12/18/2011   Recurrent for past 2 years     PAST SURGICAL HISTORY: Past Surgical History:  Procedure Laterality Date  . WISDOM TOOTH EXTRACTION  2015  . WRIST SURGERY Right 2010   rerouted tendons in wrist    FAMILY HISTORY: Family History  Problem Relation Age of Onset  . Diabetes Maternal Grandfather   . Heart disease Maternal Grandfather   . Lymphoma Maternal Grandfather   . Depression Mother   . Irritable bowel syndrome Mother   . Gestational diabetes Mother   . Hyperlipidemia Father   . Alcohol abuse Brother   . Asthma Brother   . Drug abuse Brother     SOCIAL HISTORY:  Social History   Social History  . Marital status: Single    Spouse name: N/A  . Number of children: 0  . Years of education: 3216   Occupational History  .      na   Social History Main Topics  . Smoking status: Never Smoker  . Smokeless tobacco: Never Used  . Alcohol use No  . Drug use: No  . Sexual activity: Yes    Partners: Male    Birth control/ protection: None   Other Topics Concern  . Not on file   Social History Narrative   Lives with mom   Caffeine- soda daily     PHYSICAL EXAM  GENERAL EXAM/CONSTITUTIONAL: Vitals:  Vitals:   09/18/16 1138  BP: 119/82  Pulse: (!) 107  Weight: 144 lb 3.2 oz (65.4 kg)   Body mass index is 24.95 kg/m. No exam data present  Patient is in no distress; well developed, nourished and groomed; neck is supple  CARDIOVASCULAR:  Examination of carotid arteries is normal; no carotid bruits  Regular rate and rhythm, no murmurs  Examination of peripheral vascular system by observation and palpation is normal  EYES:  Ophthalmoscopic exam of optic discs and posterior segments is normal; no papilledema or hemorrhages  MUSCULOSKELETAL:  Gait, strength, tone, movements noted in Neurologic exam below  NEUROLOGIC: MENTAL STATUS:  No flowsheet data found.  awake, alert, oriented to person, place and time  recent and  remote memory intact  normal attention and concentration  language fluent, comprehension intact, naming intact,   fund of knowledge appropriate  CRANIAL NERVE:   2nd - no papilledema on fundoscopic exam  2nd, 3rd, 4th, 6th - pupils ARE 6MM AND NON-REACTIVE, visual fields full to confrontation, extraocular muscles intact, no nystagmus  5th - facial sensation symmetric  7th - facial strength symmetric  8th - hearing intact  9th - palate elevates symmetrically, uvula midline  11th - shoulder shrug symmetric  12th - tongue protrusion midline  MOTOR:   normal bulk and tone  BUE 5, BLE 5  SENSORY:   normal and symmetric to light touch  DECR PP, TEMP AND VIB IN FEET AND ANKLES  COORDINATION:   finger-nose-finger, fine finger movements normal  REFLEXES:   deep tendon reflexes --> ABSENT IN BUE AND BLE  GAIT/STATION:   narrow based gait; SLIGHTLY CAUTIOUS  GAIT; ABLE TO TANDEM, TOE AND HEEL WALK; romberg is negative    DIAGNOSTIC DATA (LABS, IMAGING, TESTING) - I reviewed patient records, labs, notes, testing and imaging myself where available.  Lab Results  Component Value Date   WBC 10.6 (H) 02/03/2014   HGB 14.9 02/03/2014   HCT 44.0 02/03/2014   MCV 81.2 02/03/2014   PLT 319 02/03/2014      Component Value Date/Time   NA 140 02/03/2014 1305   K 3.9 02/03/2014 1305   CL 98 02/03/2014 1305   CO2 24 02/03/2014 1305   GLUCOSE 95 02/03/2014 1305   BUN 10 02/03/2014 1305   CREATININE 0.58 02/03/2014 1305   CREATININE 0.60 04/24/2012 1525   CALCIUM 10.0 02/03/2014 1305   PROT 8.4 (H) 02/03/2014 1305   ALBUMIN 4.0 02/03/2014 1305   AST 15 02/03/2014 1305   ALT 12 02/03/2014 1305   ALKPHOS 93 02/03/2014 1305   BILITOT 0.5 02/03/2014 1305   GFRNONAA >90 02/03/2014 1305   GFRNONAA >89 04/24/2012 1525   GFRAA >90 02/03/2014 1305   GFRAA >89 04/24/2012 1525   Lab Results  Component Value Date   CHOL 220 (H) 03/22/2014   HDL 62 03/22/2014   LDLCALC  132 (H) 03/22/2014   LDLDIRECT 288 (H) 04/24/2012   TRIG 132 03/22/2014   CHOLHDL 3.5 03/22/2014   Lab Results  Component Value Date   HGBA1C 7.3 01/13/2014   Lab Results  Component Value Date   VITAMINB12 543 04/05/2016   Lab Results  Component Value Date   TSH 1.259 12/18/2011    03/28/13 CXR [I reviewed images myself and agree with interpretation. -VRP]  - No active cardiopulmonary disease.  05/10/16 EMG/NCS - Abnormal study demonstrating mild axonal motor neuropathy affecting lower extremities. Normal F-wave latencies and lack of back pain or radiculopathy symptoms decrease likelihood of concomitant lumbar radiculopathy, but is not excluded on the basis of this test. Correlate clinically.   05/21/16 LUMBAR PUNCTURE - CSF WBC 1, RBC 0, protein 32, glucose 112, OCB negative  Lab Results  Component Value Date   CKTOTAL 97 04/05/2016   CKMB 1.0 05/30/2010   TROPONINI 0.04        NO INDICATION OF MYOCARDIAL INJURY. 05/30/2010     ASSESSMENT AND PLAN  26 y.o. year old female here with progressive ascending weakness and numbness since 2 months. Exam notable for mild generalized weakness, lower extremities more affected than upper extremities, with decreased sensation of the lower extremities and areflexia. Findings consistent with axonal peripheral neuropathy, likely related to diabetes.    Dx: diabetic neuropathy  1. Diabetic polyneuropathy associated with type 1 diabetes mellitus (HCC)   2. Gait difficulty   3. Weakness of both lower extremities   4. Upper extremity weakness   5. Areflexia      PLAN: - continue diabetes control and PT exercises - caution with gait and balance difficulty - may follow up as needed  Return if symptoms worsen or fail to improve, for return to PCP.    Suanne Marker, MD 09/18/2016, 12:00 PM Certified in Neurology, Neurophysiology and Neuroimaging  [facility] [address] [place], [place]  [address] [phone]

## 2016-09-26 ENCOUNTER — Encounter: Payer: Medicaid Other | Attending: Obstetrics and Gynecology | Admitting: *Deleted

## 2016-09-26 DIAGNOSIS — O24011 Pre-existing diabetes mellitus, type 1, in pregnancy, first trimester: Secondary | ICD-10-CM

## 2016-09-26 DIAGNOSIS — Z3A00 Weeks of gestation of pregnancy not specified: Secondary | ICD-10-CM | POA: Diagnosis not present

## 2016-09-26 DIAGNOSIS — E1065 Type 1 diabetes mellitus with hyperglycemia: Secondary | ICD-10-CM

## 2016-09-26 DIAGNOSIS — Z713 Dietary counseling and surveillance: Secondary | ICD-10-CM | POA: Diagnosis not present

## 2016-09-26 DIAGNOSIS — O24919 Unspecified diabetes mellitus in pregnancy, unspecified trimester: Secondary | ICD-10-CM | POA: Diagnosis not present

## 2016-09-26 NOTE — Progress Notes (Signed)
Diabetes Self-Management Education  Visit Type: First/Initial  Appt. Start Time: 1000 Appt. End Time: 1130  09/26/2016  [name], identified by name and date of birth, is a 26 y.o. female with a diagnosis of Diabetes: Type 1. She reports that she is [redacted] weeks pregnant and is followed by endocrinologist, Dr. Janae BridgemanWilliam G [facility], who she states she has been seeing for about 2 years. She also states she was on an insulin pump when she was about 26 years old but came off of it when she developed scar tissue and has not been back on one since. She states she and Dr. Katrinka Dunn are discussing that she may go on a pump during this pregnancy. She reports mild nausea in the mornings until about 11 AM, but that is improving and she is able to eat most meals adequately. He activity level is walking outside and cleaning her house regularly. She is engaged to be married in March, 2018.  ASSESSMENT  Height 5' 3.5" (1.613 m), weight 143 lb 12.8 oz (65.2 kg), last menstrual period 04/30/2016. Body mass index is 25.07 kg/m.      Diabetes Self-Management Education - 09/26/16 1003      Visit Information   Visit Type First/Initial     Initial Visit   Diabetes Type Type 1   Are you currently following a meal plan? No   Are you taking your medications as prescribed? Yes   Date Diagnosed age 89, 2518 years ago     Health Coping   How would you rate your overall health? Good     Psychosocial Assessment   Patient Belief/Attitude about Diabetes Defeat/Burnout   Self-management support Doctor's office;Family;Friends   Other persons present Patient   Patient Concerns Glycemic Control  BG control with pregnancy   Special Needs None   Preferred Learning Style Visual   What is the last grade level you completed in school? certificate from college     Pre-Education Assessment   Patient understands the diabetes disease and treatment process. Demonstrates understanding / competency   Patient  understands incorporating nutritional management into lifestyle. Needs Review   Patient undertands incorporating physical activity into lifestyle. Demonstrates understanding / competency   Patient understands using medications safely. Demonstrates understanding / competency   Patient understands monitoring blood glucose, interpreting and using results Demonstrates understanding / competency   Patient understands prevention, detection, and treatment of acute complications. Demonstrates understanding / competency   Patient understands how to develop strategies to address psychosocial issues. Needs Review     Complications   Last HgB A1C per patient/outside source 10.2 %   How often do you check your blood sugar? 3-4 times/day   Fasting Blood glucose range (mg/dL) 742-595;63-875;<64130-179;70-129;<70   Postprandial Blood glucose range (mg/dL) 332-951;884-166180-200;130-179   Number of hypoglycemic episodes per month 16   Can you tell when your blood sugar is low? Yes   What do you do if your blood sugar is low? drink apple juice   Have you had a dilated eye exam in the past 12 months? Yes   Have you had a dental exam in the past 12 months? Yes   Are you checking your feet? Yes   How many days per week are you checking your feet? 6     Dietary Intake   Breakfast strawberries and a yogurt   Snack (morning) not usually   Lunch hot dog with chips and pickle OR other sandwich OR soup with crackers  Snack (afternoon) only if goes low with BG   Dinner she or her grandmother cooks supper: lean meat, starch, vegetables, occasionally bread   Snack (evening) occasionally cheese stick or piece of cheese   Beverage(s) water, diet soda, 1/2 and 1/2 tea, occasionally apple juice     Exercise   Exercise Type Light (walking / raking leaves)  walks to the mail box, cleans house routinely   How many days per week to you exercise? 2   How many minutes per day do you exercise? 30   Total minutes per week of exercise 60     Patient  Education   Previous Diabetes Education Yes (please comment)  [facility]   Disease state  Explored patient's options for treatment of their diabetes  possible insulin pump   Nutrition management  Carbohydrate counting;Food label reading, portion sizes and measuring food.;Role of diet in the treatment of diabetes and the relationship between the three main macronutrients and blood glucose level   Medications Reviewed patients medication for diabetes, action, purpose, timing of dose and side effects.   Monitoring Purpose and frequency of SMBG.;Identified appropriate SMBG and/or A1C goals.  pregnancy target ranges reviewed   Acute complications Taught treatment of hypoglycemia - the 15 rule.   Psychosocial adjustment Role of stress on diabetes;Identified and addressed patients feelings and concerns about diabetes;Helped patient identify a support system for diabetes management  offered DM 1 / pump support group   Preconception care Reviewed with patient blood glucose goals with pregnancy     Individualized Goals (developed by patient)   Nutrition Other (comment)  discussed carb counting by food group as back up to reading food labels   Medications Other (comment)  insulin to carb ratio is 1 unit/5 grams which also = 3 units / carb choice   Monitoring  test blood glucose pre and post meals as discussed     Outcomes   Expected Outcomes Demonstrated interest in learning. Expect positive outcomes   Future DMSE PRN   Program Status Completed      Individualized Plan for Diabetes Self-Management Training:   Learning Objective:  Patient will have a greater understanding of diabetes self-management. Patient education plan is to attend individual and/or group sessions per assessed needs and concerns.   Plan:   Patient Instructions  Plan:  I have taught you Carb Counting by Food Group as an additional way to determine how much carbohydrate you are eating so you can give the  correct insulin amount.   Your insulin to Carb ratio is 1 unit / 5 grams carb which also = 3 units per Carb Choice  Continue taking your insulin at each meal and provide correction dose of 1 unit / 50 mg/dl above the target of 161 mg/dl as directed by your MD  Continue walking or other forms of activity on a daily basis.  Let me know if you'd like to attend the DM 1 / Pump Support Group?   Expected Outcomes:  Demonstrated interest in learning. Expect positive outcomes  Education material provided: Meal plan card and Carbohydrate counting sheet, Food Label handout, Pregnancy Guidelines handout  If problems or questions, patient to contact team via:  Phone and Email  Future DSME appointment: PRN

## 2016-09-26 NOTE — Patient Instructions (Signed)
Plan:  I have taught you Carb Counting by Food Group as an additional way to determine how much carbohydrate you are eating so you can give the correct insulin amount.   Your insulin to Carb ratio is 1 unit / 5 grams carb which also = 3 units per Carb Choice  Continue taking your insulin at each meal and provide correction dose of 1 unit / 50 mg/dl above the target of 161150 mg/dl as directed by your MD  Continue walking or other forms of activity on a daily basis.  Let me know if you'd like to attend the DM 1 / Pump Support Group?

## 2016-09-27 ENCOUNTER — Encounter (HOSPITAL_COMMUNITY): Payer: Self-pay

## 2016-09-27 ENCOUNTER — Other Ambulatory Visit: Payer: Self-pay

## 2016-10-01 ENCOUNTER — Ambulatory Visit: Payer: Medicaid Other | Attending: Diagnostic Neuroimaging | Admitting: Rehabilitation

## 2016-10-26 ENCOUNTER — Other Ambulatory Visit (HOSPITAL_COMMUNITY): Payer: Self-pay | Admitting: Psychiatry

## 2016-10-29 ENCOUNTER — Ambulatory Visit: Payer: Medicaid Other | Attending: Diagnostic Neuroimaging | Admitting: Rehabilitation

## 2016-11-01 ENCOUNTER — Encounter: Payer: Self-pay | Admitting: Rehabilitation

## 2016-11-01 NOTE — Therapy (Signed)
[facility] [address] [place], [place], [address] Phone: [phone]   Fax:  [phone]  Patient Details  Name: Christine Dunn MRN: [id number] Date of Birth: October 04, 1990 Referring Provider:  No ref. provider found  Encounter Date: 11/01/2016   PHYSICAL THERAPY DISCHARGE SUMMARY  Visits from Start of Care: 1  Current functional level related to goals / functional outcomes: Unsure as she did not return for follow up visits    Remaining deficits:     PT Long Term Goals - 09/17/16 1541      PT LONG TERM GOAL #1   Title Pt will be independent with final HEP in order to indicate improved functional mobility and balance.  (Target Date for all LTGs:  Following 3rd visit)   Baseline dependent     PT LONG TERM GOAL #2   Title Pt will perform 10/10 sit<>stand without UE support with equal WB in <11 seconds.     Baseline 5 sit<>stand without UE support with increased L lateral lean due to RLE weakness     PT LONG TERM GOAL #3   Title Pt will improve FGA score to > 25/30 in order to indicate decreased fall risk.     Baseline 20/30 on 09/17/16     PT LONG TERM GOAL #4   Title Pt will report return to community and/or leisure activity in order to maintain fitness and balance gains made in therapy.          Education / Equipment: Initial HEP for balance  Plan: Patient agrees to discharge.  Patient goals were not met. Patient is being discharged due to not returning since the last visit.  ?????          Cameron Sprang, PT, MPT [facility] [address] [place], [place], [address] Phone: [phone]   Fax:  [phone] 11/01/16, 10:53 AM

## 2016-11-08 ENCOUNTER — Other Ambulatory Visit (HOSPITAL_COMMUNITY): Payer: Self-pay | Admitting: Psychiatry

## 2016-11-13 ENCOUNTER — Encounter (HOSPITAL_COMMUNITY): Payer: Self-pay

## 2016-11-15 ENCOUNTER — Other Ambulatory Visit (HOSPITAL_COMMUNITY): Payer: Self-pay | Admitting: Obstetrics and Gynecology

## 2016-11-15 ENCOUNTER — Other Ambulatory Visit (HOSPITAL_COMMUNITY): Payer: Self-pay | Admitting: Psychiatry

## 2016-11-15 DIAGNOSIS — Z3689 Encounter for other specified antenatal screening: Secondary | ICD-10-CM

## 2016-11-15 DIAGNOSIS — O24012 Pre-existing diabetes mellitus, type 1, in pregnancy, second trimester: Secondary | ICD-10-CM

## 2016-11-15 DIAGNOSIS — Z3A21 21 weeks gestation of pregnancy: Secondary | ICD-10-CM

## 2016-11-15 NOTE — Telephone Encounter (Signed)
It was discontinued due to patient being pregnant

## 2016-11-26 ENCOUNTER — Ambulatory Visit: Payer: Self-pay | Admitting: Rehabilitation

## 2016-11-28 ENCOUNTER — Ambulatory Visit (HOSPITAL_COMMUNITY)
Admission: RE | Admit: 2016-11-28 | Discharge: 2016-11-28 | Disposition: A | Discharge status: Home / Self Care | Payer: Medicaid Other | Source: Ambulatory Visit | Attending: Obstetrics and Gynecology | Admitting: Obstetrics and Gynecology

## 2016-11-28 ENCOUNTER — Encounter (HOSPITAL_COMMUNITY): Payer: Self-pay | Admitting: *Deleted

## 2016-11-28 DIAGNOSIS — Z3A21 21 weeks gestation of pregnancy: Secondary | ICD-10-CM | POA: Diagnosis not present

## 2016-11-28 DIAGNOSIS — O24012 Pre-existing diabetes mellitus, type 1, in pregnancy, second trimester: Secondary | ICD-10-CM | POA: Diagnosis not present

## 2016-11-28 DIAGNOSIS — Z3689 Encounter for other specified antenatal screening: Secondary | ICD-10-CM | POA: Diagnosis present

## 2016-11-30 ENCOUNTER — Other Ambulatory Visit: Payer: Self-pay

## 2016-12-06 ENCOUNTER — Ambulatory Visit (HOSPITAL_COMMUNITY): Payer: Medicaid Other | Admitting: Psychiatry

## 2017-01-23 ENCOUNTER — Encounter (INDEPENDENT_AMBULATORY_CARE_PROVIDER_SITE_OTHER): Payer: Medicaid Other | Admitting: Podiatry

## 2017-01-23 NOTE — Progress Notes (Signed)
This encounter was created in error - please disregard.

## 2017-03-11 ENCOUNTER — Telehealth (HOSPITAL_COMMUNITY): Payer: Self-pay | Admitting: *Deleted

## 2017-03-11 ENCOUNTER — Encounter (HOSPITAL_COMMUNITY): Payer: Self-pay | Admitting: *Deleted

## 2017-03-11 NOTE — Telephone Encounter (Signed)
Preadmission screen  

## 2017-03-15 ENCOUNTER — Other Ambulatory Visit (HOSPITAL_COMMUNITY): Payer: Self-pay | Admitting: Obstetrics & Gynecology

## 2017-03-15 DIAGNOSIS — O283 Abnormal ultrasonic finding on antenatal screening of mother: Secondary | ICD-10-CM

## 2017-03-15 DIAGNOSIS — Z3A36 36 weeks gestation of pregnancy: Secondary | ICD-10-CM

## 2017-03-19 ENCOUNTER — Ambulatory Visit (HOSPITAL_COMMUNITY)
Admission: RE | Admit: 2017-03-19 | Discharge: 2017-03-19 | Disposition: A | Discharge status: Home / Self Care | Payer: Medicaid Other | Source: Ambulatory Visit | Attending: Obstetrics & Gynecology | Admitting: Obstetrics & Gynecology

## 2017-03-19 ENCOUNTER — Other Ambulatory Visit (HOSPITAL_COMMUNITY): Payer: Self-pay | Admitting: Obstetrics & Gynecology

## 2017-03-19 ENCOUNTER — Encounter (HOSPITAL_COMMUNITY): Payer: Self-pay

## 2017-03-19 DIAGNOSIS — O358XX0 Maternal care for other (suspected) fetal abnormality and damage, not applicable or unspecified: Secondary | ICD-10-CM | POA: Insufficient documentation

## 2017-03-19 DIAGNOSIS — Z362 Encounter for other antenatal screening follow-up: Secondary | ICD-10-CM

## 2017-03-19 DIAGNOSIS — O283 Abnormal ultrasonic finding on antenatal screening of mother: Secondary | ICD-10-CM

## 2017-03-19 DIAGNOSIS — Z3A37 37 weeks gestation of pregnancy: Secondary | ICD-10-CM

## 2017-03-19 DIAGNOSIS — O359XX0 Maternal care for (suspected) fetal abnormality and damage, unspecified, not applicable or unspecified: Secondary | ICD-10-CM

## 2017-03-19 DIAGNOSIS — O24013 Pre-existing diabetes mellitus, type 1, in pregnancy, third trimester: Secondary | ICD-10-CM | POA: Insufficient documentation

## 2017-03-19 DIAGNOSIS — Z3A36 36 weeks gestation of pregnancy: Secondary | ICD-10-CM

## 2017-03-20 ENCOUNTER — Inpatient Hospital Stay (HOSPITAL_COMMUNITY): Payer: Medicaid Other | Admitting: Anesthesiology

## 2017-03-20 ENCOUNTER — Inpatient Hospital Stay (HOSPITAL_COMMUNITY)
Admission: RE | Admit: 2017-03-20 | Discharge: 2017-03-23 | DRG: 765 | Disposition: A | Discharge status: Home / Self Care | Payer: Medicaid Other | Source: Ambulatory Visit | Attending: Obstetrics and Gynecology | Admitting: Obstetrics and Gynecology

## 2017-03-20 ENCOUNTER — Encounter (HOSPITAL_COMMUNITY): Payer: Self-pay

## 2017-03-20 ENCOUNTER — Encounter (HOSPITAL_COMMUNITY)
Admission: RE | Disposition: A | Discharge status: Home / Self Care | Payer: Self-pay | Source: Ambulatory Visit | Attending: Obstetrics and Gynecology

## 2017-03-20 DIAGNOSIS — O9832 Other infections with a predominantly sexual mode of transmission complicating childbirth: Secondary | ICD-10-CM | POA: Diagnosis present

## 2017-03-20 DIAGNOSIS — O2402 Pre-existing diabetes mellitus, type 1, in childbirth: Principal | ICD-10-CM | POA: Diagnosis present

## 2017-03-20 DIAGNOSIS — O99824 Streptococcus B carrier state complicating childbirth: Secondary | ICD-10-CM | POA: Diagnosis present

## 2017-03-20 DIAGNOSIS — O24019 Pre-existing diabetes mellitus, type 1, in pregnancy, unspecified trimester: Secondary | ICD-10-CM

## 2017-03-20 DIAGNOSIS — O134 Gestational [pregnancy-induced] hypertension without significant proteinuria, complicating childbirth: Secondary | ICD-10-CM | POA: Diagnosis present

## 2017-03-20 DIAGNOSIS — E109 Type 1 diabetes mellitus without complications: Secondary | ICD-10-CM | POA: Diagnosis present

## 2017-03-20 DIAGNOSIS — Z794 Long term (current) use of insulin: Secondary | ICD-10-CM

## 2017-03-20 DIAGNOSIS — O139 Gestational [pregnancy-induced] hypertension without significant proteinuria, unspecified trimester: Secondary | ICD-10-CM | POA: Diagnosis present

## 2017-03-20 DIAGNOSIS — A6000 Herpesviral infection of urogenital system, unspecified: Secondary | ICD-10-CM | POA: Diagnosis present

## 2017-03-20 DIAGNOSIS — Z3A37 37 weeks gestation of pregnancy: Secondary | ICD-10-CM | POA: Diagnosis not present

## 2017-03-20 LAB — COMPREHENSIVE METABOLIC PANEL
ALK PHOS: 167 U/L — AB (ref 38–126)
ALT: 14 U/L (ref 14–54)
ANION GAP: 10 (ref 5–15)
AST: 26 U/L (ref 15–41)
Albumin: 2.5 g/dL — ABNORMAL LOW (ref 3.5–5.0)
BILIRUBIN TOTAL: 0.8 mg/dL (ref 0.3–1.2)
BUN: 5 mg/dL — ABNORMAL LOW (ref 6–20)
CALCIUM: 8.9 mg/dL (ref 8.9–10.3)
CO2: 21 mmol/L — ABNORMAL LOW (ref 22–32)
Chloride: 103 mmol/L (ref 101–111)
Creatinine, Ser: 0.5 mg/dL (ref 0.44–1.00)
GLUCOSE: 74 mg/dL (ref 65–99)
POTASSIUM: 3.6 mmol/L (ref 3.5–5.1)
Sodium: 134 mmol/L — ABNORMAL LOW (ref 135–145)
TOTAL PROTEIN: 6.4 g/dL — AB (ref 6.5–8.1)

## 2017-03-20 LAB — TYPE AND SCREEN
ABO/RH(D): A POS
ANTIBODY SCREEN: NEGATIVE

## 2017-03-20 LAB — GLUCOSE, CAPILLARY
GLUCOSE-CAPILLARY: 158 mg/dL — AB (ref 65–99)
Glucose-Capillary: 72 mg/dL (ref 65–99)
Glucose-Capillary: 50 mg/dL — ABNORMAL LOW (ref 65–99)
Glucose-Capillary: 66 mg/dL (ref 65–99)
Glucose-Capillary: 108 mg/dL — ABNORMAL HIGH (ref 65–99)
Glucose-Capillary: 125 mg/dL — ABNORMAL HIGH (ref 65–99)
Glucose-Capillary: 178 mg/dL — ABNORMAL HIGH (ref 65–99)

## 2017-03-20 LAB — CBC
HCT: 36 % (ref 36.0–46.0)
HEMATOCRIT: 36.3 % (ref 36.0–46.0)
HEMATOCRIT: 32.4 % — AB (ref 36.0–46.0)
HEMOGLOBIN: 10.5 g/dL — AB (ref 12.0–15.0)
Hemoglobin: 12.1 g/dL (ref 12.0–15.0)
Hemoglobin: 11.9 g/dL — ABNORMAL LOW (ref 12.0–15.0)
MCH: 26.4 pg (ref 26.0–34.0)
MCH: 26.3 pg (ref 26.0–34.0)
MCH: 26.3 pg (ref 26.0–34.0)
MCHC: 33.3 g/dL (ref 30.0–36.0)
MCHC: 33.1 g/dL (ref 30.0–36.0)
MCHC: 32.4 g/dL (ref 30.0–36.0)
MCV: 79.1 fL (ref 78.0–100.0)
MCV: 79.6 fL (ref 78.0–100.0)
MCV: 81 fL (ref 78.0–100.0)
PLATELETS: 207 10*3/uL (ref 150–400)
PLATELETS: 199 10*3/uL (ref 150–400)
Platelets: 183 10*3/uL (ref 150–400)
RBC: 4.59 MIL/uL (ref 3.87–5.11)
RBC: 4.52 MIL/uL (ref 3.87–5.11)
RBC: 4 MIL/uL (ref 3.87–5.11)
RDW: 14.3 % (ref 11.5–15.5)
RDW: 14.5 % (ref 11.5–15.5)
RDW: 14.5 % (ref 11.5–15.5)
WBC: 9.1 10*3/uL (ref 4.0–10.5)
WBC: 8.7 10*3/uL (ref 4.0–10.5)
WBC: 17.2 10*3/uL — ABNORMAL HIGH (ref 4.0–10.5)

## 2017-03-20 LAB — RPR: RPR Ser Ql: NONREACTIVE

## 2017-03-20 LAB — URIC ACID: Uric Acid, Serum: 4.8 mg/dL (ref 2.3–6.6)

## 2017-03-20 LAB — ABO/RH: ABO/RH(D): A POS

## 2017-03-20 SURGERY — Surgical Case
Anesthesia: Epidural

## 2017-03-20 MED ORDER — OXYTOCIN 10 UNIT/ML IJ SOLN
INTRAMUSCULAR | Status: AC
  Filled 2017-03-20: qty 3

## 2017-03-20 MED ORDER — OXYCODONE-ACETAMINOPHEN 5-325 MG PO TABS
2.0000 | ORAL_TABLET | ORAL | Status: DC | PRN
Start: 2017-03-20 — End: 2017-03-20

## 2017-03-20 MED ORDER — FENTANYL 2.5 MCG/ML BUPIVACAINE 1/10 % EPIDURAL INFUSION (WH - ANES)
14.0000 mL/h | INTRAMUSCULAR | Status: DC | PRN
  Administered 2017-03-20 (×2): 14 mL/h via EPIDURAL
  Filled 2017-03-20 (×2): qty 100

## 2017-03-20 MED ORDER — ZOLPIDEM TARTRATE 5 MG PO TABS: 5.0000 mg | ORAL_TABLET | Freq: Every evening | ORAL | Status: DC | PRN

## 2017-03-20 MED ORDER — LIDOCAINE HCL (PF) 1 % IJ SOLN
INTRAMUSCULAR | Status: DC | PRN
  Administered 2017-03-20: 4 mL via EPIDURAL

## 2017-03-20 MED ORDER — ONDANSETRON HCL 4 MG/2ML IJ SOLN: 4.0000 mg | Freq: Four times a day (QID) | INTRAMUSCULAR | Status: DC | PRN

## 2017-03-20 MED ORDER — OXYTOCIN BOLUS FROM INFUSION: 500.0000 mL | Freq: Once | INTRAVENOUS | Status: DC

## 2017-03-20 MED ORDER — ONDANSETRON HCL 4 MG/2ML IJ SOLN
INTRAMUSCULAR | Status: AC
  Filled 2017-03-20: qty 2

## 2017-03-20 MED ORDER — OXYTOCIN 40 UNITS IN LACTATED RINGERS INFUSION - SIMPLE MED: 2.5000 [IU]/h | INTRAVENOUS | Status: DC

## 2017-03-20 MED ORDER — DEXAMETHASONE SODIUM PHOSPHATE 10 MG/ML IJ SOLN
INTRAMUSCULAR | Status: AC
  Filled 2017-03-20: qty 1

## 2017-03-20 MED ORDER — GENTAMICIN SULFATE 40 MG/ML IJ SOLN
5.0000 mg/kg | Freq: Once | INTRAVENOUS | Status: AC
  Administered 2017-03-20: 320 mg via INTRAVENOUS
  Filled 2017-03-20: qty 8

## 2017-03-20 MED ORDER — LACTATED RINGERS IV SOLN: 500.0000 mL | INTRAVENOUS | Status: DC | PRN

## 2017-03-20 MED ORDER — OXYCODONE-ACETAMINOPHEN 5-325 MG PO TABS: 1.0000 | ORAL_TABLET | ORAL | Status: DC | PRN

## 2017-03-20 MED ORDER — BUTORPHANOL TARTRATE 1 MG/ML IJ SOLN: 1.0000 mg | INTRAMUSCULAR | Status: DC | PRN

## 2017-03-20 MED ORDER — LIDOCAINE HCL (PF) 1 % IJ SOLN
30.0000 mL | INTRAMUSCULAR | Status: DC | PRN
  Filled 2017-03-20: qty 30

## 2017-03-20 MED ORDER — TETANUS-DIPHTH-ACELL PERTUSSIS 5-2.5-18.5 LF-MCG/0.5 IM SUSP: 0.5000 mL | Freq: Once | INTRAMUSCULAR | Status: DC

## 2017-03-20 MED ORDER — SENNOSIDES-DOCUSATE SODIUM 8.6-50 MG PO TABS
2.0000 | ORAL_TABLET | ORAL | Status: DC
  Administered 2017-03-21 – 2017-03-22 (×3): 2 via ORAL
  Filled 2017-03-20 (×3): qty 2

## 2017-03-20 MED ORDER — DEXTROSE IN LACTATED RINGERS 5 % IV SOLN: INTRAVENOUS | Status: DC

## 2017-03-20 MED ORDER — TERBUTALINE SULFATE 1 MG/ML IJ SOLN: 0.2500 mg | Freq: Once | INTRAMUSCULAR | Status: DC | PRN

## 2017-03-20 MED ORDER — OXYCODONE HCL 5 MG PO TABS
10.0000 mg | ORAL_TABLET | ORAL | Status: DC | PRN
  Administered 2017-03-22 (×2): 10 mg via ORAL
  Filled 2017-03-20 (×2): qty 2

## 2017-03-20 MED ORDER — OXYCODONE HCL 5 MG PO TABS
5.0000 mg | ORAL_TABLET | ORAL | Status: DC | PRN
  Administered 2017-03-21: 5 mg via ORAL
  Filled 2017-03-20: qty 1

## 2017-03-20 MED ORDER — FLEET ENEMA 7-19 GM/118ML RE ENEM: 1.0000 | ENEMA | RECTAL | Status: DC | PRN

## 2017-03-20 MED ORDER — DIPHENHYDRAMINE HCL 50 MG/ML IJ SOLN: 12.5000 mg | INTRAMUSCULAR | Status: DC | PRN

## 2017-03-20 MED ORDER — LACTATED RINGERS IV SOLN: INTRAVENOUS | Status: DC

## 2017-03-20 MED ORDER — OXYTOCIN 40 UNITS IN LACTATED RINGERS INFUSION - SIMPLE MED
1.0000 m[IU]/min | INTRAVENOUS | Status: DC
  Administered 2017-03-20: 1 m[IU]/min via INTRAVENOUS
  Filled 2017-03-20: qty 1000

## 2017-03-20 MED ORDER — OXYTOCIN 10 UNIT/ML IJ SOLN
INTRAMUSCULAR | Status: AC
  Filled 2017-03-20: qty 4

## 2017-03-20 MED ORDER — CLINDAMYCIN PHOSPHATE 900 MG/50ML IV SOLN
900.0000 mg | Freq: Three times a day (TID) | INTRAVENOUS | Status: DC
  Administered 2017-03-20 (×2): 900 mg via INTRAVENOUS
  Filled 2017-03-20 (×4): qty 50

## 2017-03-20 MED ORDER — MENTHOL 3 MG MT LOZG: 1.0000 | LOZENGE | OROMUCOSAL | Status: DC | PRN

## 2017-03-20 MED ORDER — WITCH HAZEL-GLYCERIN EX PADS: 1.0000 "application " | MEDICATED_PAD | CUTANEOUS | Status: DC | PRN

## 2017-03-20 MED ORDER — PHENYLEPHRINE 40 MCG/ML (10ML) SYRINGE FOR IV PUSH (FOR BLOOD PRESSURE SUPPORT)
80.0000 ug | PREFILLED_SYRINGE | INTRAVENOUS | Status: DC | PRN
  Filled 2017-03-20: qty 10

## 2017-03-20 MED ORDER — PROMETHAZINE HCL 25 MG/ML IJ SOLN: 6.2500 mg | INTRAMUSCULAR | Status: DC | PRN

## 2017-03-20 MED ORDER — LACTATED RINGERS IV SOLN
INTRAVENOUS | Status: DC
  Administered 2017-03-20 (×4): via INTRAVENOUS

## 2017-03-20 MED ORDER — PRENATAL MULTIVITAMIN CH
1.0000 | ORAL_TABLET | Freq: Every day | ORAL | Status: DC
  Administered 2017-03-21 – 2017-03-23 (×3): 1 via ORAL
  Filled 2017-03-20 (×3): qty 1

## 2017-03-20 MED ORDER — SCOPOLAMINE 1 MG/3DAYS TD PT72
MEDICATED_PATCH | TRANSDERMAL | Status: DC | PRN
  Administered 2017-03-20: 1 via TRANSDERMAL

## 2017-03-20 MED ORDER — OXYTOCIN 40 UNITS IN LACTATED RINGERS INFUSION - SIMPLE MED: 2.5000 [IU]/h | INTRAVENOUS | Status: AC

## 2017-03-20 MED ORDER — MISOPROSTOL 25 MCG QUARTER TABLET: 25.0000 ug | ORAL_TABLET | ORAL | Status: DC | PRN

## 2017-03-20 MED ORDER — ACETAMINOPHEN 325 MG PO TABS: 650.0000 mg | ORAL_TABLET | ORAL | Status: DC | PRN

## 2017-03-20 MED ORDER — INSULIN ASPART 100 UNIT/ML ~~LOC~~ SOLN
0.0000 [IU] | SUBCUTANEOUS | Status: DC
  Administered 2017-03-20: 2 [IU] via SUBCUTANEOUS
  Administered 2017-03-21: 1 [IU] via SUBCUTANEOUS
  Administered 2017-03-21: 3 [IU] via SUBCUTANEOUS
  Administered 2017-03-21: 2 [IU] via SUBCUTANEOUS

## 2017-03-20 MED ORDER — SIMETHICONE 80 MG PO CHEW: 80.0000 mg | CHEWABLE_TABLET | ORAL | Status: DC | PRN

## 2017-03-20 MED ORDER — EPHEDRINE 5 MG/ML INJ: 10.0000 mg | INTRAVENOUS | Status: DC | PRN

## 2017-03-20 MED ORDER — IBUPROFEN 600 MG PO TABS
600.0000 mg | ORAL_TABLET | Freq: Four times a day (QID) | ORAL | Status: DC
  Administered 2017-03-21 – 2017-03-23 (×11): 600 mg via ORAL
  Filled 2017-03-20 (×11): qty 1

## 2017-03-20 MED ORDER — LACTATED RINGERS IV SOLN
500.0000 mL | Freq: Once | INTRAVENOUS | Status: AC
  Administered 2017-03-20: 500 mL via INTRAVENOUS

## 2017-03-20 MED ORDER — OXYTOCIN 10 UNIT/ML IJ SOLN
INTRAVENOUS | Status: DC | PRN
  Administered 2017-03-20: 40 [IU] via INTRAVENOUS

## 2017-03-20 MED ORDER — DIBUCAINE 1 % RE OINT: 1.0000 "application " | TOPICAL_OINTMENT | RECTAL | Status: DC | PRN

## 2017-03-20 MED ORDER — SODIUM BICARBONATE 8.4 % IV SOLN
INTRAVENOUS | Status: DC | PRN
  Administered 2017-03-20: 5 mL via EPIDURAL
  Administered 2017-03-20: 10 mL via EPIDURAL
  Administered 2017-03-20: 5 mL via EPIDURAL

## 2017-03-20 MED ORDER — MEPERIDINE HCL 25 MG/ML IJ SOLN: 6.2500 mg | INTRAMUSCULAR | Status: DC | PRN

## 2017-03-20 MED ORDER — PHENYLEPHRINE 40 MCG/ML (10ML) SYRINGE FOR IV PUSH (FOR BLOOD PRESSURE SUPPORT): 80.0000 ug | PREFILLED_SYRINGE | INTRAVENOUS | Status: DC | PRN

## 2017-03-20 MED ORDER — MORPHINE SULFATE (PF) 0.5 MG/ML IJ SOLN
INTRAMUSCULAR | Status: DC | PRN
  Administered 2017-03-20: 4 mg via EPIDURAL
  Administered 2017-03-20: 1 mg via INTRAVENOUS

## 2017-03-20 MED ORDER — SIMETHICONE 80 MG PO CHEW
80.0000 mg | CHEWABLE_TABLET | Freq: Three times a day (TID) | ORAL | Status: DC
  Administered 2017-03-21 – 2017-03-23 (×7): 80 mg via ORAL
  Filled 2017-03-20 (×6): qty 1

## 2017-03-20 MED ORDER — MISOPROSTOL 25 MCG QUARTER TABLET
25.0000 ug | ORAL_TABLET | ORAL | Status: DC | PRN
  Administered 2017-03-20 (×2): 25 ug via VAGINAL
  Filled 2017-03-20 (×2): qty 1

## 2017-03-20 MED ORDER — OXYCODONE-ACETAMINOPHEN 5-325 MG PO TABS: 2.0000 | ORAL_TABLET | ORAL | Status: DC | PRN

## 2017-03-20 MED ORDER — SERTRALINE HCL 100 MG PO TABS: 100.0000 mg | ORAL_TABLET | Freq: Every day | ORAL | Status: DC

## 2017-03-20 MED ORDER — COCONUT OIL OIL: 1.0000 "application " | TOPICAL_OIL | Status: DC | PRN

## 2017-03-20 MED ORDER — SOD CITRATE-CITRIC ACID 500-334 MG/5ML PO SOLN
30.0000 mL | ORAL | Status: DC | PRN
  Administered 2017-03-20: 30 mL via ORAL
  Filled 2017-03-20 (×2): qty 15

## 2017-03-20 MED ORDER — MORPHINE SULFATE (PF) 0.5 MG/ML IJ SOLN
INTRAMUSCULAR | Status: AC
  Filled 2017-03-20: qty 10

## 2017-03-20 MED ORDER — SODIUM CHLORIDE 0.9 % IJ SOLN
INTRAMUSCULAR | Status: AC
  Filled 2017-03-20: qty 20

## 2017-03-20 MED ORDER — SIMETHICONE 80 MG PO CHEW
80.0000 mg | CHEWABLE_TABLET | ORAL | Status: DC
  Administered 2017-03-21 – 2017-03-22 (×3): 80 mg via ORAL
  Filled 2017-03-20 (×3): qty 1

## 2017-03-20 MED ORDER — ONDANSETRON HCL 4 MG/2ML IJ SOLN
INTRAMUSCULAR | Status: DC | PRN
  Administered 2017-03-20: 4 mg via INTRAVENOUS

## 2017-03-20 MED ORDER — SCOPOLAMINE 1 MG/3DAYS TD PT72
MEDICATED_PATCH | TRANSDERMAL | Status: AC
  Filled 2017-03-20: qty 1

## 2017-03-20 MED ORDER — LACTATED RINGERS IV SOLN
INTRAVENOUS | Status: DC | PRN
  Administered 2017-03-20: 20:00:00 via INTRAVENOUS

## 2017-03-20 MED ORDER — ACETAMINOPHEN 325 MG PO TABS
650.0000 mg | ORAL_TABLET | ORAL | Status: DC | PRN
  Administered 2017-03-21: 650 mg via ORAL
  Filled 2017-03-20: qty 2

## 2017-03-20 MED ORDER — SOD CITRATE-CITRIC ACID 500-334 MG/5ML PO SOLN
30.0000 mL | ORAL | Status: DC | PRN
  Filled 2017-03-20: qty 30

## 2017-03-20 MED ORDER — DEXTROSE IN LACTATED RINGERS 5 % IV SOLN
INTRAVENOUS | Status: DC
  Administered 2017-03-20: 07:00:00 via INTRAVENOUS

## 2017-03-20 MED ORDER — SODIUM CHLORIDE 0.9 % IV SOLN
INTRAVENOUS | Status: DC
  Administered 2017-03-20: 1 [IU]/h via INTRAVENOUS
  Filled 2017-03-20: qty 1

## 2017-03-20 MED ORDER — LIDOCAINE HCL (PF) 1 % IJ SOLN: 30.0000 mL | INTRAMUSCULAR | Status: DC | PRN

## 2017-03-20 MED ORDER — CLINDAMYCIN PHOSPHATE 900 MG/50ML IV SOLN: 900.0000 mg | Freq: Three times a day (TID) | INTRAVENOUS | Status: DC

## 2017-03-20 MED ORDER — HYDROMORPHONE HCL 1 MG/ML IJ SOLN: 0.2500 mg | INTRAMUSCULAR | Status: DC | PRN

## 2017-03-20 SURGICAL SUPPLY — 40 items
ADH SKN CLS APL DERMABOND .7 (GAUZE/BANDAGES/DRESSINGS)
APL SKNCLS STERI-STRIP NONHPOA (GAUZE/BANDAGES/DRESSINGS) ×1
BENZOIN TINCTURE PRP APPL 2/3 (GAUZE/BANDAGES/DRESSINGS) ×2 IMPLANT
CHLORAPREP W/TINT 26ML (MISCELLANEOUS) ×3 IMPLANT
CLAMP CORD UMBIL (MISCELLANEOUS) IMPLANT
CLOSURE STERI STRIP 1/2 X4 (GAUZE/BANDAGES/DRESSINGS) ×2 IMPLANT
CLOTH BEACON ORANGE TIMEOUT ST (SAFETY) ×3 IMPLANT
CONTAINER PREFILL 10% NBF 15ML (MISCELLANEOUS) IMPLANT
DERMABOND ADVANCED (GAUZE/BANDAGES/DRESSINGS)
DERMABOND ADVANCED .7 DNX12 (GAUZE/BANDAGES/DRESSINGS) IMPLANT
DRSG OPSITE POSTOP 4X10 (GAUZE/BANDAGES/DRESSINGS) ×3 IMPLANT
ELECT REM PT RETURN 9FT ADLT (ELECTROSURGICAL) ×3
ELECTRODE REM PT RTRN 9FT ADLT (ELECTROSURGICAL) ×1 IMPLANT
EXTRACTOR VACUUM M CUP 4 TUBE (SUCTIONS) IMPLANT
EXTRACTOR VACUUM M CUP 4' TUBE (SUCTIONS)
GAUZE SPONGE 4X4 12PLY STRL LF (GAUZE/BANDAGES/DRESSINGS) ×2 IMPLANT
GLOVE BIO SURGEON STRL SZ8 (GLOVE) ×3 IMPLANT
GLOVE BIOGEL PI IND STRL 7.0 (GLOVE) ×1 IMPLANT
GLOVE BIOGEL PI INDICATOR 7.0 (GLOVE) ×2
GOWN STRL REUS W/TWL LRG LVL3 (GOWN DISPOSABLE) ×6 IMPLANT
KIT ABG SYR 3ML LUER SLIP (SYRINGE) ×3 IMPLANT
NDL HYPO 25X5/8 SAFETYGLIDE (NEEDLE) ×1 IMPLANT
NEEDLE HYPO 25X5/8 SAFETYGLIDE (NEEDLE) ×3 IMPLANT
NS IRRIG 1000ML POUR BTL (IV SOLUTION) ×3 IMPLANT
PACK C SECTION WH (CUSTOM PROCEDURE TRAY) ×3 IMPLANT
PAD ABD 7.5X8 STRL (GAUZE/BANDAGES/DRESSINGS) ×2 IMPLANT
PAD OB MATERNITY 4.3X12.25 (PERSONAL CARE ITEMS) ×3 IMPLANT
PENCIL SMOKE EVAC W/HOLSTER (ELECTROSURGICAL) ×3 IMPLANT
SPONGE LAP 18X18 X RAY DECT (DISPOSABLE) ×2 IMPLANT
STRIP CLOSURE SKIN 1/2X4 (GAUZE/BANDAGES/DRESSINGS) ×1 IMPLANT
SUT MNCRL 0 VIOLET CTX 36 (SUTURE) ×4 IMPLANT
SUT MONOCRYL 0 CTX 36 (SUTURE) ×8
SUT PDS AB 0 CTX 60 (SUTURE) ×3 IMPLANT
SUT PLAIN 0 NONE (SUTURE) IMPLANT
SUT PLAIN 2 0 (SUTURE)
SUT PLAIN 2 0 XLH (SUTURE) IMPLANT
SUT PLAIN ABS 2-0 CT1 27XMFL (SUTURE) IMPLANT
SUT VIC AB 4-0 KS 27 (SUTURE) ×3 IMPLANT
TOWEL OR 17X24 6PK STRL BLUE (TOWEL DISPOSABLE) ×3 IMPLANT
TRAY FOLEY BAG SILVER LF 14FR (SET/KITS/TRAYS/PACK) ×3 IMPLANT

## 2017-03-20 NOTE — Anesthesia Postprocedure Evaluation (Signed)
Anesthesia Post Note  Patient: Christine Dunn  Procedure(s) Performed: Procedure(s) (LRB): CESAREAN SECTION (N/A)     Patient location during evaluation: PACU Anesthesia Type: Epidural Level of consciousness: awake and alert Pain management: pain level controlled Vital Signs Assessment: post-procedure vital signs reviewed and stable Respiratory status: spontaneous breathing and respiratory function stable Cardiovascular status: blood pressure returned to baseline and stable Postop Assessment: spinal receding Anesthetic complications: no    Last Vitals:  Vitals:   03/20/17 2230 03/20/17 2252  BP: 134/86 (!) 141/87  Pulse: (!) 106 (!) 102  Resp: (!) 23 20  Temp: 36.7 C 36.8 C    Last Pain:  Vitals:   03/20/17 2252  TempSrc: Axillary  PainSc:    Pain Goal:                 Doug Bucklin DANIEL

## 2017-03-20 NOTE — Progress Notes (Signed)
Cx no change FHT cat one MVU 240 A/P: arrest of dilation. Rec: c/s. D/W patient procedure and risks including infection, organ damage, bleeding/transfusion-HIV/Hep, DVT/PE, pneumonia. Patient states she understands and agrees

## 2017-03-20 NOTE — Transfer of Care (Signed)
Immediate Anesthesia Transfer of Care Note  Patient: Christine Dunn  Procedure(s) Performed: Procedure(s): CESAREAN SECTION (N/A)  Patient Location: PACU  Anesthesia Type:Epidural  Level of Consciousness: awake, alert  and oriented  Airway & Oxygen Therapy: Patient Spontanous Breathing  Post-op Assessment: Report given to RN and Post -op Vital signs reviewed and stable  Post vital signs: Reviewed and stable  Last Vitals:  Vitals:   03/20/17 1901 03/20/17 1931  BP: 119/80 126/86  Pulse: (!) 101 (!) 101  Resp:    Temp:  37 C    Last Pain:  Vitals:   03/20/17 1931  TempSrc: Oral  PainSc:          Complications: No apparent anesthesia complications

## 2017-03-20 NOTE — Progress Notes (Addendum)
Inpatient Diabetes Program Recommendations  Diabetes Treatment Program Recommendations  ADA Standards of Care 2018 Diabetes in Pregnancy Target Glucose Ranges:  Fasting: 60 - 90 mg/dL Preprandial: 60 - 161105 mg/dL 1 hr postprandial: Less than 140mg /dL (from first bite of meal) 2 hr postprandial: Less than 120 mg/dL (from first bite of meal)   Results for Zeb ComfortMCADEN, Emoree L (MRN [id number]) as of 03/20/2017 10:15  Ref. Range 03/20/2017 01:51 03/20/2017 06:10 03/20/2017 06:43 03/20/2017 10:10  Glucose-Capillary Latest Ref Range: 65 - 99 mg/dL 72 50 (L) 66 811108 (H)   Review of Glycemic Control  Diabetes history: DM1 Outpatient Diabetes medications: Lantus 40 units QHS, Novolog 1 unit for every 3 grams of carbs, Novlog 1 unit for every 50 mg/dl above target glucose of 150 mg/dl Current orders for Inpatient glycemic control: CBG monitoring Q4H  NOTE: In reviewing chart, noted patient has DM1 (makes no insulin at all; will require basal, meal coverage, and correction insulin) and is [redacted]W[redacted]D and is being induced. Currently glucose is trending 50-72 mg/dl since being admitted and glucose is being closely monitored. Per patient report, she last took Lantus on 7/23 at night and her Novolog sliding scale on 7/24 1800. Patient is followed by Dr. Katrinka BlazingSmith (Endocrinologist) and was last seen by him on 02/11/17. Per Care Everywhere, reviewed Dr. Michaelle CopasSmith's office notes and patient has long history of very poor DM control. A1C was 9.9% on 02/11/17 and noted her A1C was 13.1% on 06/26/16 (prior to pregnancy).  During labor patient will likely not require much insulin and expect glucose to trend back up after delivery. Patient will need to be ordered insulin following delivery but at much lower doses.  Diabetes Coordinator will continue to follow along but if questions or concerns arise please page Diabetes Coordinator.  Recommendations During labor: Recommend CBGs Q2H and if glucose is 120 mg/dl or greater then use IV insulin  with CBGs Q1H to keep glucose well controlled. Recommendations Following delivery: Order CBGs with Novolog 0-9 units Q4H (using regular Glycemic Control order set). Once glucose is consistently greater than 150 mg/dl, recommend ordering Lantus 20 units Q24H and then add Novolog 3 units TID with meals for meal coverage if patient is eating at least 50% of meals.  Thanks, Orlando PennerMarie Zuly Belkin, RN, MSN, CDE Diabetes Coordinator Inpatient Diabetes Program [phone] (Team Pager from 8am to 5pm)

## 2017-03-20 NOTE — Progress Notes (Signed)
03/20/2017  9:00 PM  PATIENT:  Caily L Wank  26 y.o. female  PRE-OPERATIVE DIAGNOSIS:  Primary Cesarean Section for failure to progress  POST-OPERATIVE DIAGNOSIS:  Primary Cesarean Section for failure to progress  PROCEDURE:  Procedure(s): CESAREAN SECTION (N/A)  SURGEON:  Surgeon(s) and Role:    * Harold Hedgeomblin, Jonathandavid Marlett, MD - Primary  PHYSICIAN ASSISTANT:   ASSISTANTS: none   ANESTHESIA:   epidural  EBL:  Total I/O In: 2000 [I.V.:2000] Out: 1150 [Urine:150; Blood:1000]  BLOOD ADMINISTERED:none  DRAINS: Urinary Catheter (Foley)   LOCAL MEDICATIONS USED:  NONE  SPECIMEN:  Source of Specimen:  placenta  DISPOSITION OF SPECIMEN:  PATHOLOGY  COUNTS:  YES  TOURNIQUET:  * No tourniquets in log *  DICTATION: .Other Dictation: Dictation Number [id number]  PLAN OF CARE: Admit to inpatient   PATIENT DISPOSITION:  PACU - hemodynamically stable.   Delay start of Pharmacological VTE agent (>24hrs) due to surgical blood loss or risk of bleeding: yes

## 2017-03-20 NOTE — Progress Notes (Signed)
Cx 5/puffy/-1 UCs q2-3 min IUPC placed FHT difficult to trace. FSE applied>poor signal. FSE replaced>FHT 120s Will watch FHT and labor curve closely BG 158>start Glucose Stabilizer

## 2017-03-20 NOTE — Progress Notes (Signed)
For diabetic coordinator- pt states she last took Lantus 7/23 at night and her Novolog sliding scale 7/24 1800

## 2017-03-20 NOTE — H&P (Signed)
Christine Dunn is a 26 y.o. female presenting for IOL. Patient has IDDM treated with insulin injections. Yesterday in office EFW 7# 11oz. Also BPs becoming more labile, 140-150s/90s. Also H/O HSV. Denies prodrome. Last outbreak >2 weeks ago. OB History    Gravida Para Term Preterm AB Living   1         0   SAB TAB Ectopic Multiple Live Births                 Past Medical History:  Diagnosis Date  . Anxiety   . Chronic pain of right knee 02/2013   followed by Orthopedist Dr. Eulah PontMurphy  . Depression 12/25/2011  . Diabetes type 1, uncontrolled (HCC) 08/27/1997  . Diabetic neuropathy (HCC) 01/08/2012  . Fatty liver   . Foot deformity, acquired    little toes, toes nail are falling off, needs surgery, but cant happen till AIC < 9  . Frequent falls    due to weakness of bilateral LE  . Herpes   . History of retinopathy    L eye  . Hyperlipidemia LDL goal < 100 12/19/2011  . Insomnia 01/30/2012  . Neuropathy   . Superficial skin infection 12/18/2011   Recurrent for past 2 years    Past Surgical History:  Procedure Laterality Date  . LEEP    . WISDOM TOOTH EXTRACTION  2015  . WRIST SURGERY Right 2010   rerouted tendons in wrist   Family History: family history includes Alcohol abuse in her brother; Asthma in her brother; COPD in her maternal grandfather and maternal grandmother; Depression in her mother; Diabetes in her maternal grandfather; Drug abuse in her brother; Gestational diabetes in her mother; Heart disease in her maternal grandfather; Hyperlipidemia in her father; Hypertension in her father and maternal grandfather; Irritable bowel syndrome in her mother; Lung cancer in her maternal grandfather and maternal uncle; Lymphoma in her maternal grandfather; Parkinson's disease in her maternal grandfather; Thyroid disease in her father. Social History:  reports that she has never smoked. She has never used smokeless tobacco. She reports that she does not drink alcohol or use drugs.      Maternal Diabetes: Yes:  Diabetes Type:  Pre-pregnancy Genetic Screening: Normal Maternal Ultrasounds/Referrals: Normal Fetal Ultrasounds or other Referrals:  None Maternal Substance Abuse:  No Significant Maternal Medications:  None Significant Maternal Lab Results:  None Other Comments:  None  Review of Systems  Eyes: Negative for blurred vision.  Gastrointestinal: Negative for abdominal pain.  Neurological: Negative for headaches.   Maternal Medical History:  Fetal activity: Perceived fetal activity is normal.      Dilation: 1 Effacement (%): Thick Station: -2 Exam by:: Minus Libertyhristy Leshowitz, RN Blood pressure 137/90, pulse 89, temperature 98.4 F (36.9 C), temperature source Oral, resp. rate 18, height 5' 3.5" (1.613 m), weight 170 lb (77.1 kg), last menstrual period 07/03/2016. Maternal Exam:  Abdomen: Patient reports no abdominal tenderness.   Fetal Exam Fetal State Assessment: Category I - tracings are normal.     Physical Exam  Cardiovascular: Normal rate and regular rhythm.   Respiratory: Effort normal and breath sounds normal.  GI: Soft. There is no tenderness.  Neurological: She has normal reflexes.    Prenatal labs: ABO, Rh: --/--/A POS, A POS (07/25 0155) Antibody: NEG (07/25 0155) Rubella: Immune (01/09 0000) RPR: Nonreactive (01/09 0000)  HBsAg:    HIV: Non-reactive (01/09 0000)  GBS: Positive (01/16 0000)   Results for orders placed or performed during the hospital  encounter of 03/20/17 (from the past 24 hour(s))  Glucose, capillary     Status: None   Collection Time: 03/20/17  1:51 AM  Result Value Ref Range   Glucose-Capillary 72 65 - 99 mg/dL  CBC     Status: None   Collection Time: 03/20/17  1:55 AM  Result Value Ref Range   WBC 9.1 4.0 - 10.5 K/uL   RBC 4.59 3.87 - 5.11 MIL/uL   Hemoglobin 12.1 12.0 - 15.0 g/dL   HCT 78.236.3 95.636.0 - 21.346.0 %   MCV 79.1 78.0 - 100.0 fL   MCH 26.4 26.0 - 34.0 pg   MCHC 33.3 30.0 - 36.0 g/dL   RDW 08.614.3 57.811.5 -  46.915.5 %   Platelets 207 150 - 400 K/uL  Type and screen [facility]      Status: None   Collection Time: 03/20/17  1:55 AM  Result Value Ref Range   ABO/RH(D) A POS    Antibody Screen NEG    Sample Expiration 03/23/2017   Comprehensive metabolic panel     Status: Abnormal   Collection Time: 03/20/17  1:55 AM  Result Value Ref Range   Sodium 134 (L) 135 - 145 mmol/L   Potassium 3.6 3.5 - 5.1 mmol/L   Chloride 103 101 - 111 mmol/L   CO2 21 (L) 22 - 32 mmol/L   Glucose, Bld 74 65 - 99 mg/dL   BUN 5 (L) 6 - 20 mg/dL   Creatinine, Ser 6.290.50 0.44 - 1.00 mg/dL   Calcium 8.9 8.9 - 52.810.3 mg/dL   Total Protein 6.4 (L) 6.5 - 8.1 g/dL   Albumin 2.5 (L) 3.5 - 5.0 g/dL   AST 26 15 - 41 U/L   ALT 14 14 - 54 U/L   Alkaline Phosphatase 167 (H) 38 - 126 U/L   Total Bilirubin 0.8 0.3 - 1.2 mg/dL   GFR calc non Af Amer >60 >60 mL/min   GFR calc Af Amer >60 >60 mL/min   Anion gap 10 5 - 15  Uric acid     Status: None   Collection Time: 03/20/17  1:55 AM  Result Value Ref Range   Uric Acid, Serum 4.8 2.3 - 6.6 mg/dL  ABO/Rh     Status: None   Collection Time: 03/20/17  1:55 AM  Result Value Ref Range   ABO/RH(D) A POS   Glucose, capillary     Status: Abnormal   Collection Time: 03/20/17  6:10 AM  Result Value Ref Range   Glucose-Capillary 50 (L) 65 - 99 mg/dL  Glucose, capillary     Status: None   Collection Time: 03/20/17  6:43 AM  Result Value Ref Range   Glucose-Capillary 66 65 - 99 mg/dL   Assessment/Plan: 26 yo G1P0 @ 37 1/7 weeks with gestational hypertension and IDDM D/W patient two stage induction of labor and risks Last Cytotec about 6am GBBS +, atb ordered HSV-no recent outbreaks or prodrome    Christine Dunn,Christine Dunn 03/20/2017, 7:56 AM

## 2017-03-20 NOTE — Anesthesia Pain Management Evaluation Note (Signed)
  CRNA Pain Management Visit Note  Patient: Christine Dunn, 26 y.o., female  "Hello I am a member of the anesthesia team at [facility]. We have an anesthesia team available at all times to provide care throughout the hospital, including epidural management and anesthesia for C-section. I don't know your plan for the delivery whether it a natural birth, water birth, IV sedation, nitrous supplementation, doula or epidural, but we want to meet your pain goals."   1.Was your pain managed to your expectations on prior hospitalizations?   No prior hospitalizations  2.What is your expectation for pain management during this hospitalization?     Epidural  3.How can we help you reach that goal?   Record the patient's initial score and the patient's pain goal.   Pain: 0  Pain Goal: 5 [facility] wants you to be able to say your pain was always managed very well.  Christine Dunn,Christine Dunn 03/20/2017

## 2017-03-20 NOTE — Progress Notes (Signed)
Cx 2.5/80/-2 AROM clear FHT cat one UCs q2-3 min

## 2017-03-20 NOTE — Anesthesia Preprocedure Evaluation (Signed)
Anesthesia Evaluation  Patient identified by MRN, date of birth, ID band Patient awake    Reviewed: Allergy & Precautions, Patient's Chart, lab work & pertinent test results  Airway Mallampati: I       Dental no notable dental hx.    Pulmonary neg pulmonary ROS,    Pulmonary exam normal        Cardiovascular hypertension, Normal cardiovascular exam     Neuro/Psych PSYCHIATRIC DISORDERS Anxiety Depression negative neurological ROS     GI/Hepatic GERD  ,(+) Hepatitis -  Endo/Other  negative endocrine ROSdiabetes, Type 1, Insulin Dependent  Renal/GU negative Renal ROS     Musculoskeletal negative musculoskeletal ROS (+)   Abdominal   Peds  Hematology negative hematology ROS (+)   Anesthesia Other Findings Day of surgery medications reviewed with the patient.  Reproductive/Obstetrics (+) Pregnancy                             Lab Results  Component Value Date   WBC 8.7 03/20/2017   HGB 11.9 (L) 03/20/2017   HCT 36.0 03/20/2017   MCV 79.6 03/20/2017   PLT 199 03/20/2017     Anesthesia Physical Anesthesia Plan  ASA: III  Anesthesia Plan: Epidural   Post-op Pain Management:    Induction:   PONV Risk Score and Plan:   Airway Management Planned:   Additional Equipment:   Intra-op Plan:   Post-operative Plan:   Informed Consent: I have reviewed the patients History and Physical, chart, labs and discussed the procedure including the risks, benefits and alternatives for the proposed anesthesia with the patient or authorized representative who has indicated his/her understanding and acceptance.     Plan Discussed with:   Anesthesia Plan Comments:         Anesthesia Quick Evaluation

## 2017-03-20 NOTE — Progress Notes (Signed)
MFM U/S on 03/19/17 notes Urinary tract dilation of right kidney, possible duplicated collecting system with dilated upper pole. Notify peds at delivery for imaging after delivery.

## 2017-03-20 NOTE — Consult Note (Addendum)
Neonatology Note:   Attendance at C-section:    I was asked by Dr. Henderson Cloudomblin to attend this primary C/S at 37 1/7 weeks after IOL, for FTP. The mother is a G1P0 A pos, GBS pos, with IDDM poorly controlled and polyhydramnios. Fetal ultrasound exam showed urinary tract dilation on the right side with a possible duplicated collecting system. IOL was due to gestational hypertension and uncontrolled DM. ROM 9 hours prior to delivery, fluid clear. Mother got Clindamycin > 4 hours before delivery and a dose of Gentamicin 1/2 hour PTD. Infant had good tone and HR, but did not cry with gusto. Delayed cord clamping was done. Needed only minimal bulb suctioning, stimulation given to encourage crying. Lungs initially with rales throughout, so we did chest PT with some clearing. However, her respiratory effort remained sluggish and her O2 sat was 78% at 5 minutes, so we started BBO2. She maintained normal HR at all times and had good tone, but air exchange remained decreased and she had minimal subcostal retractions. We were able to wean her FIO2 down to almost room air, but she did not maintain adequate saturations in room air, so we opted to transfer to CN for O2 hood and close observation. Ap 8/9. I spoke with her parents and her father accompanied the baby to the CN. Baby got BBO2 en route. I spoke with Dr. Sherryll BurgerBen-Davies, infant's pediatrician, about the baby and she has assumed care.   Christine Souhristie C. Chayanne Speir, MD

## 2017-03-20 NOTE — Anesthesia Procedure Notes (Signed)
Epidural Patient location during procedure: OB Start time: 03/20/2017 1:12 PM End time: 03/20/2017 1:18 PM  Staffing Anesthesiologist: Shona SimpsonHOLLIS, Cabela Pacifico D Performed: anesthesiologist   Preanesthetic Checklist Completed: patient identified, site marked, surgical consent, pre-op evaluation, timeout performed, IV checked, risks and benefits discussed and monitors and equipment checked  Epidural Patient position: sitting Prep: ChloraPrep Patient monitoring: heart rate, continuous pulse ox and blood pressure Approach: midline Location: L3-L4 Injection technique: LOR saline  Needle:  Needle type: Tuohy  Needle gauge: 17 G Needle length: 9 cm Catheter type: closed end flexible Catheter size: 20 Guage Test dose: negative and 1.5% lidocaine  Assessment Events: blood not aspirated, injection not painful, no injection resistance and no paresthesia  Additional Notes LOR @ 4.5  Patient identified. Risks/Benefits/Options discussed with patient including but not limited to bleeding, infection, nerve damage, paralysis, failed block, incomplete pain control, headache, blood pressure changes, nausea, vomiting, reactions to medications, itching and postpartum back pain. Confirmed with bedside nurse the patient's most recent platelet count. Confirmed with patient that they are not currently taking any anticoagulation, have any bleeding history or any family history of bleeding disorders. Patient expressed understanding and wished to proceed. All questions were answered. Sterile technique was used throughout the entire procedure. Please see nursing notes for vital signs. Test dose was given through epidural catheter and negative prior to continuing to dose epidural or start infusion. Warning signs of high block given to the patient including shortness of breath, tingling/numbness in hands, complete motor block, or any concerning symptoms with instructions to call for help. Patient was given instructions on  fall risk and not to get out of bed. All questions and concerns addressed with instructions to call with any issues or inadequate analgesia.    Reason for block:procedure for pain

## 2017-03-21 ENCOUNTER — Encounter (HOSPITAL_COMMUNITY): Payer: Self-pay

## 2017-03-21 LAB — GLUCOSE, CAPILLARY
GLUCOSE-CAPILLARY: 161 mg/dL — AB (ref 65–99)
GLUCOSE-CAPILLARY: 55 mg/dL — AB (ref 65–99)
GLUCOSE-CAPILLARY: 125 mg/dL — AB (ref 65–99)
GLUCOSE-CAPILLARY: 221 mg/dL — AB (ref 65–99)
GLUCOSE-CAPILLARY: 66 mg/dL (ref 65–99)
GLUCOSE-CAPILLARY: 73 mg/dL (ref 65–99)
Glucose-Capillary: 158 mg/dL — ABNORMAL HIGH (ref 65–99)
Glucose-Capillary: 171 mg/dL — ABNORMAL HIGH (ref 65–99)
Glucose-Capillary: 176 mg/dL — ABNORMAL HIGH (ref 65–99)
Glucose-Capillary: 51 mg/dL — ABNORMAL LOW (ref 65–99)
Glucose-Capillary: 69 mg/dL (ref 65–99)
Glucose-Capillary: 69 mg/dL (ref 65–99)
Glucose-Capillary: 85 mg/dL (ref 65–99)

## 2017-03-21 LAB — CBC
HCT: 26.9 % — ABNORMAL LOW (ref 36.0–46.0)
Hemoglobin: 9.1 g/dL — ABNORMAL LOW (ref 12.0–15.0)
MCH: 26.8 pg (ref 26.0–34.0)
MCHC: 33.8 g/dL (ref 30.0–36.0)
MCV: 79.4 fL (ref 78.0–100.0)
PLATELETS: 168 10*3/uL (ref 150–400)
RBC: 3.39 MIL/uL — ABNORMAL LOW (ref 3.87–5.11)
RDW: 14.4 % (ref 11.5–15.5)
WBC: 12.8 10*3/uL — AB (ref 4.0–10.5)

## 2017-03-21 MED ORDER — NALBUPHINE HCL 10 MG/ML IJ SOLN: 5.0000 mg | Freq: Once | INTRAMUSCULAR | Status: AC | PRN

## 2017-03-21 MED ORDER — INSULIN GLARGINE 100 UNIT/ML ~~LOC~~ SOLN
20.0000 [IU] | SUBCUTANEOUS | Status: DC
  Administered 2017-03-22: 20 [IU] via SUBCUTANEOUS
  Filled 2017-03-21 (×3): qty 0.2

## 2017-03-21 MED ORDER — ONDANSETRON HCL 4 MG/2ML IJ SOLN
4.0000 mg | INTRAMUSCULAR | Status: DC | PRN
  Administered 2017-03-21: 4 mg via INTRAVENOUS
  Filled 2017-03-21: qty 2

## 2017-03-21 MED ORDER — NALBUPHINE HCL 10 MG/ML IJ SOLN
5.0000 mg | Freq: Once | INTRAMUSCULAR | Status: AC | PRN
  Administered 2017-03-21: 5 mg via SUBCUTANEOUS

## 2017-03-21 MED ORDER — DEXTROSE-NACL 5-0.9 % IV SOLN: INTRAVENOUS | Status: DC

## 2017-03-21 MED ORDER — INSULIN GLARGINE 100 UNIT/ML ~~LOC~~ SOLN: 20.0000 [IU] | Freq: Every day | SUBCUTANEOUS | Status: DC

## 2017-03-21 MED ORDER — NALOXONE HCL 2 MG/2ML IJ SOSY
1.0000 ug/kg/h | PREFILLED_SYRINGE | INTRAVENOUS | Status: DC | PRN
  Filled 2017-03-21: qty 2

## 2017-03-21 MED ORDER — INSULIN ASPART 100 UNIT/ML ~~LOC~~ SOLN: 3.0000 [IU] | Freq: Three times a day (TID) | SUBCUTANEOUS | Status: DC

## 2017-03-21 MED ORDER — INSULIN ASPART 100 UNIT/ML ~~LOC~~ SOLN
3.0000 [IU] | Freq: Three times a day (TID) | SUBCUTANEOUS | Status: DC
  Administered 2017-03-21 – 2017-03-23 (×6): 3 [IU] via SUBCUTANEOUS

## 2017-03-21 MED ORDER — NALBUPHINE HCL 10 MG/ML IJ SOLN
5.0000 mg | INTRAMUSCULAR | Status: DC | PRN
  Filled 2017-03-21: qty 1

## 2017-03-21 MED ORDER — NALOXONE HCL 0.4 MG/ML IJ SOLN: 0.4000 mg | INTRAMUSCULAR | Status: DC | PRN

## 2017-03-21 MED ORDER — SODIUM CHLORIDE 0.9% FLUSH: 3.0000 mL | INTRAVENOUS | Status: DC | PRN

## 2017-03-21 MED ORDER — SCOPOLAMINE 1 MG/3DAYS TD PT72
1.0000 | MEDICATED_PATCH | Freq: Once | TRANSDERMAL | Status: DC
  Filled 2017-03-21: qty 1

## 2017-03-21 MED ORDER — INSULIN ASPART 100 UNIT/ML ~~LOC~~ SOLN
0.0000 [IU] | Freq: Three times a day (TID) | SUBCUTANEOUS | Status: DC
  Administered 2017-03-21: 2 [IU] via SUBCUTANEOUS
  Administered 2017-03-22: 1 [IU] via SUBCUTANEOUS
  Administered 2017-03-22 (×2): 2 [IU] via SUBCUTANEOUS
  Administered 2017-03-23: 3 [IU] via SUBCUTANEOUS

## 2017-03-21 MED ORDER — ONDANSETRON HCL 4 MG/2ML IJ SOLN: 4.0000 mg | Freq: Three times a day (TID) | INTRAMUSCULAR | Status: DC | PRN

## 2017-03-21 MED ORDER — INSULIN GLARGINE 100 UNIT/ML ~~LOC~~ SOLN
20.0000 [IU] | Freq: Every day | SUBCUTANEOUS | Status: DC
  Administered 2017-03-21: 20 [IU] via SUBCUTANEOUS
  Filled 2017-03-21: qty 0.2

## 2017-03-21 MED ORDER — DIPHENHYDRAMINE HCL 50 MG/ML IJ SOLN: 12.5000 mg | INTRAMUSCULAR | Status: DC | PRN

## 2017-03-21 MED ORDER — DIPHENHYDRAMINE HCL 25 MG PO CAPS: 25.0000 mg | ORAL_CAPSULE | ORAL | Status: DC | PRN

## 2017-03-21 MED ORDER — DEXTROSE-NACL 5-0.45 % IV SOLN
INTRAVENOUS | Status: DC
  Administered 2017-03-21: 06:00:00 via INTRAVENOUS

## 2017-03-21 MED ORDER — NALBUPHINE HCL 10 MG/ML IJ SOLN: 5.0000 mg | INTRAMUSCULAR | Status: DC | PRN

## 2017-03-21 MED ORDER — INSULIN ASPART 100 UNIT/ML ~~LOC~~ SOLN: 0.0000 [IU] | Freq: Every day | SUBCUTANEOUS | Status: DC

## 2017-03-21 NOTE — Progress Notes (Signed)
MOB was referred for history of depression/anxiety. * Referral screened out by Clinical Social Worker because none of the following criteria appear to apply: ~ History of anxiety/depression during this pregnancy, or of post-partum depression. ~ Diagnosis of anxiety and/or depression within last 3 years AND * MOB's symptoms currently being treated with medication and/or therapy. Please contact the Clinical Social Worker if needs arise, if MOB requests, or if MOB scores 9 or greater/yes for question 10 on the Edinburgh Postpartum Depression Screen.  MOB has Rx for Zoloft.

## 2017-03-21 NOTE — Anesthesia Postprocedure Evaluation (Signed)
Anesthesia Post Note  Patient: Christine Dunn  Procedure(s) Performed: Procedure(s) (LRB): CESAREAN SECTION (N/A)     Patient location during evaluation: Mother Baby Anesthesia Type: Epidural Level of consciousness: awake, awake and alert, oriented and patient cooperative Pain management: pain level controlled Vital Signs Assessment: post-procedure vital signs reviewed and stable Respiratory status: spontaneous breathing, nonlabored ventilation and respiratory function stable Cardiovascular status: stable Postop Assessment: no headache, no backache, patient able to bend at knees and no signs of nausea or vomiting Anesthetic complications: no    Last Vitals:  Vitals:   03/21/17 0115 03/21/17 0330  BP: 128/72 127/74  Pulse: (!) 104 (!) 105  Resp: 20 20  Temp: 36.4 C 37.1 C    Last Pain:  Vitals:   03/21/17 0330  TempSrc: Oral  PainSc:    Pain Goal:                 Jann Ra L

## 2017-03-21 NOTE — Progress Notes (Signed)
Inpatient Diabetes Program Recommendations  AACE/ADA: New Consensus Statement on Inpatient Glycemic Control (2015)  Target Ranges:  Prepandial:   less than 140 mg/dL      Peak postprandial:   less than 180 mg/dL (1-2 hours)      Critically ill patients:  140 - 180 mg/dL   Results for Zeb ComfortMCADEN, Jacyln L (MRN [id number]) as of 03/21/2017 12:20  Ref. Range 03/21/2017 00:24 03/21/2017 03:57 03/21/2017 04:41 03/21/2017 05:10 03/21/2017 05:56 03/21/2017 08:08 03/21/2017 11:51  Glucose-Capillary Latest Ref Range: 65 - 99 mg/dL 409158 (H) 69 55 (L) 51 (L) 85 125 (H) 221 (H)   Results for Zeb ComfortMCADEN, Mishael L (MRN [id number]) as of 03/21/2017 12:20  Ref. Range 03/20/2017 06:10 03/20/2017 06:43 03/20/2017 10:10 03/20/2017 13:42 03/20/2017 17:56 03/20/2017 19:33 03/20/2017 21:29  Glucose-Capillary Latest Ref Range: 65 - 99 mg/dL 50 (L) 66 956108 (H) 213125 (H) 158 (H) 178 (H) 161 (H)   Review of Glycemic Control  Diabetes history: DM1 (dx at age 89; makes no insulin) Outpatient Diabetes medications: Lantus 40 units QHS, Novolog 5 units TID with meals plus addition units for correction Current orders for Inpatient glycemic control: Lantus 20 units QHS, Novolog 0-9 units Q4H, Novolog 3 units TID with meals for meal coverage  Inpatient Diabetes Program Recommendations: Insulin - Basal: Please consider changing Lantus 20 units QHS frequency to Q24H starting now so that patient will receive basal insulin now. Correction (SSI): Please consisder changing frequency of Novolog 0-9 units TID with meals and adding Novolog 0-5 units QHS for bedtime correction scale.  NOTE: Received page from [place]ly, [place] regarding recent insulin orders. Patient delivered at 8:30pm last night and was on an insulin drip until around 10:00pm last night. Patient did not receive any basal insulin when drip stopped and glucose ranged from 51-158 mg/dl from midnight to 0:868:08 am today. Current CBG up to 221 mg/dl. Spoke with patient over phone and she confirms that she was  dx with DM1 at the age of 26 years old, she is followed by Dr. Katrinka BlazingSmith (Endocrinologist), and that prior to pregnancy she was taking Lantus 40 units QHS, Novolog meal coverage, plus Novolog correction. Discussed insulin sensitivity and insulin needs during labor/delivery and post delivery. Discussed current insulin orders and informed patient that it would be recommended to change Lantus to 20 units Q24H to be given now (since no basal insulin has been given since admitted). Also talked with Meriam SpragueBeverly, RN regarding recommendations. Talked with Dr. Rana SnareLowe and received orders to make changes.  Thanks, Orlando PennerMarie Sakeenah Valcarcel, RN, MSN, CDE Diabetes Coordinator Inpatient Diabetes Program [phone] (Team Pager from 8am to 5pm)

## 2017-03-21 NOTE — Addendum Note (Signed)
Addendum  created 03/21/17 [id number] by Yolonda Kidaarver, Mad[place] First L, CRNA   Sign clinical note

## 2017-03-21 NOTE — Progress Notes (Signed)
Subjective: Postpartum Day 1: Cesarean Delivery Patient reports incisional pain and tolerating PO.    Objective: Vital signs in last 24 hours: Temp:  [97.3 F (36.3 C)-99.4 F (37.4 C)] 98.6 F (37 C) (07/26 0730) Pulse Rate:  [85-110] 92 (07/26 0730) Resp:  [14-24] 18 (07/26 0730) BP: (103-154)/(68-95) 131/70 (07/26 0730) SpO2:  [96 %-100 %] 96 % (07/25 2252)  Physical Exam:  General: alert, cooperative, appears stated age and no distress Lochia: appropriate Uterine Fundus: firm Incision: healing well DVT Evaluation: No evidence of DVT seen on physical exam.   Recent Labs  03/20/17 2205 03/21/17 0253  HGB 10.5* 9.1*  HCT 32.4* 26.9*    Assessment/Plan: Status post Cesarean section. Postoperative course complicated by IDDM  Sliding scale insulin per endocrine and diabetic coordinator.  Christine Dunn C 03/21/2017, 9:32 AM

## 2017-03-21 NOTE — Op Note (Signed)
NAME:  Christine Dunn, Christine Dunn                     ACCOUNT NO.:  MEDICAL RECORD NO.:  [id number]  LOCATION:                                 FACILITY:  PHYSICIAN:  Guy SandiferJames E. Henderson Cloudomblin, M.D.      DATE OF BIRTH:  DATE OF PROCEDURE:  03/20/2017 DATE OF DISCHARGE:                              OPERATIVE REPORT   [facility]  [facility] [place]  PREOPERATIVE DIAGNOSIS:  Arrest of dilation.  POSTOPERATIVE DIAGNOSIS:  Arrest of dilation.  PROCEDURE:  Primary low transverse cesarean section.  SURGEON:  Guy SandiferJames E. Henderson Cloudomblin, M.D.  ANESTHESIA:  Epidural.  ESTIMATED BLOOD LOSS:  900 mL.  FINDINGS:  Viable female infant, Apgars pending.  Birth weight pending. Arterial cord pH 7.19.  SPECIMENS:  Placenta to Pathology.  INDICATIONS AND CONSENT:  This patient is a 26 year old G1, P0, at 7637- 1/7th weeks, who was admitted for 2-stage induction of labor because of poorly controlled diabetes as well as increasingly labile blood pressures consistent with gestational hypertension.  Pregnancy was also complicated by a dilated, possibly duplicated collecting system on one of the baby's kidneys.  The patient progressed with rupture of membranes for clear fluid and she progressed to 5 cm dilation.  With excellent labor, she failed to progress beyond that point.  Cesarean section was recommended.  Potential risks, complications were reviewed preoperatively including, but not limited to, infection, organ damage, bleeding requiring transfusion of blood products with HIV and hepatitis acquisition, DVT, PE, pneumonia.  The patient states she understands and agrees, and consent is signed on the chart.  DESCRIPTION OF PROCEDURE:  The patient was taken to the operating room where she was identified and epidural anesthetic was augmented to a surgical level.  She was placed in the dorsal supine position with 15- degree left lateral wedge.  She was then prepped vaginally and abdominally  per protocol with Betadine vaginally and ChloraPrep abdominally.  After 3-minute drying time, she was draped in a sterile fashion.  Time-out was undertaken.  After testing for adequate epidural anesthesia, skin was entered through a Pfannenstiel incision, and dissection was carried out in layers to the peritoneum which was incised and extended superiorly and inferiorly.  Vesicouterine peritoneum was taken down cephalolaterally, the bladder flap was developed, the bladder blade was placed.  Uterus was incised in a low transverse manner.  The uterine cavity was entered bluntly with a hemostat.  Uterine incision was extended cephalolaterally with fingers.  Vertex was then delivered and nuchal cord x1 was reduced.  Baby was then completely delivered, and after 1-minute delay, cord was clamped and cut and the baby was handed to awaiting pediatrics team.  Placenta was manually delivered and sent to Pathology.  Uterus was closed in 2 running locking imbricating layers of 0 Monocryl suture which achieved good hemostasis.  Tubes and ovaries were normal bilaterally.  Anterior peritoneum was closed in a running fashion with 0 Monocryl suture which was also used to reapproximate the pyramidalis muscle in midline.  Anterior rectus fascia was closed in a running fashion with a 0 looped PDS suture.  Subcu was closed with interrupted plain, and the skin was  closed in a subcuticular fashion with 4-0 Vicryl on a Keith needle.  Steri-Strips and pressure dressing was applied.  All counts were correct.  The patient was taken to recovery room in stable condition.     Guy SandiferJames E. Henderson Cloudomblin, M.D.     JET/MEDQ  D:  03/20/2017  T:  03/20/2017  Job:  [id number]

## 2017-03-21 NOTE — Progress Notes (Signed)
CBG was 69 at 2200- pt denies sx of hypoglcemia- given 4 oz of apple juice and bedtime snack- pt to call when done  With juice for CBG recheck

## 2017-03-22 LAB — GLUCOSE, CAPILLARY
GLUCOSE-CAPILLARY: 137 mg/dL — AB (ref 65–99)
Glucose-Capillary: 183 mg/dL — ABNORMAL HIGH (ref 65–99)
Glucose-Capillary: 182 mg/dL — ABNORMAL HIGH (ref 65–99)
Glucose-Capillary: 132 mg/dL — ABNORMAL HIGH (ref 65–99)

## 2017-03-22 NOTE — Progress Notes (Signed)
Subjective: Postpartum Day 2: Cesarean Delivery Patient reports incisional pain, tolerating PO and no problems voiding.    Objective: Vital signs in last 24 hours: Temp:  [98.1 F (36.7 C)-98.5 F (36.9 C)] 98.5 F (36.9 C) (07/27 0534) Pulse Rate:  [91-100] 91 (07/27 0534) Resp:  [18-20] 20 (07/27 0534) BP: (127-139)/(84-88) 139/88 (07/27 0534) SpO2:  [98 %-100 %] 98 % (07/26 1500)  Physical Exam:  General: alert and cooperative Lochia: appropriate Uterine Fundus: firm Incision: healing well, no significant drainage DVT Evaluation: No evidence of DVT seen on physical exam.   Recent Labs  03/20/17 2205 03/21/17 0253  HGB 10.5* 9.1*  HCT 32.4* 26.9*    Assessment/Plan: Status post Cesarean section. Doing well postoperatively.  Continue current care.  Arriona Prest 03/22/2017, 8:44 AM

## 2017-03-22 NOTE — Progress Notes (Signed)
Inpatient Diabetes Program Recommendations  AACE/ADA: New Consensus Statement on Inpatient Glycemic Control (2015)  Target Ranges:  Prepandial:   less than 140 mg/dL      Peak postprandial:   less than 180 mg/dL (1-2 hours)      Critically ill patients:  140 - 180 mg/dL   Results for Zeb ComfortMCADEN, [place] L (MRN [id number]) as of 03/22/2017 09:37  Ref. Range 03/21/2017 05:10 03/21/2017 05:56 03/21/2017 08:08 03/21/2017 11:51 03/21/2017 15:55 03/21/2017 17:46 03/21/2017 21:58 03/21/2017 22:33 03/21/2017 22:56 03/22/2017 09:28  Glucose-Capillary Latest Ref Range: 65 - 99 mg/dL 51 (L) 85 409125 (H) 811221 (H) 171 (H) 176 (H) 69 66 73 137 (H)   Review of Glycemic Control  Diabetes history: DM1 (dx at age 89; makes no insulin) Outpatient Diabetes medications: Lantus 40 units QHS, Novolog 5 units TID with meals plus addition units for correction Current orders for Inpatient glycemic control: Lantus 20 units Q24H, Novolog 0-9 units TID with meals, Novolog 0-5 units QHS, Novolog 3 units TID with meals for meal coverage  Inpatient Diabetes Program Recommendations: Insulin-Meal Coverage: Nursing: Please be sure patient eats at least 50% of meals when giving meal coverage.  Agree with current insulin orders. Will continue to follow and make further recommendations as more data is collected.  Thanks, Orlando PennerMarie Kortney Schoenfelder, RN, MSN, CDE Diabetes Coordinator Inpatient Diabetes Program [phone] (Team Pager from 8am to 5pm)

## 2017-03-22 NOTE — Progress Notes (Signed)
Hypoglycemic Event  CBG:69  Treatment: 15 GM carbohydrate snack  Symptoms: None  Follow-up CBG: Time:2233 CBG Result:66  Possible Reasons for Event: Unknown  Comments/MD notified retreated    Willaim BaneBrown, Zhaniya Swallows A

## 2017-03-22 NOTE — Plan of Care (Signed)
Problem: Pain Management: Goal: General experience of comfort will improve and pain level will decrease Discussed pain levels per unit, and assessed and re-assessed pain through shift.

## 2017-03-22 NOTE — Progress Notes (Signed)
Hypoglycemic Event  CBG: 66  Treatment: 15 GM carbohydrate snack  Symptoms: None  Follow-up CBG: Time:2256 CBG Result:73  Possible Reasons for Event: Unknown  Comments/MD notified:bedtime snack eaten    Willaim BaneBrown, Lesette Frary A

## 2017-03-23 LAB — GLUCOSE, CAPILLARY: GLUCOSE-CAPILLARY: 221 mg/dL — AB (ref 65–99)

## 2017-03-23 MED ORDER — IBUPROFEN 600 MG PO TABS: 600.0000 mg | ORAL_TABLET | Freq: Four times a day (QID) | ORAL | 0 refills | Status: AC

## 2017-03-23 MED ORDER — OXYCODONE-ACETAMINOPHEN 5-325 MG PO TABS: 1.0000 | ORAL_TABLET | ORAL | 0 refills | Status: DC | PRN

## 2017-03-23 NOTE — Progress Notes (Signed)
CSW acknowledges consult for Edenberg score > 9. CSW met with MOB at bedside to assess.  MOB was warm and welcoming upon this writers arrival. Additionally, MOB notes she is on medication and see's a therapist currently.    CSW provided education regarding Baby Blues vs PMADs and provided MOB with information about support groups held at [facility].  CSW encouraged MOB to evaluate her mental health throughout the postpartum period with the use of the New Mom Checklist developed by Postpartum Progress and notify a medical professional if symptoms arise. CSW identifies no further need for intervention at this time or barriers to discharge.  Madison Albea, MSW, LCSW-A Clinical Social Worker  [facility]  Office: [phone]   

## 2017-03-23 NOTE — Discharge Summary (Signed)
Obstetric Discharge Summary Reason for Admission: induction of labor Prenatal Procedures: ultrasound Intrapartum Procedures: cesarean: low cervical, transverse Postpartum Procedures: none Complications-Operative and Postpartum: none Hemoglobin  Date Value Ref Range Status  03/21/2017 9.1 (L) 12.0 - 15.0 g/dL Final   HCT  Date Value Ref Range Status  03/21/2017 26.9 (L) 36.0 - 46.0 % Final    Physical Exam:  General: alert and cooperative Lochia: appropriate Uterine Fundus: firm Incision: healing well, no significant drainage DVT Evaluation: No evidence of DVT seen on physical exam.  Discharge Diagnoses: Term Pregnancy-delivered  Discharge Information: Date: 03/23/2017 Activity: pelvic rest Diet: routine Medications: PNV, Ibuprofen and Percocet Condition: stable Instructions: refer to practice specific booklet Discharge to: home Follow-up Information    [place] Schedule an appointment as soon as possible for a visit in 1 week(s).   Contact information: [address] [place] [id number] [phone]           Newborn Data: Live born female  Birth Weight: 7 lb 15.3 oz (3610 g) APGAR: 8, 9  Home with mother.  Christine Dunn 03/23/2017, 7:28 AM

## 2017-03-23 NOTE — Progress Notes (Signed)
CBG 221 at 1025 before breakfast. RN administered Novolog 6 units ( 3 units scheduled plus 3 units sliding scale). Dose verified by Maureen ChattersSally Williams, RN and patient.

## 2017-03-23 NOTE — Progress Notes (Signed)
Discharge education complete, prescription given, discharge instructions and follow up appointment discussed. Patient verbalized understanding.

## 2017-03-26 ENCOUNTER — Encounter (HOSPITAL_COMMUNITY): Payer: Self-pay

## 2017-03-26 ENCOUNTER — Other Ambulatory Visit (HOSPITAL_COMMUNITY): Payer: Self-pay

## 2017-04-16 DIAGNOSIS — R519 Headache, unspecified: Secondary | ICD-10-CM | POA: Insufficient documentation

## 2017-04-16 DIAGNOSIS — R51 Headache: Secondary | ICD-10-CM

## 2017-04-16 DIAGNOSIS — I493 Ventricular premature depolarization: Secondary | ICD-10-CM | POA: Insufficient documentation

## 2017-08-11 IMAGING — MR MR HEAD W/O CM
9 of 11 series · 35 of 48 positions shown · non-contrast
Comparison: None.

CLINICAL DATA: Cognitive impairment

EXAM:
MRI HEAD WITHOUT CONTRAST
TECHNIQUE: Multiplanar, multiecho pulse sequences of the brain and surrounding
structures were obtained without intravenous contrast.

[Series 2: T1 · sagittal · 5.0mm · 0.45mm/px · 4 of 27 slices shown (1 of 2)]
[im 1/27]
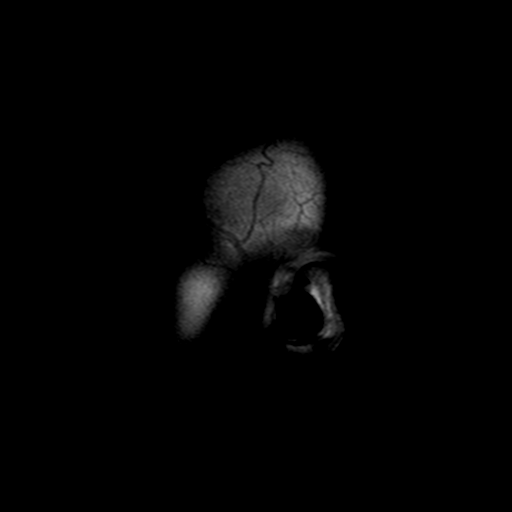
[im 9/27]
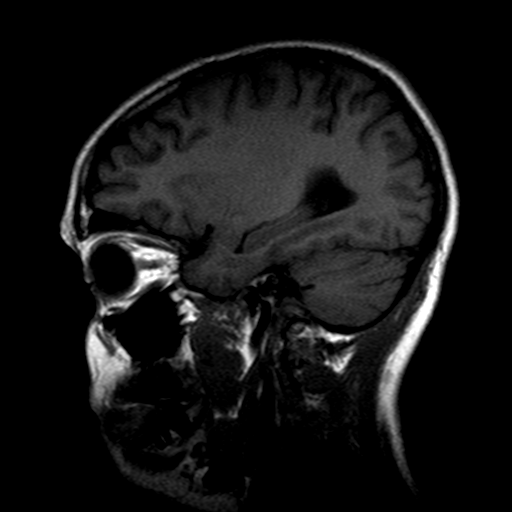
[im 18/27]
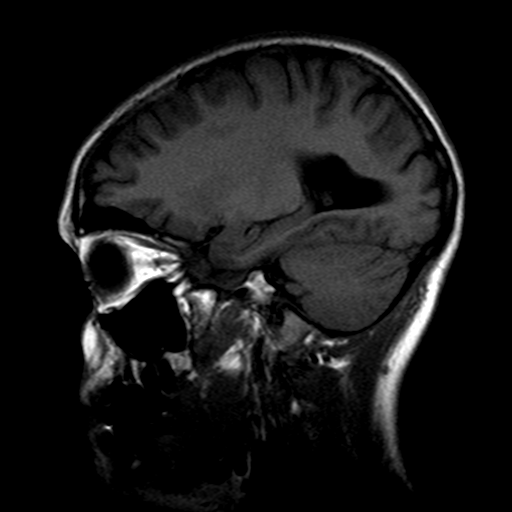
[im 27/27]
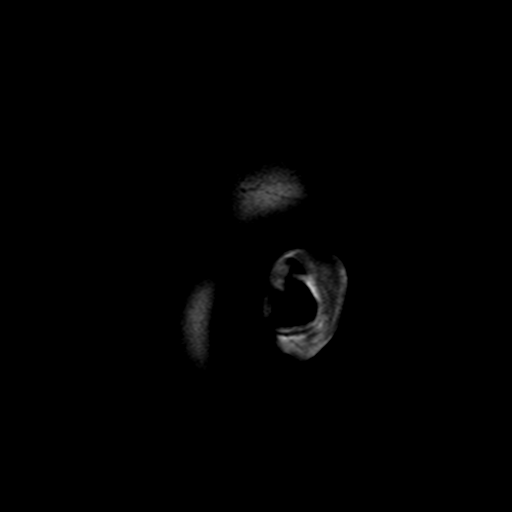

[Series 3: FLAIR · sagittal · 5.0mm · 0.90mm/px · 4 of 27 slices shown (1 of 2)]
[im 1/27]
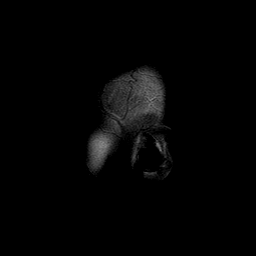
[im 9/27]
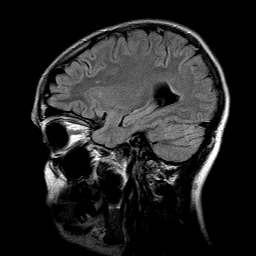
[im 18/27]
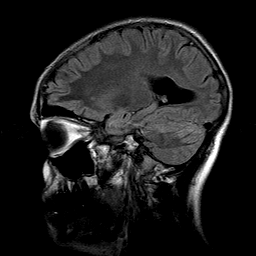
[im 27/27]
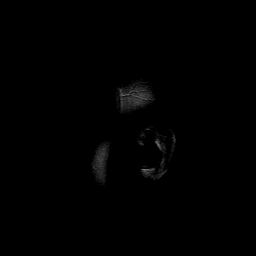

[Series 5: DWI · axial · 4.0mm · 0.94mm/px · z∈[-82,+89]mm · 6 of 44 slices shown (1 of 2)]
[im 1/44]
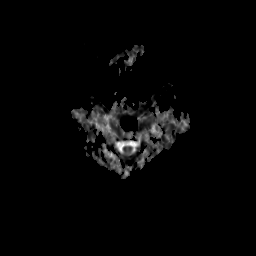
[im 9/44]
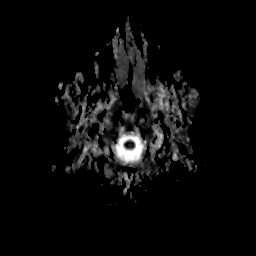
[im 18/44]
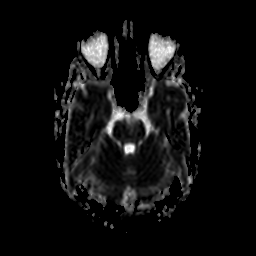
[im 26/44]
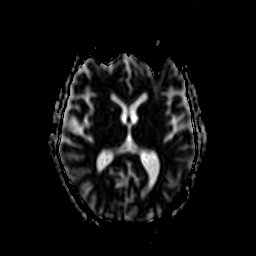
[im 35/44]
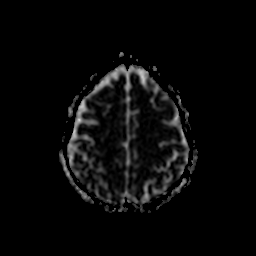
[im 44/44]
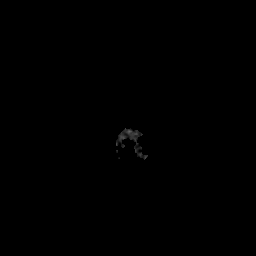

[Series 8: DWI · coronal · 5.0mm · 1.80mm/px · 5 of 39 slices shown (2 of 2)]
[im 1/39]
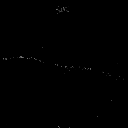
[im 10/39]
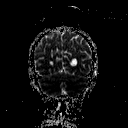
[im 20/39]
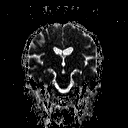
[im 29/39]
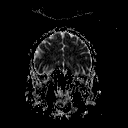
[im 39/39]
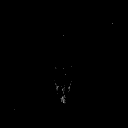

[Series 10: T2 · axial · 5.0mm · 0.45mm/px · z∈[-80,+88]mm · 3 of 27 slices shown (1 of 3)]
[im 1/27]
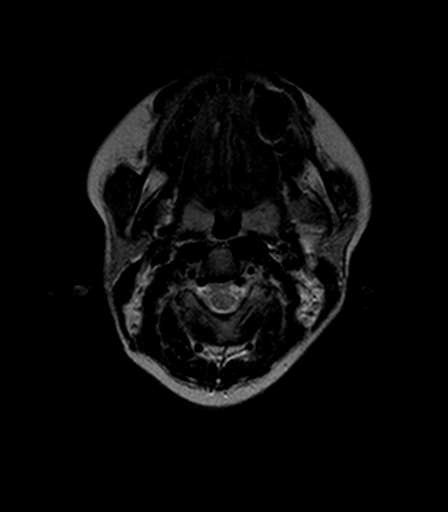
[im 14/27]
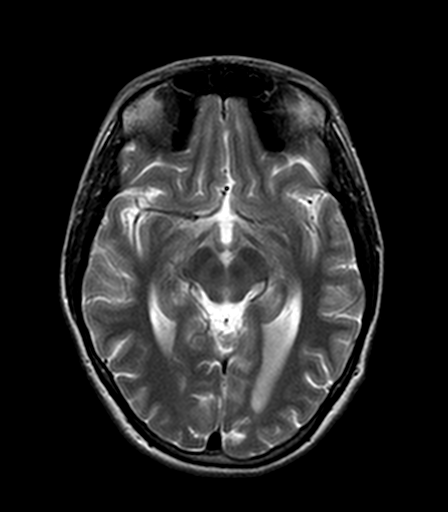
[im 27/27]
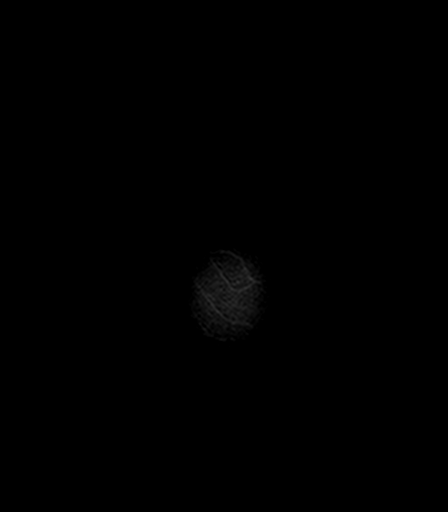

[Series 11: FLAIR · axial · 5.0mm · 0.90mm/px · z∈[-80,+88]mm · 3 of 27 slices shown (2 of 2)]
[im 1/27]
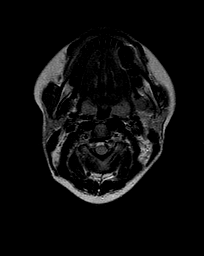
[im 14/27]
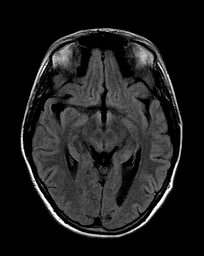
[im 27/27]
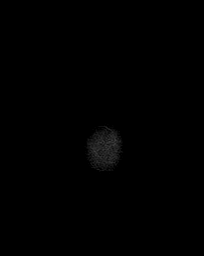

[Series 12: T2 · axial · 5.0mm · 0.45mm/px · z∈[-80,+88]mm · 3 of 27 slices shown (2 of 3)]
[im 1/27]
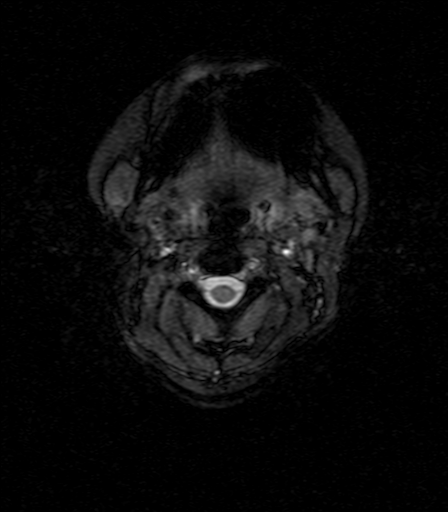
[im 14/27]
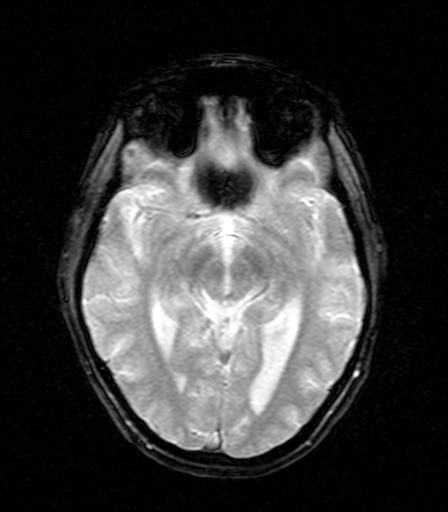
[im 27/27]
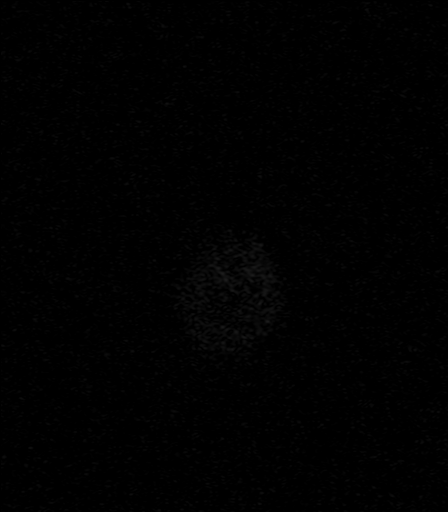

[Series 13: T1 · axial · 3.0mm · 0.45mm/px · z∈[-84,-27]mm · 3 of 60 slices shown (2 of 2)]
[im 1/60]
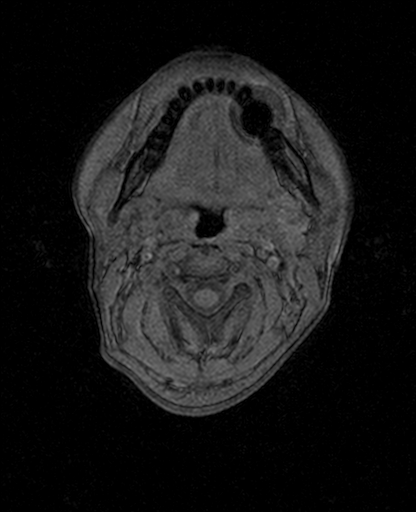
[im 10/60]
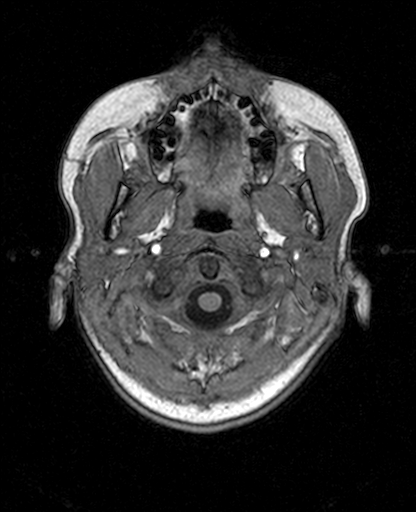
[im 20/60]
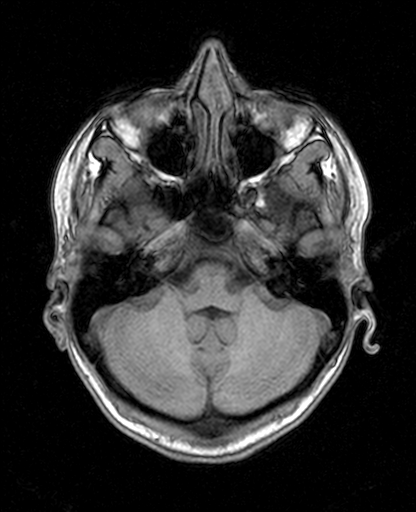

[Series 14: T2 · coronal · 5.0mm · 0.45mm/px · 4 of 31 slices shown (3 of 3)]
[im 1/31]
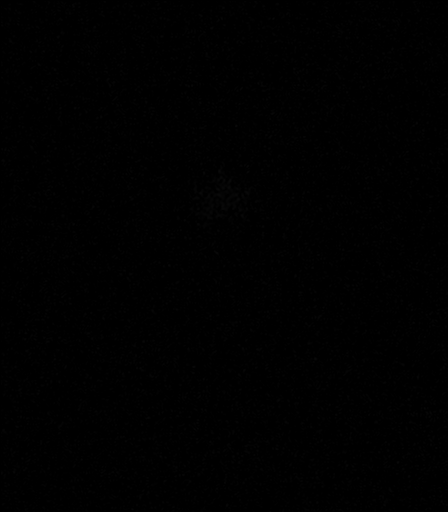
[im 11/31]
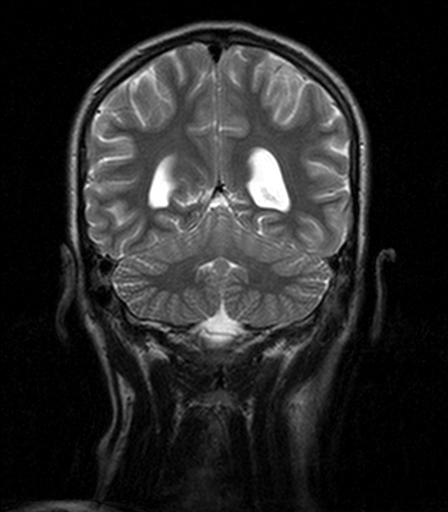
[im 21/31]
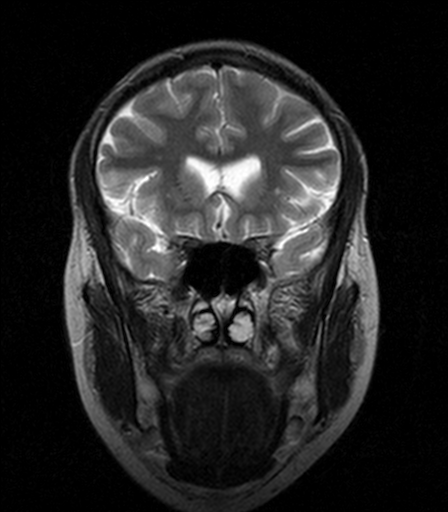
[im 31/31]
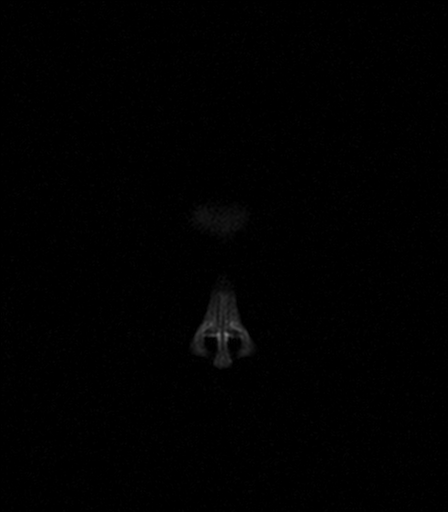

[35 of 48 positions shown; findings below may reference images not displayed]

FINDINGS: Brain: Ventricles and cerebral volume normal. Negative for acute
infarct. Several small white matter hyperintensities bilaterally
nonspecific. Brainstem and cerebellum normal. Negative for
hemorrhage or mass. No shift of the midline structures.

Vascular: Normal arterial flow voids.

Skull and upper cervical spine: Negative

Sinuses/Orbits: Negative

Other: None
IMPRESSION: Small frontal white matter hyperintensities bilaterally are
nonspecific and not likely symptomatic. Differential includes
chronic microvascular ischemia, migraine headache. Demyelinating
disease considered less likely.

## 2017-09-06 DIAGNOSIS — R42 Dizziness and giddiness: Secondary | ICD-10-CM | POA: Insufficient documentation

## 2017-09-30 ENCOUNTER — Ambulatory Visit: Payer: Medicaid Other | Admitting: Podiatry

## 2017-09-30 ENCOUNTER — Encounter: Payer: Self-pay | Admitting: Podiatry

## 2017-09-30 DIAGNOSIS — E1042 Type 1 diabetes mellitus with diabetic polyneuropathy: Secondary | ICD-10-CM

## 2017-09-30 NOTE — Progress Notes (Signed)
She presents today for follow-up of her diabetes and diabetic foot exam.  She has been pregnant and delivered a child since last time I saw her.  Delivered her daughter in July 2018.  She states that her blood sugars have not been under very good control and is still experiencing pain in her feet.  She states that her skin is dry and cracked in her heels she is concerned about infection.  Objective: Vital signs are stable she is alert and oriented x3 pulses are strongly palpable capillary fill time is immediate neurologic sensorium is diminished per Semmes Weinstein monofilament to the forefoot bilaterally.  Heel and the leg appears to be relatively normal.  Degenerative flexors are non-elicitable.  Muscle strength is symmetrical dorsiflexors plantar flexors inverters everters bilaterally.  Orthopedic evaluation demonstrates normal osseous architecture.  All joints distal to the ankle full range of motion without crepitation.  Cutaneous evaluation demonstrates dry xerotic skin with cracked fissures around the heel.  No large calluses no open lesions or wounds are noted.  Assessment: Insulin-dependent diabetes with diabetic peripheral neuropathy.  Poorly controlled diabetic.  Plan: Discussed etiology pathology conservative versus surgical therapies.  At this point she will continue skin breakdown most likely with high blood sugars.  I am not sure that her fissures will heal well.  I did instruct her to purchase a particular kind of cream for her heels.  She understands this and is amenable to it I will follow-up with her in the near future for reevaluation.

## 2018-03-31 ENCOUNTER — Encounter: Payer: Self-pay | Admitting: Podiatry

## 2018-03-31 ENCOUNTER — Ambulatory Visit: Payer: Medicaid Other | Admitting: Podiatry

## 2018-03-31 DIAGNOSIS — E1042 Type 1 diabetes mellitus with diabetic polyneuropathy: Secondary | ICD-10-CM

## 2018-03-31 NOTE — Progress Notes (Signed)
She presents today for diabetic foot exam.  States that her hemoglobin A1c is now much better controlled at 8.3.  She states that she feels a lot better with her diabetes being controlled.  She is has some numbness and tingling occasional sharp pain to the lateral toes bilaterally.  Objective: Vital signs are stable alert and oriented x3 there is no erythematous mild edema no cellulitis drainage or odor no reproducible pain on palpation.  Assessment: Diabetic peripheral neuropathy  Plan: Follow-up with me in 6 months

## 2018-05-02 ENCOUNTER — Ambulatory Visit: Payer: Medicaid Other | Admitting: Podiatry

## 2018-05-02 ENCOUNTER — Encounter: Payer: Self-pay | Admitting: Podiatry

## 2018-05-02 ENCOUNTER — Ambulatory Visit (INDEPENDENT_AMBULATORY_CARE_PROVIDER_SITE_OTHER): Payer: Medicaid Other

## 2018-05-02 VITALS — BP 115/80 | HR 106

## 2018-05-02 DIAGNOSIS — S90211A Contusion of right great toe with damage to nail, initial encounter: Secondary | ICD-10-CM | POA: Diagnosis not present

## 2018-05-02 DIAGNOSIS — S90111A Contusion of right great toe without damage to nail, initial encounter: Secondary | ICD-10-CM

## 2018-05-02 DIAGNOSIS — E1042 Type 1 diabetes mellitus with diabetic polyneuropathy: Secondary | ICD-10-CM

## 2018-05-02 DIAGNOSIS — T1490XA Injury, unspecified, initial encounter: Secondary | ICD-10-CM

## 2018-05-02 NOTE — Progress Notes (Signed)
Subjective:  Patient ID: Christine Dunn, female    DOB: Dec 21, 1990,  MRN: [id number]  Chief Complaint  Patient presents with  . Foot Injury    right top of foot, dropped a can of beans 1 day ago, now great toe is numb,swollen    27 y.o. female presents with the above complaint.  States she dropped a can of beans 1 day ago and the sharp edge of the can hit her toe.  States that she has some numbness and swelling in the foot and in the toe itself.  Denies other issues.  Review of Systems: Negative except as noted in the HPI. Denies N/V/F/Ch.  Past Medical History:  Diagnosis Date  . Anxiety   . Chronic pain of right knee 02/2013   followed by Orthopedist Dr. Eulah Pont  . Depression 12/25/2011  . Diabetes type 1, uncontrolled (HCC) 08/27/1997  . Diabetic neuropathy (HCC) 01/08/2012  . Fatty liver   . Foot deformity, acquired    little toes, toes nail are falling off, needs surgery, but cant happen till AIC < 9  . Frequent falls    due to weakness of bilateral LE  . Herpes   . History of retinopathy    L eye  . Hyperlipidemia LDL goal < 100 12/19/2011  . Insomnia 01/30/2012  . Neuropathy   . Superficial skin infection 12/18/2011   Recurrent for past 2 years     Current Outpatient Medications:  .  canagliflozin (INVOKANA) 100 MG TABS tablet, Take by mouth., Disp: , Rfl:  .  clonazePAM (KLONOPIN) 0.5 MG tablet, Take by mouth., Disp: , Rfl:  .  glucose blood test strip, ACCU-CHEK SMARTVIEW TEST STRIP Strp, Disp: , Rfl:  .  ibuprofen (ADVIL,MOTRIN) 600 MG tablet, Take 1 tablet (600 mg total) by mouth every 6 (six) hours., Disp: 30 tablet, Rfl: 0 .  insulin aspart (NOVOLOG) 100 UNIT/ML injection, Inject 5 Units into the skin 3 (three) times daily before meals. Novolog TID. The goal of CBG is 150. Patient is to take additional 1 unit with every 50 of cbgs above 150. She is to take additional 1 unit for every 3 CHO per patient report( sees endocrinologist Dr.Smith)., Disp: , Rfl:  .  insulin  glargine (LANTUS) 100 UNIT/ML injection, Inject into the skin., Disp: , Rfl:  .  meclizine (ANTIVERT) 25 MG tablet, Take by mouth., Disp: , Rfl:  .  medroxyPROGESTERone (DEPO-PROVERA) 150 MG/ML injection, Inject into the muscle., Disp: , Rfl:  .  neomycin-bacitracin-polymyxin (NEOSPORIN) ointment, Apply 1 application topically at bedtime. Apply to affected areas on arms, Disp: , Rfl:  .  nortriptyline (PAMELOR) 10 MG capsule, TAKE 2 CAPSULES (20 MG TOTAL) BY MOUTH NIGHTLY, Disp: , Rfl: 3 .  oxyCODONE-acetaminophen (PERCOCET/ROXICET) 5-325 MG tablet, Take 1-2 tablets by mouth every 4 (four) hours as needed for severe pain., Disp: 15 tablet, Rfl: 0 .  Prenatal Multivit-Min-Fe-FA (PRENATAL VITAMINS PO), Take 1 tablet by mouth daily. , Disp: , Rfl:  .  promethazine (PHENERGAN) 25 MG tablet, Take by mouth., Disp: , Rfl:  .  propranolol ER (INDERAL LA) 60 MG 24 hr capsule, Take by mouth., Disp: , Rfl:  .  sertraline (ZOLOFT) 100 MG tablet, Take 100 mg by mouth daily., Disp: , Rfl:  .  SUMAtriptan (IMITREX) 100 MG tablet, TAKE 1 TABLET (100 MG TOTAL) BY MOUTH ONCE AS NEEDED FOR MIGRAINE, Disp: , Rfl: 3 .  UNABLE TO FIND, blood sugar diagnostic -accu-chek guide strp, Disp: , Rfl:  .  valACYclovir (VALTREX) 500 MG tablet, Take 500 mg by mouth 2 (two) times daily., Disp: , Rfl:   Social History   Tobacco Use  Smoking Status Never Smoker  Smokeless Tobacco Never Used    Allergies  Allergen Reactions  . Penicillins Hives, Swelling, Other (See Comments) and Rash    Childhood reaction. Childhood reaction.  . Gabapentin Nausea Only    Dizziness Dizziness Dizziness   . Gabapentin (Once-Daily) Nausea Only    Dizziness   . Statins Other (See Comments)    Elevated liver enzymes. Elevated liver enzymes. Elevated liver enzymes.  . Pravastatin Sodium     Elevated liver enzymes.  . Rosuvastatin Calcium     Elevated liver enzymes.   Objective:   Vitals:   05/02/18 1459  BP: 115/80  Pulse: (!)  106   There is no height or weight on file to calculate BMI. Constitutional Well developed. Well nourished.  Vascular Dorsalis pedis pulses palpable bilaterally. Posterior tibial pulses palpable bilaterally. Capillary refill normal to all digits.  No cyanosis or clubbing noted. Pedal hair growth normal.  Neurologic Normal speech. Oriented to person, place, and time. Epicritic sensation to light touch grossly present bilaterally.  Sensation diminished over the first metatarsal and hallux area.  Dermatologic Nails well groomed and normal in appearance. No open wounds. Small superficial laceration dorsal aspect of the hallux  Orthopedic:  Pain on hallux range of motion.  Suboptimal effort to dorsiflex and plantarflex hallux but both extensors and flexors appear intact No visible deformities. No bony tenderness.   Radiographs: Taken reviewed.  No underlying osseous abnormalities.  No acute fracture dislocation Assessment:   1. Contusion of right great toe without damage to nail, initial encounter    Plan:  Patient was evaluated and treated and all questions answered.  Contusion of right great -Ace bandage applied -Educated that the symptoms are likely transient.  It does not appear that the laceration deep enough to cause any injury and her tendons do appear to be functioning but they are somewhat limited due to pain. -Ice pack dispensed.  Educated on icing  Return if symptoms worsen or fail to improve.

## 2018-05-03 ENCOUNTER — Other Ambulatory Visit (INDEPENDENT_AMBULATORY_CARE_PROVIDER_SITE_OTHER): Payer: Self-pay | Admitting: Orthopaedic Surgery

## 2018-05-05 NOTE — Telephone Encounter (Signed)
Please advise 

## 2018-06-13 DIAGNOSIS — E1042 Type 1 diabetes mellitus with diabetic polyneuropathy: Secondary | ICD-10-CM | POA: Insufficient documentation

## 2018-06-13 DIAGNOSIS — G43019 Migraine without aura, intractable, without status migrainosus: Secondary | ICD-10-CM | POA: Insufficient documentation

## 2018-07-07 ENCOUNTER — Other Ambulatory Visit (INDEPENDENT_AMBULATORY_CARE_PROVIDER_SITE_OTHER): Payer: Self-pay | Admitting: Orthopaedic Surgery

## 2018-09-29 ENCOUNTER — Ambulatory Visit (INDEPENDENT_AMBULATORY_CARE_PROVIDER_SITE_OTHER): Payer: Medicaid Other | Admitting: Podiatry

## 2018-09-29 ENCOUNTER — Encounter: Payer: Self-pay | Admitting: Podiatry

## 2018-09-29 DIAGNOSIS — E1042 Type 1 diabetes mellitus with diabetic polyneuropathy: Secondary | ICD-10-CM | POA: Diagnosis not present

## 2018-09-29 NOTE — Progress Notes (Signed)
She presents today for follow-up of her yearly diabetic foot exam.  She states that she still complaining of numbness to the third and fourth and fifth toes of the bilateral foot.  States that her blood sugars are still running around 9.0.  Objective: Vital signs are stable alert oriented x3.  Pulses are palpable.  There is no erythema edema cellulitis drainage or odor she has loss of sensation per Semmes Weinstein monofilament to the last 3 toes of the bilateral foot.  Assessment: Diabetic peripheral neuropathy with no other complications.  Plan: Follow-up with me in 1 year and I will encourage her to continue to decrease her blood sugar concentrations.

## 2019-09-28 ENCOUNTER — Ambulatory Visit: Payer: Medicaid Other | Admitting: Podiatry

## 2019-10-23 ENCOUNTER — Other Ambulatory Visit: Payer: Self-pay

## 2019-10-23 ENCOUNTER — Encounter: Payer: Self-pay | Admitting: Emergency Medicine

## 2019-10-23 ENCOUNTER — Emergency Department: Payer: Medicaid Other

## 2019-10-23 ENCOUNTER — Emergency Department
Admission: EM | Admit: 2019-10-23 | Discharge: 2019-10-23 | Disposition: A | Discharge status: Home / Self Care | Payer: Medicaid Other | Attending: Emergency Medicine | Admitting: Emergency Medicine

## 2019-10-23 DIAGNOSIS — R1031 Right lower quadrant pain: Secondary | ICD-10-CM | POA: Insufficient documentation

## 2019-10-23 DIAGNOSIS — Z79899 Other long term (current) drug therapy: Secondary | ICD-10-CM | POA: Diagnosis not present

## 2019-10-23 DIAGNOSIS — E109 Type 1 diabetes mellitus without complications: Secondary | ICD-10-CM | POA: Insufficient documentation

## 2019-10-23 DIAGNOSIS — I10 Essential (primary) hypertension: Secondary | ICD-10-CM | POA: Insufficient documentation

## 2019-10-23 DIAGNOSIS — R109 Unspecified abdominal pain: Secondary | ICD-10-CM

## 2019-10-23 LAB — COMPREHENSIVE METABOLIC PANEL
ALT: 12 U/L (ref 0–44)
AST: 17 U/L (ref 15–41)
Albumin: 4.6 g/dL (ref 3.5–5.0)
Alkaline Phosphatase: 54 U/L (ref 38–126)
Anion gap: 8 (ref 5–15)
BUN: 11 mg/dL (ref 6–20)
CO2: 27 mmol/L (ref 22–32)
Calcium: 9.3 mg/dL (ref 8.9–10.3)
Chloride: 103 mmol/L (ref 98–111)
Creatinine, Ser: 0.69 mg/dL (ref 0.44–1.00)
GFR calc Af Amer: >60 mL/min (ref >60)
GFR calc non Af Amer: >60 mL/min (ref >60)
Glucose, Bld: 125 mg/dL — ABNORMAL HIGH (ref 70–99)
Potassium: 3.9 mmol/L (ref 3.5–5.1)
Sodium: 138 mmol/L (ref 135–145)
Total Bilirubin: 0.9 mg/dL (ref 0.3–1.2)
Total Protein: 7.9 g/dL (ref 6.5–8.1)

## 2019-10-23 LAB — CBC
HCT: 44.2 % (ref 36.0–46.0)
Hemoglobin: 15.2 g/dL — ABNORMAL HIGH (ref 12.0–15.0)
MCH: 29.9 pg (ref 26.0–34.0)
MCHC: 34.4 g/dL (ref 30.0–36.0)
MCV: 87 fL (ref 80.0–100.0)
Platelets: 277 10*3/uL (ref 150–400)
RBC: 5.08 MIL/uL (ref 3.87–5.11)
RDW: 12.3 % (ref 11.5–15.5)
WBC: 5.9 10*3/uL (ref 4.0–10.5)
nRBC: 0 % (ref 0.0–0.2)

## 2019-10-23 LAB — URINALYSIS, COMPLETE (UACMP) WITH MICROSCOPIC
Bacteria, UA: NONE SEEN
Bilirubin Urine: NEGATIVE
Glucose, UA: NEGATIVE mg/dL
Hgb urine dipstick: NEGATIVE
Ketones, ur: NEGATIVE mg/dL
Leukocytes,Ua: NEGATIVE
Nitrite: NEGATIVE
Protein, ur: NEGATIVE mg/dL
Specific Gravity, Urine: 1.016 (ref 1.005–1.030)
pH: 5 (ref 5.0–8.0)

## 2019-10-23 LAB — POCT PREGNANCY, URINE: Preg Test, Ur: NEGATIVE

## 2019-10-23 MED ORDER — ONDANSETRON HCL 4 MG/2ML IJ SOLN
4.0000 mg | Freq: Once | INTRAMUSCULAR | Status: AC
  Administered 2019-10-23: 11:00:00 4 mg via INTRAVENOUS
  Filled 2019-10-23: qty 2

## 2019-10-23 MED ORDER — HYDROCODONE-ACETAMINOPHEN 5-325 MG PO TABS: 1.0000 | ORAL_TABLET | ORAL | 0 refills | Status: AC | PRN

## 2019-10-23 MED ORDER — FENTANYL CITRATE (PF) 100 MCG/2ML IJ SOLN
50.0000 ug | Freq: Once | INTRAMUSCULAR | Status: AC
  Administered 2019-10-23: 11:00:00 50 ug via INTRAVENOUS
  Filled 2019-10-23: qty 2

## 2019-10-23 MED ORDER — SODIUM CHLORIDE 0.9 % IV BOLUS
1000.0000 mL | Freq: Once | INTRAVENOUS | Status: AC
  Administered 2019-10-23: 1000 mL via INTRAVENOUS

## 2019-10-23 NOTE — ED Provider Notes (Signed)
[facility] Emergency Department Provider Note  Time seen: 10:31 AM  I have reviewed the triage vital signs and the nursing notes.   HISTORY  Chief Complaint Flank Pain   HPI Kennetta L Kerrigan is a 29 y.o. female with a past medical history of anxiety, diabetes, hyperlipidemia, presents to the emergency department for right flank pain.  According to the patient last night she developed acute onset of sharp pain in her right flank, states it has been fairly constant throughout the night and into this morning.  Patient denies any dysuria or hematuria but states she does not believe she has urinated since last night.  Denies any vaginal bleeding or discharge.  Denies any vomiting or diarrhea.  No fever.  Describes the pain as sharp 9/10 in severity.   Past Medical History:  Diagnosis Date  . Anxiety   . Chronic pain of right knee 02/2013   followed by Orthopedist Dr. Eulah Pont  . Depression 12/25/2011  . Diabetes type 1, uncontrolled (HCC) 08/27/1997  . Diabetic neuropathy (HCC) 01/08/2012  . Fatty liver   . Foot deformity, acquired    little toes, toes nail are falling off, needs surgery, but cant happen till AIC < 9  . Frequent falls    due to weakness of bilateral LE  . Herpes   . History of retinopathy    L eye  . Hyperlipidemia LDL goal < 100 12/19/2011  . Insomnia 01/30/2012  . Neuropathy   . Superficial skin infection 12/18/2011   Recurrent for past 2 years     Patient Active Problem List   Diagnosis Date Noted  . Intractable migraine without aura and without status migrainosus 06/13/2018  . Diabetic polyneuropathy associated with type 1 diabetes mellitus (HCC) 06/13/2018  . Vertigo 09/06/2017  . Chronic daily headache 04/16/2017  . PVC's (premature ventricular contractions) 04/16/2017  . Gestational hypertension 03/20/2017  . PIH (pregnancy induced hypertension) 03/20/2017  . Muscle weakness of extremity 04/04/2016  . Left foot pain 03/20/2016  . Weakness  of both legs 03/20/2016  . Insulin long-term use (HCC) 10/31/2015  . Disorder of skin and subcutaneous tissue 08/02/2014  . Abnormal LFTs 06/09/2014  . Absence of menstruation 06/09/2014  . Anxiety disorder 06/09/2014  . Depression 06/09/2014  . Acid reflux 06/09/2014  . Essential (primary) hypertension 06/09/2014  . Fatigue 06/09/2014  . Alopecia 06/09/2014  . Enlarged liver 06/09/2014  . Microalbuminuria 06/09/2014  . NASH (nonalcoholic steatohepatitis) 06/09/2014  . Awareness of heartbeats 06/09/2014  . Deafness, sensorineural 06/09/2014  . Diabetes mellitus type 1, uncontrolled (HCC) 06/09/2014  . Birth control 04/02/2014  . Paroxysmal supraventricular tachycardia (HCC) 03/05/2014  . Seborrheic dermatitis of scalp 02/20/2014  . Depression, major, recurrent (HCC) 06/09/2013  . Right knee injury 04/21/2013  . Diabetic retinopathy (HCC) 02/20/2013  . Diabetic retinitis (HCC) 02/20/2013  . Overweight (BMI 25.0-29.9) 11/20/2012  . Foot deformity, acquired   . Fatty liver   . Insomnia 01/30/2012  . Diabetic neuropathy (HCC) 01/08/2012  . Hyperlipemia 12/19/2011  . Prurigo nodularis 12/18/2011  . Controlled type 1 diabetes mellitus with mild nonproliferative retinopathy 08/27/1997    Past Surgical History:  Procedure Laterality Date  . CESAREAN SECTION N/A 03/20/2017   Procedure: CESAREAN SECTION;  Surgeon: Harold Hedge, MD;  Location: [facility] BIRTHING SUITES;  Service: Obstetrics;  Laterality: N/A;  . LEEP    . WISDOM TOOTH EXTRACTION  2015  . WRIST SURGERY Right 2010   rerouted tendons in wrist    Prior  to Admission medications   Medication Sig Start Date End Date Taking? Authorizing Provider  ALPRAZolam (XANAX) 1 MG tablet TAKE 1/2 TABLET BY MOUTH 2 TIMES A DAY AS NEEDED ANXIETY 08/13/18   [provider]  canagliflozin (INVOKANA) 100 MG TABS tablet Take by mouth. 06/25/14   [provider]  clonazePAM (KLONOPIN) 0.5 MG tablet Take by mouth. 06/18/14    [provider]  glucose blood test strip ACCU-CHEK SMARTVIEW TEST STRIP Strp 06/12/17   [provider]  ibuprofen (ADVIL,MOTRIN) 600 MG tablet Take 1 tablet (600 mg total) by mouth every 6 (six) hours. 03/23/17   Zelphia Cairo, MD  ibuprofen (ADVIL,MOTRIN) 800 MG tablet TAKE 1 TABLET BY MOUTH 2-3 TIMES DAILY WITH FOOD AS NEEDED FOR PAIN/INFLAMMATION 07/08/18   Kathryne Hitch, MD  insulin aspart (NOVOLOG) 100 UNIT/ML injection Inject 5 Units into the skin 3 (three) times daily before meals. Novolog TID. The goal of CBG is 150. Patient is to take additional 1 unit with every 50 of cbgs above 150. She is to take additional 1 unit for every 3 CHO per patient report( sees endocrinologist Dr.Smith). 10/30/12   Dede Query, MD  insulin glargine (LANTUS) 100 UNIT/ML injection Inject into the skin. 01/08/12   [provider]  LATUDA 40 MG TABS tablet Take 40 mg by mouth daily. 08/13/18   [provider]  meclizine (ANTIVERT) 25 MG tablet Take by mouth. 10/16/16   [provider]  nortriptyline (PAMELOR) 10 MG capsule TAKE 2 CAPSULES (20 MG TOTAL) BY MOUTH NIGHTLY 02/28/18   [provider]  Prenatal Multivit-Min-Fe-FA (PRENATAL VITAMINS PO) Take 1 tablet by mouth daily.     [provider]  promethazine (PHENERGAN) 25 MG tablet Take by mouth. 03/10/18   [provider]  propranolol ER (INDERAL LA) 60 MG 24 hr capsule Take by mouth. 04/30/17   [provider]  REXULTI 2 MG TABS Take 1 tablet by mouth daily. 07/21/18   [provider]  sertraline (ZOLOFT) 100 MG tablet Take 100 mg by mouth daily.    [provider]  SUMAtriptan (IMITREX) 100 MG tablet TAKE 1 TABLET (100 MG TOTAL) BY MOUTH ONCE AS NEEDED FOR MIGRAINE 03/11/18   [provider]  UNABLE TO FIND blood sugar diagnostic -accu-chek guide strp 07/12/17   [provider]  valACYclovir (VALTREX) 500 MG tablet Take 500 mg by mouth 2 (two)  times daily.    [provider]  VIIBRYD 40 MG TABS Take 40 mg by mouth daily. 09/21/18   [provider]    Allergies  Allergen Reactions  . Penicillins Hives, Swelling, Other (See Comments) and Rash    Childhood reaction. Childhood reaction.  . Gabapentin Nausea Only    Dizziness Dizziness Dizziness   . Gabapentin (Once-Daily) Nausea Only    Dizziness   . Statins Other (See Comments)    Elevated liver enzymes. Elevated liver enzymes. Elevated liver enzymes.  . Pravastatin Sodium     Elevated liver enzymes.  . Rosuvastatin Calcium     Elevated liver enzymes.    Family History  Problem Relation Age of Onset  . Diabetes Maternal Grandfather   . Heart disease Maternal Grandfather   . Lymphoma Maternal Grandfather   . Hypertension Maternal Grandfather   . COPD Maternal Grandfather   . Parkinson's disease Maternal Grandfather   . Lung cancer Maternal Grandfather   . Depression Mother   . Irritable bowel syndrome Mother   . Gestational diabetes Mother   .  Hyperlipidemia Father   . Hypertension Father   . Thyroid disease Father   . Alcohol abuse Brother   . Asthma Brother   . Drug abuse Brother   . Lung cancer Maternal Uncle   . COPD Maternal Grandmother     Social History Social History   Tobacco Use  . Smoking status: Never Smoker  . Smokeless tobacco: Never Used  Substance Use Topics  . Alcohol use: No    Alcohol/week: 0.0 standard drinks  . Drug use: No    Review of Systems Constitutional: Negative for fever. Cardiovascular: Negative for chest pain. Respiratory: Negative for shortness of breath. Gastrointestinal: Right flank pain.  Negative for nausea vomiting or diarrhea. Genitourinary: Negative for dysuria or hematuria Musculoskeletal: Negative for musculoskeletal complaints Neurological: Negative for headache All other ROS negative  ____________________________________________   PHYSICAL EXAM:  VITAL SIGNS: ED Triage  Vitals  Enc Vitals Group     BP 10/23/19 1021 (!) 151/92     Pulse Rate 10/23/19 1021 (!) 117     Resp 10/23/19 1021 16     Temp 10/23/19 1021 98.2 F (36.8 C)     Temp Source 10/23/19 1021 Oral     SpO2 10/23/19 1021 98 %     Weight 10/23/19 1021 137 lb (62.1 kg)     Height 10/23/19 1021 5\' 4"  (1.626 m)     Head Circumference --      Peak Flow --      Pain Score 10/23/19 1020 9     Pain Loc --      Pain Edu? --      Excl. in [place]? --    Constitutional: Alert and oriented. Well appearing and in no distress. Eyes: Normal exam ENT      Head: Normocephalic and atraumatic.      Mouth/Throat: Mucous membranes are moist. Cardiovascular: Normal rate, regular rhythm.  Respiratory: Normal respiratory effort without tachypnea nor retractions. Breath sounds are clear Gastrointestinal: Soft, slight tenderness to palpation in the right mid abdomen.  The lower abdomen/pelvis area is nontender. Musculoskeletal: Nontender with normal range of motion in all extremities. No lower extremity tenderness or edema. Neurologic:  Normal speech and language. No gross focal neurologic deficits are appreciated. Skin:  Skin is warm, dry and intact.  Psychiatric: Mood and affect are normal. Speech and behavior are normal.   ____________________________________________   RADIOLOGY  CT scan essentially negative.  ____________________________________________   INITIAL IMPRESSION / ASSESSMENT AND PLAN / ED COURSE  Pertinent labs & imaging results that were available during my care of the patient were reviewed by me and considered in my medical decision making (see chart for details).   Patient presents to the emergency department for right mid abdominal pain/right flank pain since 11 PM was a last night.  9/10 sharp pain.  Differential would include appendicitis, ureterolithiasis, urinary tract infection/pyelonephritis, ovarian cyst, less likely colitis or diverticulitis.  We will check labs, urinalysis,  CT scan and continue to closely monitor.  Patient agreeable plan of care.  States 9/10 pain would like pain medication.  CT scan is essentially negative.  Lab work is reassuring.  Patient continues to have mild pain in the right flank although improved from presentation.  We will place the patient on a very short course of pain medication and have her follow-up with her doctor.  I discussed return precautions with the patient.  Jackline L Kaucher was evaluated in Emergency Department on 10/23/2019 for the symptoms described in the  history of present illness. She was evaluated in the context of the global COVID-19 pandemic, which necessitated consideration that the patient might be at risk for infection with the SARS-CoV-2 virus that causes COVID-19. Institutional protocols and algorithms that pertain to the evaluation of patients at risk for COVID-19 are in a state of rapid change based on information released by regulatory bodies including the CDC and federal and state organizations. These policies and algorithms were followed during the patient's care in the ED.  ____________________________________________   FINAL CLINICAL IMPRESSION(S) / ED DIAGNOSES  Right flank pain   Minna Antis, MD 10/23/19 1305

## 2019-10-23 NOTE — ED Triage Notes (Signed)
Right side pain x 1 day.  Describes pain as sharp.  States today decreased appetite.  Also decreased urine output today.  Also c/o nausea

## 2019-11-19 ENCOUNTER — Other Ambulatory Visit: Payer: Self-pay

## 2019-11-19 ENCOUNTER — Emergency Department
Admission: EM | Admit: 2019-11-19 | Discharge: 2019-11-19 | Disposition: A | Discharge status: Home / Self Care | Payer: Medicaid Other | Attending: Emergency Medicine | Admitting: Emergency Medicine

## 2019-11-19 DIAGNOSIS — E109 Type 1 diabetes mellitus without complications: Secondary | ICD-10-CM | POA: Diagnosis not present

## 2019-11-19 DIAGNOSIS — K529 Noninfective gastroenteritis and colitis, unspecified: Secondary | ICD-10-CM

## 2019-11-19 DIAGNOSIS — R1110 Vomiting, unspecified: Secondary | ICD-10-CM | POA: Diagnosis present

## 2019-11-19 LAB — COMPREHENSIVE METABOLIC PANEL
ALT: 13 U/L (ref 0–44)
AST: 19 U/L (ref 15–41)
Albumin: 5 g/dL (ref 3.5–5.0)
Alkaline Phosphatase: 52 U/L (ref 38–126)
Anion gap: 14 (ref 5–15)
BUN: 23 mg/dL — ABNORMAL HIGH (ref 6–20)
CO2: 23 mmol/L (ref 22–32)
Calcium: 9.6 mg/dL (ref 8.9–10.3)
Chloride: 102 mmol/L (ref 98–111)
Creatinine, Ser: 0.98 mg/dL (ref 0.44–1.00)
GFR calc Af Amer: >60 mL/min (ref >60)
GFR calc non Af Amer: >60 mL/min (ref >60)
Glucose, Bld: 223 mg/dL — ABNORMAL HIGH (ref 70–99)
Potassium: 4.6 mmol/L (ref 3.5–5.1)
Sodium: 139 mmol/L (ref 135–145)
Total Bilirubin: 1.5 mg/dL — ABNORMAL HIGH (ref 0.3–1.2)
Total Protein: 8.8 g/dL — ABNORMAL HIGH (ref 6.5–8.1)

## 2019-11-19 LAB — URINALYSIS, COMPLETE (UACMP) WITH MICROSCOPIC
Bilirubin Urine: NEGATIVE
Glucose, UA: 50 mg/dL — AB
Hgb urine dipstick: NEGATIVE
Ketones, ur: 80 mg/dL — AB
Leukocytes,Ua: NEGATIVE
Nitrite: NEGATIVE
Protein, ur: 30 mg/dL — AB
Specific Gravity, Urine: 1.023 (ref 1.005–1.030)
pH: 5 (ref 5.0–8.0)

## 2019-11-19 LAB — HCG, QUANTITATIVE, PREGNANCY: hCG, Beta Chain, Quant, S: <1 m[IU]/mL (ref <5)

## 2019-11-19 LAB — CBC WITH DIFFERENTIAL/PLATELET
Abs Immature Granulocytes: 0.09 10*3/uL — ABNORMAL HIGH (ref 0.00–0.07)
Basophils Absolute: 0 10*3/uL (ref 0.0–0.1)
Basophils Relative: 0 %
Eosinophils Absolute: 0 10*3/uL (ref 0.0–0.5)
Eosinophils Relative: 0 %
HCT: 46.7 % — ABNORMAL HIGH (ref 36.0–46.0)
Hemoglobin: 16.7 g/dL — ABNORMAL HIGH (ref 12.0–15.0)
Immature Granulocytes: 1 %
Lymphocytes Relative: 2 %
Lymphs Abs: 0.4 10*3/uL — ABNORMAL LOW (ref 0.7–4.0)
MCH: 30.4 pg (ref 26.0–34.0)
MCHC: 35.8 g/dL (ref 30.0–36.0)
MCV: 85.1 fL (ref 80.0–100.0)
Monocytes Absolute: 0.6 10*3/uL (ref 0.1–1.0)
Monocytes Relative: 3 %
Neutro Abs: 17.2 10*3/uL — ABNORMAL HIGH (ref 1.7–7.7)
Neutrophils Relative %: 94 %
Platelets: 265 10*3/uL (ref 150–400)
RBC: 5.49 MIL/uL — ABNORMAL HIGH (ref 3.87–5.11)
RDW: 12.5 % (ref 11.5–15.5)
WBC: 18.2 10*3/uL — ABNORMAL HIGH (ref 4.0–10.5)
nRBC: 0 % (ref 0.0–0.2)

## 2019-11-19 LAB — LIPASE, BLOOD: Lipase: 14 U/L (ref 11–51)

## 2019-11-19 MED ORDER — ONDANSETRON 4 MG PO TBDP: 4.0000 mg | ORAL_TABLET | Freq: Three times a day (TID) | ORAL | 0 refills | Status: AC | PRN

## 2019-11-19 MED ORDER — SODIUM CHLORIDE 0.9 % IV SOLN: Freq: Once | INTRAVENOUS | Status: AC

## 2019-11-19 MED ORDER — ONDANSETRON HCL 4 MG/2ML IJ SOLN
4.0000 mg | Freq: Once | INTRAMUSCULAR | Status: AC
  Administered 2019-11-19: 4 mg via INTRAVENOUS
  Filled 2019-11-19: qty 2

## 2019-11-19 MED FILL — ONDANSETRON ODT 4 MG TABLET: 4 | 7 days supply | Qty: 20 | Fill #0

## 2019-11-19 NOTE — ED Notes (Signed)
Pt reports last blood glucose at 0100, of 100. Pt reports t1 diabetes, wearing pump currently

## 2019-11-19 NOTE — ED Triage Notes (Signed)
Pt in with co n.v.d since 2100 last night. Pt not able to keep fluids down, no co pain.

## 2019-11-19 NOTE — ED Notes (Signed)
Esign not working at this time. Pt verbalized discharge instructions and has no questions at this time. 

## 2019-11-19 NOTE — ED Provider Notes (Signed)
[facility] Emergency Department Provider Note       Time seen: ----------------------------------------- 6:58 AM on 11/19/2019 -----------------------------------------   I have reviewed the triage vital signs and the nursing notes.  HISTORY   Chief Complaint Emesis    HPI Christine Dunn is a 29 y.o. female with a history of anxiety, depression, diabetes who presents to the ED for nausea, vomiting and diarrhea since 2100 last night.  Patient reports not being able to keep any fluids down.  She does not complain of any pain.  Past Medical History:  Diagnosis Date  . Anxiety   . Chronic pain of right knee 02/2013   followed by Orthopedist Dr. Eulah Pont  . Depression 12/25/2011  . Diabetes type 1, uncontrolled (HCC) 08/27/1997  . Diabetic neuropathy (HCC) 01/08/2012  . Fatty liver   . Foot deformity, acquired    little toes, toes nail are falling off, needs surgery, but cant happen till AIC < 9  . Frequent falls    due to weakness of bilateral LE  . Herpes   . History of retinopathy    L eye  . Hyperlipidemia LDL goal < 100 12/19/2011  . Insomnia 01/30/2012  . Neuropathy   . Superficial skin infection 12/18/2011   Recurrent for past 2 years     Patient Active Problem List   Diagnosis Date Noted  . Intractable migraine without aura and without status migrainosus 06/13/2018  . Diabetic polyneuropathy associated with type 1 diabetes mellitus (HCC) 06/13/2018  . Vertigo 09/06/2017  . Chronic daily headache 04/16/2017  . PVC's (premature ventricular contractions) 04/16/2017  . Gestational hypertension 03/20/2017  . PIH (pregnancy induced hypertension) 03/20/2017  . Muscle weakness of extremity 04/04/2016  . Left foot pain 03/20/2016  . Weakness of both legs 03/20/2016  . Insulin long-term use (HCC) 10/31/2015  . Disorder of skin and subcutaneous tissue 08/02/2014  . Abnormal LFTs 06/09/2014  . Absence of menstruation 06/09/2014  . Anxiety disorder  06/09/2014  . Depression 06/09/2014  . Acid reflux 06/09/2014  . Essential (primary) hypertension 06/09/2014  . Fatigue 06/09/2014  . Alopecia 06/09/2014  . Enlarged liver 06/09/2014  . Microalbuminuria 06/09/2014  . NASH (nonalcoholic steatohepatitis) 06/09/2014  . Awareness of heartbeats 06/09/2014  . Deafness, sensorineural 06/09/2014  . Diabetes mellitus type 1, uncontrolled (HCC) 06/09/2014  . Birth control 04/02/2014  . Paroxysmal supraventricular tachycardia (HCC) 03/05/2014  . Seborrheic dermatitis of scalp 02/20/2014  . Depression, major, recurrent (HCC) 06/09/2013  . Right knee injury 04/21/2013  . Diabetic retinopathy (HCC) 02/20/2013  . Diabetic retinitis (HCC) 02/20/2013  . Overweight (BMI 25.0-29.9) 11/20/2012  . Foot deformity, acquired   . Fatty liver   . Insomnia 01/30/2012  . Diabetic neuropathy (HCC) 01/08/2012  . Hyperlipemia 12/19/2011  . Prurigo nodularis 12/18/2011  . Controlled type 1 diabetes mellitus with mild nonproliferative retinopathy 08/27/1997    Past Surgical History:  Procedure Laterality Date  . CESAREAN SECTION N/A 03/20/2017   Procedure: CESAREAN SECTION;  Surgeon: Harold Hedge, MD;  Location: [facility] BIRTHING SUITES;  Service: Obstetrics;  Laterality: N/A;  . LEEP    . WISDOM TOOTH EXTRACTION  2015  . WRIST SURGERY Right 2010   rerouted tendons in wrist    Allergies Penicillins, Gabapentin, Gabapentin (once-daily), Statins, Pravastatin sodium, and Rosuvastatin calcium  Social History Social History   Tobacco Use  . Smoking status: Never Smoker  . Smokeless tobacco: Never Used  Substance Use Topics  . Alcohol use: No    Alcohol/week:  0.0 standard drinks  . Drug use: No    Review of Systems Constitutional: Negative for fever. Cardiovascular: Negative for chest pain. Respiratory: Negative for shortness of breath. Gastrointestinal: Negative for abdominal pain, positive for vomiting and diarrhea Musculoskeletal: Negative for back  pain. Skin: Negative for rash. Neurological: Negative for headaches, focal weakness or numbness.  All systems negative/normal/unremarkable except as stated in the HPI  ____________________________________________   PHYSICAL EXAM:  VITAL SIGNS: ED Triage Vitals  Enc Vitals Group     BP 11/19/19 0635 100/69     Pulse Rate 11/19/19 0635 (!) 121     Resp 11/19/19 0635 20     Temp 11/19/19 0635 98.8 F (37.1 C)     Temp Source 11/19/19 0635 Oral     SpO2 11/19/19 0635 100 %     Weight 11/19/19 0633 137 lb (62.1 kg)     Height 11/19/19 0633 5\' 3"  (1.6 m)     Head Circumference --      Peak Flow --      Pain Score 11/19/19 0633 0     Pain Loc --      Pain Edu? --      Excl. in [place]? --     Constitutional: Alert and oriented. Well appearing and in no distress. Eyes: Conjunctivae are normal. Normal extraocular movements. Cardiovascular: Normal rate, regular rhythm. No murmurs, rubs, or gallops. Respiratory: Normal respiratory effort without tachypnea nor retractions. Breath sounds are clear and equal bilaterally. No wheezes/rales/rhonchi. Gastrointestinal: Soft and nontender. Normal bowel sounds Musculoskeletal: Nontender with normal range of motion in extremities. No lower extremity tenderness nor edema. Neurologic:  Normal speech and language. No gross focal neurologic deficits are appreciated.  Skin:  Skin is warm, dry and intact. No rash noted. Psychiatric: Mood and affect are normal. Speech and behavior are normal.  ____________________________________________  ED COURSE:  As part of my medical decision making, I reviewed the following data within the [place] History obtained from family if available, nursing notes, old chart and ekg, as well as notes from prior ED visits. Patient presented for nausea, vomiting and diarrhea, we will assess with labs and imaging as indicated at this time. Clinical Course as of Nov 18 956  Thu Nov 19, 2019  0856 Patient  reports she is feeling better, no abdominal tenderness.   [JW]    Clinical Course User Index [JW] Earleen Newport, MD   Procedures  Jamala L Muegge was evaluated in Emergency Department on 11/19/2019 for the symptoms described in the history of present illness. She was evaluated in the context of the global COVID-19 pandemic, which necessitated consideration that the patient might be at risk for infection with the SARS-CoV-2 virus that causes COVID-19. Institutional protocols and algorithms that pertain to the evaluation of patients at risk for COVID-19 are in a state of rapid change based on information released by regulatory bodies including the CDC and federal and state organizations. These policies and algorithms were followed during the patient's care in the ED.  ____________________________________________   LABS (pertinent positives/negatives)  Labs Reviewed  CBC WITH DIFFERENTIAL/PLATELET - Abnormal; Notable for the following components:      Result Value   WBC 18.2 (*)    RBC 5.49 (*)    Hemoglobin 16.7 (*)    HCT 46.7 (*)    Neutro Abs 17.2 (*)    Lymphs Abs 0.4 (*)    Abs Immature Granulocytes 0.09 (*)    All other components  within normal limits  COMPREHENSIVE METABOLIC PANEL - Abnormal; Notable for the following components:   Glucose, Bld 223 (*)    BUN 23 (*)    Total Protein 8.8 (*)    Total Bilirubin 1.5 (*)    All other components within normal limits  LIPASE, BLOOD  HCG, QUANTITATIVE, PREGNANCY  URINALYSIS, COMPLETE (UACMP) WITH MICROSCOPIC   ____________________________________________   DIFFERENTIAL DIAGNOSIS   Gastritis, gastroenteritis, dehydration, electrolyte abnormality, pregnancy  FINAL ASSESSMENT AND PLAN  Gastroenteritis   Plan: The patient had presented for vomiting and diarrhea.  Patient was given IV fluids here as well as IV antiemetics.  Patient's labs did reveal significant leukocytosis which I think is a combination of acute GI illness  and stress response. She has never had any abdominal pain and has tolerated p.o. well after fluids and antiemetics. She be discharged with antiemetics.   Ulice Dash, MD    Note: This note was generated in part or whole with voice recognition software. Voice recognition is usually quite accurate but there are transcription errors that can and very often do occur. I apologize for any typographical errors that were not detected and corrected.     Emily Filbert, MD 11/19/19 [phone]

## 2019-11-20 ENCOUNTER — Ambulatory Visit: Payer: Medicaid Other

## 2019-11-27 ENCOUNTER — Ambulatory Visit: Payer: Medicaid Other | Attending: Internal Medicine

## 2019-11-27 DIAGNOSIS — Z23 Encounter for immunization: Secondary | ICD-10-CM

## 2019-11-27 NOTE — Progress Notes (Signed)
   Covid-19 Vaccination Clinic  Name:  Leeba L Frankland    MRN: [id number] DOB: 1991-04-21  11/27/2019  Ms. Schaaf was observed post Covid-19 immunization for 15 minutes without incident. She was provided with Vaccine Information Sheet and instruction to access the V-Safe system.   Ms. Marrufo was instructed to call 911 with any severe reactions post vaccine: Marland Kitchen Difficulty breathing  . Swelling of face and throat  . A fast heartbeat  . A bad rash all over body  . Dizziness and weakness   Immunizations Administered    Name Date Dose VIS Date Route   Pfizer COVID-19 Vaccine 11/27/2019  9:26 AM 0.3 mL 08/07/2019 Intramuscular   Manufacturer: [employer], [employer]   Lot: [id number]   NDC: [address]-1

## 2019-12-22 ENCOUNTER — Ambulatory Visit: Payer: Medicaid Other | Attending: Internal Medicine

## 2019-12-22 DIAGNOSIS — Z23 Encounter for immunization: Secondary | ICD-10-CM

## 2019-12-22 NOTE — Progress Notes (Signed)
   Covid-19 Vaccination Clinic  Name:  Skyley L Clagett    MRN: [id number] DOB: [birth year]  12/22/2019  Ms. Bearse was observed post Covid-19 immunization for 15 minutes without incident. She was provided with Vaccine Information Sheet and instruction to access the V-Safe system.   Ms. Osias was instructed to call 911 with any severe reactions post vaccine: Marland Kitchen Difficulty breathing  . Swelling of face and throat  . A fast heartbeat  . A bad rash all over body  . Dizziness and weakness   Immunizations Administered    Name Date Dose VIS Date Route   Pfizer COVID-19 Vaccine 12/22/2019 11:20 AM 0.3 mL 10/21/2018 Intramuscular   Manufacturer: [employer], [employer]   Lot: [id number]   NDC: [address]-4

## 2021-02-15 ENCOUNTER — Other Ambulatory Visit: Payer: Self-pay | Admitting: Obstetrics and Gynecology

## 2021-02-15 DIAGNOSIS — R1031 Right lower quadrant pain: Secondary | ICD-10-CM

## 2021-03-22 ENCOUNTER — Other Ambulatory Visit: Payer: Self-pay

## 2021-03-22 ENCOUNTER — Ambulatory Visit: Admission: RE | Admit: 2021-03-22 | Payer: Medicaid Other | Source: Ambulatory Visit

## 2021-03-22 ENCOUNTER — Ambulatory Visit
Admission: RE | Admit: 2021-03-22 | Discharge: 2021-03-22 | Disposition: A | Discharge status: Home / Self Care | Payer: Medicaid Other | Source: Ambulatory Visit | Attending: Obstetrics and Gynecology | Admitting: Obstetrics and Gynecology

## 2021-03-22 DIAGNOSIS — R1031 Right lower quadrant pain: Secondary | ICD-10-CM

## 2021-03-22 MED ORDER — GADOBENATE DIMEGLUMINE 529 MG/ML IV SOLN
16.0000 mL | Freq: Once | INTRAVENOUS | Status: AC | PRN
  Administered 2021-03-22: 16 mL via INTRAVENOUS

## 2021-04-10 ENCOUNTER — Other Ambulatory Visit: Payer: Self-pay | Admitting: Obstetrics and Gynecology

## 2021-04-10 DIAGNOSIS — R1031 Right lower quadrant pain: Secondary | ICD-10-CM

## 2021-04-24 ENCOUNTER — Encounter: Payer: Self-pay | Admitting: Physician Assistant

## 2021-05-17 ENCOUNTER — Ambulatory Visit: Payer: Medicaid Other | Admitting: Physician Assistant

## 2021-08-10 ENCOUNTER — Other Ambulatory Visit: Payer: Medicaid Other

## 2021-12-11 ENCOUNTER — Other Ambulatory Visit (HOSPITAL_COMMUNITY): Payer: Self-pay

## 2021-12-12 ENCOUNTER — Other Ambulatory Visit (HOSPITAL_COMMUNITY): Payer: Self-pay

## 2021-12-12 MED ORDER — ORILISSA 150 MG PO TABS
150.0000 mg | ORAL_TABLET | Freq: Every day | ORAL | 11 refills | Status: DC
  Filled 2021-12-12: qty 28, 28d supply, fill #0
  Filled 2022-01-08: qty 28, 28d supply, fill #1
  Filled 2022-02-03: qty 28, 28d supply, fill #2
  Filled 2022-03-03 – 2022-03-16 (×2): qty 28, 28d supply, fill #3
  Filled 2022-04-12: qty 28, 28d supply, fill #4
  Filled 2022-05-27: qty 28, 28d supply, fill #5
  Filled 2022-08-03 – 2022-11-27 (×2): qty 28, 28d supply, fill #6

## 2021-12-13 ENCOUNTER — Other Ambulatory Visit (HOSPITAL_COMMUNITY): Payer: Self-pay

## 2021-12-14 ENCOUNTER — Other Ambulatory Visit (HOSPITAL_COMMUNITY): Payer: Self-pay

## 2022-01-09 ENCOUNTER — Other Ambulatory Visit (HOSPITAL_COMMUNITY): Payer: Self-pay

## 2022-02-05 ENCOUNTER — Other Ambulatory Visit (HOSPITAL_COMMUNITY): Payer: Self-pay

## 2022-03-01 ENCOUNTER — Other Ambulatory Visit (HOSPITAL_COMMUNITY): Payer: Self-pay

## 2022-03-01 MED ORDER — HYDROCODONE-ACETAMINOPHEN 5-325 MG PO TABS
1.0000 | ORAL_TABLET | Freq: Four times a day (QID) | ORAL | 0 refills | Status: DC | PRN
  Filled 2022-03-01: qty 20, 5d supply, fill #0

## 2022-03-05 ENCOUNTER — Other Ambulatory Visit (HOSPITAL_COMMUNITY): Payer: Self-pay

## 2022-03-06 ENCOUNTER — Other Ambulatory Visit (HOSPITAL_COMMUNITY): Payer: Self-pay

## 2022-03-16 ENCOUNTER — Other Ambulatory Visit (HOSPITAL_COMMUNITY): Payer: Self-pay

## 2022-04-03 ENCOUNTER — Other Ambulatory Visit (HOSPITAL_COMMUNITY): Payer: Self-pay

## 2022-04-03 MED ORDER — VYVANSE 60 MG PO CAPS: 60.0000 mg | ORAL_CAPSULE | Freq: Every morning | ORAL | 0 refills | Status: AC

## 2022-04-03 MED ORDER — VYVANSE 60 MG PO CAPS
60.0000 mg | ORAL_CAPSULE | Freq: Every morning | ORAL | 0 refills | Status: AC
  Filled 2022-05-27 – 2022-09-01 (×3): qty 30, 30d supply, fill #0

## 2022-04-03 MED ORDER — VYVANSE 60 MG PO CAPS
60.0000 mg | ORAL_CAPSULE | Freq: Every morning | ORAL | 0 refills | Status: AC
  Filled 2022-04-03 – 2022-04-24 (×4): qty 30, 30d supply, fill #0

## 2022-04-12 ENCOUNTER — Other Ambulatory Visit (HOSPITAL_COMMUNITY): Payer: Self-pay

## 2022-04-23 ENCOUNTER — Other Ambulatory Visit (HOSPITAL_COMMUNITY): Payer: Self-pay

## 2022-04-24 ENCOUNTER — Other Ambulatory Visit (HOSPITAL_COMMUNITY): Payer: Self-pay

## 2022-05-28 ENCOUNTER — Other Ambulatory Visit (HOSPITAL_COMMUNITY): Payer: Self-pay

## 2022-08-03 ENCOUNTER — Other Ambulatory Visit (HOSPITAL_COMMUNITY): Payer: Self-pay

## 2022-08-06 ENCOUNTER — Other Ambulatory Visit (HOSPITAL_COMMUNITY): Payer: Self-pay

## 2022-08-17 ENCOUNTER — Other Ambulatory Visit (HOSPITAL_COMMUNITY): Payer: Self-pay

## 2022-09-03 ENCOUNTER — Other Ambulatory Visit (HOSPITAL_COMMUNITY): Payer: Self-pay

## 2022-10-13 ENCOUNTER — Other Ambulatory Visit (HOSPITAL_COMMUNITY): Payer: Self-pay

## 2022-11-27 ENCOUNTER — Other Ambulatory Visit (HOSPITAL_COMMUNITY): Payer: Self-pay

## 2022-11-27 ENCOUNTER — Other Ambulatory Visit: Payer: Self-pay

## 2022-11-28 ENCOUNTER — Other Ambulatory Visit (HOSPITAL_COMMUNITY): Payer: Self-pay

## 2022-11-28 ENCOUNTER — Other Ambulatory Visit: Payer: Self-pay

## 2022-12-10 ENCOUNTER — Other Ambulatory Visit (HOSPITAL_COMMUNITY): Payer: Self-pay

## 2023-01-01 ENCOUNTER — Other Ambulatory Visit (HOSPITAL_COMMUNITY): Payer: Self-pay

## 2023-07-23 ENCOUNTER — Other Ambulatory Visit (HOSPITAL_COMMUNITY): Payer: Self-pay

## 2023-07-23 MED ORDER — LISDEXAMFETAMINE DIMESYLATE 60 MG PO CAPS
60.0000 mg | ORAL_CAPSULE | Freq: Every morning | ORAL | 0 refills | Status: AC
  Filled 2023-07-23: qty 30, 30d supply, fill #0

## 2023-07-23 MED ORDER — ORILISSA 150 MG PO TABS
1.0000 | ORAL_TABLET | Freq: Every day | ORAL | 11 refills | Status: AC
  Filled 2023-07-23: qty 28, 28d supply, fill #0

## 2023-08-01 ENCOUNTER — Other Ambulatory Visit (HOSPITAL_COMMUNITY): Payer: Self-pay

## 2023-08-21 ENCOUNTER — Other Ambulatory Visit (HOSPITAL_COMMUNITY): Payer: Self-pay

## 2023-08-26 ENCOUNTER — Encounter (HOSPITAL_COMMUNITY): Payer: Self-pay

## 2023-08-26 ENCOUNTER — Other Ambulatory Visit (HOSPITAL_COMMUNITY): Payer: Self-pay
